# Patient Record
Sex: Female | Born: 1950
Health system: Southern US, Community
[De-identification: ages and names within clinical notes are randomized; demographics above are authoritative.]

## PROBLEM LIST (undated history)

## (undated) DIAGNOSIS — S4290XA Fracture of unspecified shoulder girdle, part unspecified, initial encounter for closed fracture: Secondary | ICD-10-CM

## (undated) DIAGNOSIS — Z8742 Personal history of other diseases of the female genital tract: Secondary | ICD-10-CM

## (undated) DIAGNOSIS — M719 Bursopathy, unspecified: Secondary | ICD-10-CM

## (undated) DIAGNOSIS — E785 Hyperlipidemia, unspecified: Secondary | ICD-10-CM

## (undated) DIAGNOSIS — M1990 Unspecified osteoarthritis, unspecified site: Secondary | ICD-10-CM

## (undated) DIAGNOSIS — R1310 Dysphagia, unspecified: Secondary | ICD-10-CM

## (undated) DIAGNOSIS — G709 Myoneural disorder, unspecified: Secondary | ICD-10-CM

## (undated) DIAGNOSIS — Z905 Acquired absence of kidney: Secondary | ICD-10-CM

## (undated) DIAGNOSIS — I10 Essential (primary) hypertension: Secondary | ICD-10-CM

## (undated) DIAGNOSIS — M797 Fibromyalgia: Secondary | ICD-10-CM

## (undated) DIAGNOSIS — R0600 Dyspnea, unspecified: Secondary | ICD-10-CM

## (undated) DIAGNOSIS — G43909 Migraine, unspecified, not intractable, without status migrainosus: Secondary | ICD-10-CM

## (undated) DIAGNOSIS — G629 Polyneuropathy, unspecified: Secondary | ICD-10-CM

## (undated) DIAGNOSIS — I998 Other disorder of circulatory system: Secondary | ICD-10-CM

## (undated) DIAGNOSIS — F909 Attention-deficit hyperactivity disorder, unspecified type: Secondary | ICD-10-CM

## (undated) DIAGNOSIS — C801 Malignant (primary) neoplasm, unspecified: Secondary | ICD-10-CM

## (undated) DIAGNOSIS — K760 Fatty (change of) liver, not elsewhere classified: Secondary | ICD-10-CM

## (undated) DIAGNOSIS — I6381 Other cerebral infarction due to occlusion or stenosis of small artery: Secondary | ICD-10-CM

## (undated) DIAGNOSIS — Z86718 Personal history of other venous thrombosis and embolism: Secondary | ICD-10-CM

## (undated) DIAGNOSIS — E669 Obesity, unspecified: Secondary | ICD-10-CM

## (undated) DIAGNOSIS — J189 Pneumonia, unspecified organism: Secondary | ICD-10-CM

## (undated) DIAGNOSIS — E1165 Type 2 diabetes mellitus with hyperglycemia: Secondary | ICD-10-CM

## (undated) DIAGNOSIS — E11319 Type 2 diabetes mellitus with unspecified diabetic retinopathy without macular edema: Secondary | ICD-10-CM

## (undated) DIAGNOSIS — N189 Chronic kidney disease, unspecified: Secondary | ICD-10-CM

## (undated) DIAGNOSIS — G4710 Hypersomnia, unspecified: Secondary | ICD-10-CM

## (undated) DIAGNOSIS — H35039 Hypertensive retinopathy, unspecified eye: Secondary | ICD-10-CM

## (undated) DIAGNOSIS — G40909 Epilepsy, unspecified, not intractable, without status epilepticus: Secondary | ICD-10-CM

## (undated) DIAGNOSIS — E039 Hypothyroidism, unspecified: Secondary | ICD-10-CM

## (undated) DIAGNOSIS — F988 Other specified behavioral and emotional disorders with onset usually occurring in childhood and adolescence: Secondary | ICD-10-CM

## (undated) DIAGNOSIS — H269 Unspecified cataract: Secondary | ICD-10-CM

## (undated) DIAGNOSIS — I75029 Atheroembolism of unspecified lower extremity: Principal | ICD-10-CM

## (undated) DIAGNOSIS — Z87898 Personal history of other specified conditions: Secondary | ICD-10-CM

## (undated) DIAGNOSIS — Z87442 Personal history of urinary calculi: Secondary | ICD-10-CM

## (undated) HISTORY — PX: ABDOMINAL HYSTERECTOMY: SHX81

## (undated) HISTORY — DX: Dysphagia, unspecified: R13.10

## (undated) HISTORY — DX: Hyperlipidemia, unspecified: E78.5

## (undated) HISTORY — DX: Attention-deficit hyperactivity disorder, unspecified type: F90.9

## (undated) HISTORY — DX: Obesity, unspecified: E66.9

## (undated) HISTORY — DX: Personal history of other specified conditions: Z87.898

## (undated) HISTORY — DX: Polyneuropathy, unspecified: G62.9

## (undated) HISTORY — DX: Epilepsy, unspecified, not intractable, without status epilepticus: G40.909

## (undated) HISTORY — DX: Personal history of other diseases of the female genital tract: Z87.42

## (undated) HISTORY — PX: NASAL SINUS SURGERY: SHX719

## (undated) HISTORY — DX: Fatty (change of) liver, not elsewhere classified: K76.0

## (undated) HISTORY — DX: Other cerebral infarction due to occlusion or stenosis of small artery: I63.81

## (undated) HISTORY — PX: LYMPH NODE BIOPSY: SHX201

## (undated) HISTORY — DX: Unspecified osteoarthritis, unspecified site: M19.90

## (undated) HISTORY — DX: Other specified behavioral and emotional disorders with onset usually occurring in childhood and adolescence: F98.8

## (undated) HISTORY — DX: Personal history of other venous thrombosis and embolism: Z86.718

## (undated) HISTORY — DX: Hypertensive retinopathy, unspecified eye: H35.039

## (undated) HISTORY — DX: Unspecified cataract: H26.9

## (undated) HISTORY — DX: Essential (primary) hypertension: I10

## (undated) HISTORY — PX: TUBAL LIGATION: SHX77

## (undated) HISTORY — PX: HERNIA REPAIR: SHX51

## (undated) HISTORY — DX: Hypersomnia, unspecified: G47.10

## (undated) HISTORY — DX: Fracture of unspecified shoulder girdle, part unspecified, initial encounter for closed fracture: S42.90XA

## (undated) HISTORY — DX: Type 2 diabetes mellitus with unspecified diabetic retinopathy without macular edema: E11.319

## (undated) HISTORY — DX: Atheroembolism of unspecified lower extremity: I75.029

## (undated) HISTORY — DX: Personal history of urinary calculi: Z87.442

---
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
## 1972-08-22 DIAGNOSIS — M25551 Pain in right hip: Secondary | ICD-10-CM

## 1972-08-22 HISTORY — DX: Pain in right hip: M25.551

## 1998-01-23 ENCOUNTER — Ambulatory Visit (HOSPITAL_COMMUNITY): Admission: RE | Admit: 1998-01-23 | Discharge: 1998-01-23 | Payer: Self-pay | Admitting: Internal Medicine

## 1998-05-20 ENCOUNTER — Other Ambulatory Visit: Admission: RE | Admit: 1998-05-20 | Discharge: 1998-05-20 | Payer: Self-pay | Admitting: Obstetrics and Gynecology

## 1999-06-01 ENCOUNTER — Other Ambulatory Visit: Admission: RE | Admit: 1999-06-01 | Discharge: 1999-06-01 | Payer: Self-pay | Admitting: Obstetrics and Gynecology

## 1999-11-02 ENCOUNTER — Encounter: Admission: RE | Admit: 1999-11-02 | Discharge: 1999-11-02 | Payer: Self-pay | Admitting: Internal Medicine

## 1999-11-02 ENCOUNTER — Encounter: Payer: Self-pay | Admitting: Internal Medicine

## 2000-08-22 HISTORY — PX: NEPHRECTOMY: SHX65

## 2001-01-30 ENCOUNTER — Encounter: Payer: Self-pay | Admitting: Urology

## 2001-01-30 ENCOUNTER — Ambulatory Visit (HOSPITAL_COMMUNITY): Admission: RE | Admit: 2001-01-30 | Discharge: 2001-01-30 | Payer: Self-pay | Admitting: Urology

## 2001-01-31 ENCOUNTER — Encounter: Payer: Self-pay | Admitting: Urology

## 2001-01-31 ENCOUNTER — Ambulatory Visit (HOSPITAL_COMMUNITY): Admission: RE | Admit: 2001-01-31 | Discharge: 2001-01-31 | Payer: Self-pay | Admitting: Urology

## 2001-02-06 ENCOUNTER — Encounter: Payer: Self-pay | Admitting: Family Medicine

## 2001-02-06 ENCOUNTER — Encounter: Admission: RE | Admit: 2001-02-06 | Discharge: 2001-02-06 | Payer: Self-pay | Admitting: Family Medicine

## 2001-04-19 ENCOUNTER — Encounter: Payer: Self-pay | Admitting: *Deleted

## 2001-04-19 ENCOUNTER — Encounter: Admission: RE | Admit: 2001-04-19 | Discharge: 2001-04-19 | Payer: Self-pay | Admitting: *Deleted

## 2001-05-11 ENCOUNTER — Encounter: Payer: Self-pay | Admitting: Urology

## 2001-05-15 ENCOUNTER — Inpatient Hospital Stay (HOSPITAL_COMMUNITY): Admission: RE | Admit: 2001-05-15 | Discharge: 2001-05-17 | Payer: Self-pay | Admitting: Urology

## 2001-11-09 ENCOUNTER — Other Ambulatory Visit: Admission: RE | Admit: 2001-11-09 | Discharge: 2001-11-09 | Payer: Self-pay | Admitting: Obstetrics and Gynecology

## 2002-02-07 ENCOUNTER — Encounter: Payer: Self-pay | Admitting: Obstetrics and Gynecology

## 2002-02-07 ENCOUNTER — Encounter: Admission: RE | Admit: 2002-02-07 | Discharge: 2002-02-07 | Payer: Self-pay | Admitting: Obstetrics and Gynecology

## 2002-05-10 ENCOUNTER — Encounter: Payer: Self-pay | Admitting: Surgery

## 2002-05-17 ENCOUNTER — Inpatient Hospital Stay (HOSPITAL_COMMUNITY): Admission: RE | Admit: 2002-05-17 | Discharge: 2002-05-18 | Payer: Self-pay | Admitting: Surgery

## 2002-11-12 ENCOUNTER — Other Ambulatory Visit: Admission: RE | Admit: 2002-11-12 | Discharge: 2002-11-12 | Payer: Self-pay | Admitting: Obstetrics and Gynecology

## 2003-02-12 ENCOUNTER — Encounter: Admission: RE | Admit: 2003-02-12 | Discharge: 2003-03-11 | Payer: Self-pay | Admitting: Neurosurgery

## 2003-05-30 ENCOUNTER — Encounter: Payer: Self-pay | Admitting: Nephrology

## 2003-05-30 ENCOUNTER — Encounter: Admission: RE | Admit: 2003-05-30 | Discharge: 2003-05-30 | Payer: Self-pay | Admitting: Nephrology

## 2003-11-18 ENCOUNTER — Other Ambulatory Visit: Admission: RE | Admit: 2003-11-18 | Discharge: 2003-11-18 | Payer: Self-pay | Admitting: Obstetrics and Gynecology

## 2003-11-19 ENCOUNTER — Encounter: Admission: RE | Admit: 2003-11-19 | Discharge: 2003-11-19 | Payer: Self-pay | Admitting: Obstetrics and Gynecology

## 2004-01-23 ENCOUNTER — Ambulatory Visit (HOSPITAL_COMMUNITY): Admission: RE | Admit: 2004-01-23 | Discharge: 2004-01-23 | Payer: Self-pay | Admitting: Gastroenterology

## 2004-10-14 ENCOUNTER — Encounter: Admission: RE | Admit: 2004-10-14 | Discharge: 2004-10-14 | Payer: Self-pay | Admitting: Nephrology

## 2004-11-22 ENCOUNTER — Other Ambulatory Visit: Admission: RE | Admit: 2004-11-22 | Discharge: 2004-11-22 | Payer: Self-pay | Admitting: Obstetrics and Gynecology

## 2005-08-23 ENCOUNTER — Encounter: Admission: RE | Admit: 2005-08-23 | Discharge: 2005-10-20 | Payer: Self-pay | Admitting: Internal Medicine

## 2006-05-10 ENCOUNTER — Emergency Department (HOSPITAL_COMMUNITY): Admission: EM | Admit: 2006-05-10 | Discharge: 2006-05-10 | Payer: Self-pay | Admitting: Emergency Medicine

## 2006-08-07 ENCOUNTER — Encounter: Admission: RE | Admit: 2006-08-07 | Discharge: 2006-09-25 | Payer: Self-pay | Admitting: Emergency Medicine

## 2010-11-22 ENCOUNTER — Encounter (INDEPENDENT_AMBULATORY_CARE_PROVIDER_SITE_OTHER): Payer: 59 | Admitting: Surgery

## 2010-11-22 DIAGNOSIS — I743 Embolism and thrombosis of arteries of the lower extremities: Secondary | ICD-10-CM

## 2010-11-25 ENCOUNTER — Other Ambulatory Visit: Payer: Self-pay | Admitting: Surgery

## 2010-11-25 DIAGNOSIS — I743 Embolism and thrombosis of arteries of the lower extremities: Secondary | ICD-10-CM

## 2010-11-26 ENCOUNTER — Other Ambulatory Visit: Payer: 59

## 2010-11-29 ENCOUNTER — Other Ambulatory Visit: Payer: Self-pay | Admitting: Surgery

## 2010-11-29 ENCOUNTER — Ambulatory Visit: Payer: 59 | Admitting: Surgery

## 2010-11-29 DIAGNOSIS — I743 Embolism and thrombosis of arteries of the lower extremities: Secondary | ICD-10-CM

## 2010-11-29 DIAGNOSIS — I7589 Atheroembolism of other site: Secondary | ICD-10-CM

## 2010-11-30 ENCOUNTER — Other Ambulatory Visit: Payer: 59

## 2010-12-02 ENCOUNTER — Other Ambulatory Visit: Payer: Self-pay | Admitting: Surgery

## 2010-12-02 ENCOUNTER — Ambulatory Visit (HOSPITAL_COMMUNITY)
Admission: RE | Admit: 2010-12-02 | Discharge: 2010-12-02 | Disposition: A | Discharge status: Home / Self Care | Payer: 59 | Source: Ambulatory Visit | Attending: Surgery | Admitting: Surgery

## 2010-12-02 ENCOUNTER — Other Ambulatory Visit (HOSPITAL_COMMUNITY): Payer: 59

## 2010-12-02 DIAGNOSIS — I7589 Atheroembolism of other site: Secondary | ICD-10-CM

## 2010-12-02 DIAGNOSIS — I743 Embolism and thrombosis of arteries of the lower extremities: Secondary | ICD-10-CM

## 2010-12-02 DIAGNOSIS — I82819 Embolism and thrombosis of superficial veins of unspecified lower extremities: Secondary | ICD-10-CM | POA: Insufficient documentation

## 2010-12-07 ENCOUNTER — Other Ambulatory Visit: Payer: Self-pay | Admitting: Surgery

## 2010-12-07 DIAGNOSIS — I7589 Atheroembolism of other site: Secondary | ICD-10-CM

## 2010-12-07 DIAGNOSIS — I743 Embolism and thrombosis of arteries of the lower extremities: Secondary | ICD-10-CM

## 2010-12-13 ENCOUNTER — Ambulatory Visit (INDEPENDENT_AMBULATORY_CARE_PROVIDER_SITE_OTHER): Payer: 59 | Admitting: Surgery

## 2010-12-13 DIAGNOSIS — I75029 Atheroembolism of unspecified lower extremity: Secondary | ICD-10-CM

## 2010-12-14 NOTE — Assessment & Plan Note (Signed)
OFFICE VISIT  Debra Jordan, Debra Jordan DOB:  1950/12/25                                       12/13/2010 [id number]#:[id number]  The patient comes back in today for followup.  She is a 59-year female with history of diabetes, hypertension, hypercholesterolemia and hypertriglyceridemia.  In March she had acute change to her left great toe.  It became discolored, painful and numb.  This appeared to be an embolic process and she was started on aspirin and referred to me and in the process of doing complete embolic workup.  She comes back in today to discuss this.  She states that her left toe had nearly resolved and its discoloration however recently has become slightly more red. Occasionally she will have some numbness and pinpoint pain.  PHYSICAL EXAMINATION:  Vital signs:  Heart rate 80, blood pressure 164/104, temperature 97.7.  General:  She is well-appearing, in no distress.  Respirations:  Nonlabored.  Pedal pulses are palpable.  There is a slight redness hue to her left great toe.  There is no evidence of infection.  DIAGNOSTIC STUDIES:  MRA of the chest, abdomen and pelvis and bilateral runoff were performed.  This was done as an MR study rather than CT because of her solitary kidney.  There was no evidence of aneurysm or atherosclerotic plaque which could serve as the source for her toe ischemia.  Echocardiogram was also performed which was within normal limits.  ASSESSMENT:  Embolic episodes of left great toe.  PLAN:  Based on her MRA she does not have evidence of atherosclerotic changes or ulcerations that could be responsible for this.  Her cardiac echo was normal.  Her blood work is still pending at this time.  I told her that it was likely we may not find the exact source.  I think with her high triglycerides and the inability to treat it due to her inability to tolerate medications may be playing a role.  We again discussed that she may lose her toe,  however, I am hopeful that this will not happen.  I am going to see back in 6 weeks to both follow up on her blood work as well as to evaluate her left great toe.    Jorge Ny, MD Electronically Signed  VWB/MEDQ  D:  12/13/2010  T:  12/14/2010  Job:  [id number]  cc:   Gwen Pounds, MD Dr Calla Kicks

## 2010-12-14 NOTE — Assessment & Plan Note (Signed)
OFFICE VISIT  Debra Jordan, Debra Jordan DOB:  08-08-1951                                       12/13/2010 [id number]#:[id number]  The patient comes back in today for followup.  She is a 60 year old female with history of diabetes, hypertension, hypercholesterolemia and hypertriglyceridemia.  She had __________  The end of March.  She had acute change her left great toe became discolored painful and numb, this appeared to be an embolic process and she was started on aspirin and referred to me had no complete in the process of doing it in complete embolic workup, but she comes back in to discuss this.  She had a states that her on the left toe had nearly resolved and it states coloration however, recently has become slightly more red, occasionally she will have had some numbness and pinpoint pain.  On physical examination, heart rate 80 blood pressure 164, 104, temperature is 97.  Seven.  GENERAL:  She is well-appearing, no distress.  Respirations are nonlabored.  Pedal pulses are palpable. There is slight reddish hue to her left great toe.  There is no evidence of infection.  There is.  Diagnostic studies:  MRI of the chest and pelvis and bilateral runoff for performed, this was done as an MR study rather than CT because of her solitary kidney.  There is no evidence of aneurysm or atherosclerotic plaque which could services source for her toe ischemia, echocardiogram was also performed which was within normal limits.  ASSESSMENT/PLAN:  Embolic episodes of left great toe,  PLAN:  Based on her MRA she does not have evidence of atherosclerotic changes or ulcerations.  It could be responsible for for this, her cardiac echo was normal.  Her blood work is still pending at this time. I told that it was like we may not find the exact source I think with her high triglycerides in the inability to treated to her inability to tolerate medications may be playing a role, we again  discussed that she may lose her toe.  However, I am hopeful that this will not happen see back in 6 weeks to both follow up on her blood work as well as to evaluate her left great toe.  Please send a copy of this to Emilia Beck and Dr. bile-stained [facility] night and    V. Charlena Cross, MD Electronically Signed  VWB/MEDQ  D:  12/13/2010  T:  12/14/2010  Job:  [id number]

## 2011-01-24 ENCOUNTER — Ambulatory Visit: Payer: 59 | Admitting: Surgery

## 2011-01-31 ENCOUNTER — Ambulatory Visit (INDEPENDENT_AMBULATORY_CARE_PROVIDER_SITE_OTHER): Payer: 59 | Admitting: Surgery

## 2011-01-31 DIAGNOSIS — M2420 Disorder of ligament, unspecified site: Secondary | ICD-10-CM

## 2011-01-31 DIAGNOSIS — M629 Disorder of muscle, unspecified: Secondary | ICD-10-CM

## 2011-02-01 NOTE — Assessment & Plan Note (Signed)
OFFICE VISIT  NEVAYAH, FAUST DOB:  [birth year]                                       01/31/2011 ZOXWR#:[id number]  The patient comes back today for followup.  Briefly, she is a 60 year old female with history of diabetes, hypertension, hypercholesterolemia and hypertriglyceridemia.  She developed an acute change in her left great toe in March.  It became discolored, painful and numb.  She was referred to me several days later.  This appeared to be embolic.  She had a complete embolic workup done which was negative with the exception of findings of hypertriglyceridemia.  She has been on multiple medications and not been able to tolerate them.  She was not an aspirin when this occurred and she has subsequently been placed on an aspirin. We have been monitoring the status of the left toe based on pain and perfusion to see whether or not she would require amputation.  She comes back today stating that the pain is nearly resolved.  The color of her foot is back to normal.  PHYSICAL EXAMINATION:  Vital signs:  The heart rate is 84, blood pressure 178/122, respiratory rate 20.  General:  She is well-appearing, in no distress.  HEENT:  Within normal limits.  Cardiovascular:  She has a right dorsalis pedis pulse that is palpable.  The left is not however there is some edema in the left foot.  There are no ulcerations.  The left great toe shows normal perfusion.  There is faint erythema/rubor. No open wounds.  ASSESSMENT:  Status post left great toe embolism.  Most likely source of her embolism is due to her hypertriglyceridemia.  She has been very difficult to control from this perspective because she has been intolerant of the medications.  She is now on aspirin.  She was not on an aspirin prior to her event.  She has undergone blood work, MRA, MRI of the pelvis with runoff as well as echo all of which have been negative.  At this point I would recommend  continue with an aspirin and work on diet and exercise to help with her hypertriglyceridemia which is familial.  If she has any other events she will contact me.    Jorge Ny, MD Electronically Signed  VWB/MEDQ  D:  01/31/2011  T:  02/01/2011  Job:  [id number]  cc:   Gwen Pounds, MD

## 2011-03-01 NOTE — Assessment & Plan Note (Signed)
OFFICE VISIT  Debra Jordan, Debra Jordan DOB:  Feb 07, 1951                                       11/22/2010 [id number]#:[id number]  REASON FOR VISIT:  Painful, discolored left great toe.  HISTORY:  This is a very pleasant 60 year old female with a history of medically managed diabetes, hypertension, hypercholesterolemia who presented to the emergency department on this past Thursday with acute change in her left great toe.  It was discolored and painful and became numb.  It was felt that this was an embolic process and she was started on an aspirin and referred to see a vascular surgeon today.  The patient states that the toe does not cause her any pain.  It is discolored from the mid portion out to the tip.  It is numb.  It continues to be a cyanotic color.  REVIEW OF SYSTEMS:  NEUROLOGICAL:  Positive for headaches and seizures. GI:  Positive for trouble swallowing. PSYCHIATRIC:  Positive for attention deficit disorder. SKIN:  Positive for rash in the groin and her legs. All other review of systems are negative as documented in the encounter form.  PAST MEDICAL HISTORY:  Diabetes (age of onset is age 76), hypertension, hypercholesterolemia, gout.  PAST SURGICAL HISTORY:  Nephrectomy, hernia repair, tubal ligation, hysterectomy and tonsillectomy.  SOCIAL HISTORY:  She is widowed with 2 children.  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  FAMILY HISTORY:  Positive for a stroke in her mother from a ruptured intracranial aneurysm.  PHYSICAL EXAMINATION:  Vital signs:  Heart rate 66, blood pressure 167/92, respiratory rate 20.  General:  She is well-appearing, in no distress.  HEENT:  Within normal limits.  Lungs:  Clear bilaterally. Cardiovascular:  Regular rate and rhythm.  She has palpable pedal pulses, palpable femoral pulses.  No prominent bulge in her popliteal space.  Abdomen:  Obese but soft.  Musculoskeletal:  No major deformity. Neurological:   No focal deficits.  DIAGNOSTIC STUDIES:  Ultrasound was performed today.  This shows ABI of 1.1 on the right, 1.1 on the left with triphasic waveforms.  Toe pressure is 134 on the right, 152 on the left; these findings are considered normal.  ASSESSMENT AND PLAN:  Discolored left great toe.  I agree this appears to be an embolic appearing process.  I am recommending that we proceed with a full embolic workup.  I am going to start with a cardiac echo to rule out atrial thrombus.  I am going to get a CT scan of her chest, abdomen and pelvis with bilateral runoff to look for aneurysmal changes or stenosis or focal plaque/dissection.  I will also send off a hypercoagulable blood panel.  I am going to get these done within the next week and see her back next Monday.  I did discuss that there is a possibility she may lose her toe or that it may regain its normal color.    Jorge Ny, MD Electronically Signed  VWB/MEDQ  D:  11/22/2010  T:  11/23/2010  Job:  [id number]  cc:   Gwen Pounds, MD Dr Calla Kicks

## 2011-09-05 ENCOUNTER — Encounter: Payer: Self-pay | Admitting: Surgery

## 2011-09-05 ENCOUNTER — Inpatient Hospital Stay (HOSPITAL_COMMUNITY): Payer: 59

## 2011-09-05 ENCOUNTER — Other Ambulatory Visit: Payer: Self-pay

## 2011-09-05 ENCOUNTER — Ambulatory Visit (INDEPENDENT_AMBULATORY_CARE_PROVIDER_SITE_OTHER): Payer: 59 | Admitting: Surgery

## 2011-09-05 ENCOUNTER — Encounter (HOSPITAL_COMMUNITY): Payer: Self-pay | Admitting: General Practice

## 2011-09-05 ENCOUNTER — Inpatient Hospital Stay (HOSPITAL_COMMUNITY)
Admission: AD | Admit: 2011-09-05 | Discharge: 2011-09-08 | DRG: 300 | Disposition: A | Discharge status: Home / Self Care | Payer: 59 | Source: Ambulatory Visit | Attending: Surgery | Admitting: Surgery

## 2011-09-05 VITALS — BP 163/99 | HR 83 | Temp 98.6°F | Resp 20 | Ht 64.0 in | Wt 207.0 lb

## 2011-09-05 DIAGNOSIS — G4710 Hypersomnia, unspecified: Secondary | ICD-10-CM | POA: Diagnosis present

## 2011-09-05 DIAGNOSIS — G43909 Migraine, unspecified, not intractable, without status migrainosus: Secondary | ICD-10-CM

## 2011-09-05 DIAGNOSIS — E1165 Type 2 diabetes mellitus with hyperglycemia: Secondary | ICD-10-CM

## 2011-09-05 DIAGNOSIS — M79674 Pain in right toe(s): Secondary | ICD-10-CM

## 2011-09-05 DIAGNOSIS — E1151 Type 2 diabetes mellitus with diabetic peripheral angiopathy without gangrene: Secondary | ICD-10-CM | POA: Diagnosis present

## 2011-09-05 DIAGNOSIS — E1159 Type 2 diabetes mellitus with other circulatory complications: Secondary | ICD-10-CM | POA: Diagnosis present

## 2011-09-05 DIAGNOSIS — G40802 Other epilepsy, not intractable, without status epilepticus: Secondary | ICD-10-CM | POA: Diagnosis present

## 2011-09-05 DIAGNOSIS — M109 Gout, unspecified: Secondary | ICD-10-CM | POA: Diagnosis present

## 2011-09-05 DIAGNOSIS — F988 Other specified behavioral and emotional disorders with onset usually occurring in childhood and adolescence: Secondary | ICD-10-CM | POA: Diagnosis present

## 2011-09-05 DIAGNOSIS — Z7982 Long term (current) use of aspirin: Secondary | ICD-10-CM

## 2011-09-05 DIAGNOSIS — IMO0001 Reserved for inherently not codable concepts without codable children: Secondary | ICD-10-CM | POA: Diagnosis present

## 2011-09-05 DIAGNOSIS — Z6830 Body mass index (BMI) 30.0-30.9, adult: Secondary | ICD-10-CM

## 2011-09-05 DIAGNOSIS — Z905 Acquired absence of kidney: Secondary | ICD-10-CM

## 2011-09-05 DIAGNOSIS — E781 Pure hyperglyceridemia: Secondary | ICD-10-CM | POA: Diagnosis present

## 2011-09-05 DIAGNOSIS — E119 Type 2 diabetes mellitus without complications: Secondary | ICD-10-CM | POA: Diagnosis present

## 2011-09-05 DIAGNOSIS — Z86718 Personal history of other venous thrombosis and embolism: Secondary | ICD-10-CM

## 2011-09-05 DIAGNOSIS — E669 Obesity, unspecified: Secondary | ICD-10-CM | POA: Diagnosis present

## 2011-09-05 DIAGNOSIS — I998 Other disorder of circulatory system: Secondary | ICD-10-CM

## 2011-09-05 DIAGNOSIS — I798 Other disorders of arteries, arterioles and capillaries in diseases classified elsewhere: Secondary | ICD-10-CM | POA: Diagnosis present

## 2011-09-05 DIAGNOSIS — E039 Hypothyroidism, unspecified: Secondary | ICD-10-CM | POA: Diagnosis present

## 2011-09-05 DIAGNOSIS — I743 Embolism and thrombosis of arteries of the lower extremities: Principal | ICD-10-CM | POA: Diagnosis present

## 2011-09-05 DIAGNOSIS — Z79899 Other long term (current) drug therapy: Secondary | ICD-10-CM

## 2011-09-05 DIAGNOSIS — M79609 Pain in unspecified limb: Secondary | ICD-10-CM

## 2011-09-05 DIAGNOSIS — I10 Essential (primary) hypertension: Secondary | ICD-10-CM | POA: Diagnosis present

## 2011-09-05 HISTORY — DX: Myoneural disorder, unspecified: G70.9

## 2011-09-05 HISTORY — DX: Type 2 diabetes mellitus with hyperglycemia: E11.65

## 2011-09-05 HISTORY — DX: Other disorder of circulatory system: I99.8

## 2011-09-05 HISTORY — DX: Fibromyalgia: M79.7

## 2011-09-05 HISTORY — DX: Migraine, unspecified, not intractable, without status migrainosus: G43.909

## 2011-09-05 HISTORY — DX: Chronic kidney disease, unspecified: N18.9

## 2011-09-05 HISTORY — DX: Hypothyroidism, unspecified: E03.9

## 2011-09-05 HISTORY — DX: Acquired absence of kidney: Z90.5

## 2011-09-05 HISTORY — DX: Pain in right toe(s): M79.674

## 2011-09-05 LAB — URINALYSIS, ROUTINE W REFLEX MICROSCOPIC
Glucose, UA: 250 mg/dL — AB
Hgb urine dipstick: NEGATIVE
Ketones, ur: NEGATIVE mg/dL
Nitrite: NEGATIVE
Protein, ur: NEGATIVE mg/dL
Specific Gravity, Urine: 1.025 (ref 1.005–1.030)
Urobilinogen, UA: 0.2 mg/dL (ref 0.0–1.0)
pH: 5 (ref 5.0–8.0)

## 2011-09-05 LAB — CBC
HCT: 41.2 % (ref 36.0–46.0)
Hemoglobin: 14.1 g/dL (ref 12.0–15.0)
MCH: 28.7 pg (ref 26.0–34.0)
MCHC: 34.2 g/dL (ref 30.0–36.0)
MCV: 83.9 fL (ref 78.0–100.0)
Platelets: 209 10*3/uL (ref 150–400)
RBC: 4.91 MIL/uL (ref 3.87–5.11)
RDW: 14.1 % (ref 11.5–15.5)
WBC: 10.6 10*3/uL — ABNORMAL HIGH (ref 4.0–10.5)

## 2011-09-05 LAB — GLUCOSE, CAPILLARY
Glucose-Capillary: 163 mg/dL — ABNORMAL HIGH (ref 70–99)
Glucose-Capillary: 263 mg/dL — ABNORMAL HIGH (ref 70–99)

## 2011-09-05 LAB — BASIC METABOLIC PANEL
BUN: 17 mg/dL (ref 6–23)
CO2: 22 mEq/L (ref 19–32)
Calcium: 9.9 mg/dL (ref 8.4–10.5)
Chloride: 107 mEq/L (ref 96–112)
Creatinine, Ser: 1.34 mg/dL — ABNORMAL HIGH (ref 0.50–1.10)
GFR calc Af Amer: 50 mL/min — ABNORMAL LOW (ref >90)
GFR calc non Af Amer: 43 mL/min — ABNORMAL LOW (ref >90)
Glucose, Bld: 170 mg/dL — ABNORMAL HIGH (ref 70–99)
Potassium: 3.5 mEq/L (ref 3.5–5.1)
Sodium: 141 mEq/L (ref 135–145)

## 2011-09-05 LAB — URINE MICROSCOPIC-ADD ON

## 2011-09-05 MED ORDER — POTASSIUM CHLORIDE CRYS ER 20 MEQ PO TBCR
20.0000 meq | EXTENDED_RELEASE_TABLET | Freq: Once | ORAL | Status: DC
  Filled 2011-09-05: qty 1

## 2011-09-05 MED ORDER — EXENATIDE 5 MCG/0.02ML ~~LOC~~ SOPN: 5.0000 ug | PEN_INJECTOR | Freq: Every day | SUBCUTANEOUS | Status: DC

## 2011-09-05 MED ORDER — FAMOTIDINE IN NACL 20-0.9 MG/50ML-% IV SOLN
20.0000 mg | Freq: Two times a day (BID) | INTRAVENOUS | Status: DC
  Administered 2011-09-05 – 2011-09-07 (×4): 20 mg via INTRAVENOUS
  Filled 2011-09-05 (×5): qty 50

## 2011-09-05 MED ORDER — GLYBURIDE 2.5 MG PO TABS
2.5000 mg | ORAL_TABLET | Freq: Two times a day (BID) | ORAL | Status: DC
  Administered 2011-09-05 – 2011-09-08 (×7): 2.5 mg via ORAL
  Filled 2011-09-05 (×8): qty 1

## 2011-09-05 MED ORDER — GLYBURIDE 2.5 MG PO TABS
2.5000 mg | ORAL_TABLET | Freq: Two times a day (BID) | ORAL | Status: DC
  Filled 2011-09-05: qty 1

## 2011-09-05 MED ORDER — POTASSIUM CHLORIDE CRYS ER 20 MEQ PO TBCR
20.0000 meq | EXTENDED_RELEASE_TABLET | Freq: Once | ORAL | Status: AC
  Administered 2011-09-05: 20 meq via ORAL

## 2011-09-05 MED ORDER — HEPARIN SOD (PORCINE) IN D5W 100 UNIT/ML IV SOLN
1650.0000 [IU]/h | INTRAVENOUS | Status: DC
  Administered 2011-09-05: 1150 [IU]/h via INTRAVENOUS
  Administered 2011-09-06: 1350 [IU]/h via INTRAVENOUS
  Administered 2011-09-07: 1650 [IU]/h via INTRAVENOUS
  Administered 2011-09-07: 1500 [IU]/h via INTRAVENOUS
  Filled 2011-09-05 (×8): qty 250

## 2011-09-05 MED ORDER — LABETALOL HCL 5 MG/ML IV SOLN
10.0000 mg | INTRAVENOUS | Status: DC | PRN
  Filled 2011-09-05: qty 4

## 2011-09-05 MED ORDER — OXYCODONE-ACETAMINOPHEN 5-325 MG PO TABS
1.0000 | ORAL_TABLET | ORAL | Status: DC | PRN
  Administered 2011-09-08: 1 via ORAL
  Filled 2011-09-05: qty 1

## 2011-09-05 MED ORDER — ATENOLOL 100 MG PO TABS
100.0000 mg | ORAL_TABLET | Freq: Every day | ORAL | Status: DC
  Administered 2011-09-05: 100 mg via ORAL
  Filled 2011-09-05 (×2): qty 1

## 2011-09-05 MED ORDER — GUAIFENESIN-DM 100-10 MG/5ML PO SYRP: 15.0000 mL | ORAL_SOLUTION | ORAL | Status: DC | PRN

## 2011-09-05 MED ORDER — OLMESARTAN MEDOXOMIL 40 MG PO TABS
40.0000 mg | ORAL_TABLET | Freq: Every day | ORAL | Status: DC
  Administered 2011-09-05 – 2011-09-08 (×4): 40 mg via ORAL
  Filled 2011-09-05 (×4): qty 1

## 2011-09-05 MED ORDER — ONDANSETRON HCL 4 MG/2ML IJ SOLN
4.0000 mg | Freq: Four times a day (QID) | INTRAMUSCULAR | Status: DC | PRN
  Administered 2011-09-06: 4 mg via INTRAVENOUS
  Filled 2011-09-05: qty 2

## 2011-09-05 MED ORDER — CYCLOBENZAPRINE HCL 10 MG PO TABS
10.0000 mg | ORAL_TABLET | Freq: Every day | ORAL | Status: DC
  Filled 2011-09-05 (×4): qty 1

## 2011-09-05 MED ORDER — HYDRALAZINE HCL 20 MG/ML IJ SOLN
10.0000 mg | INTRAMUSCULAR | Status: DC | PRN
  Filled 2011-09-05: qty 0.5

## 2011-09-05 MED ORDER — INSULIN ASPART 100 UNIT/ML ~~LOC~~ SOLN
0.0000 [IU] | Freq: Three times a day (TID) | SUBCUTANEOUS | Status: DC
  Administered 2011-09-06: 3 [IU] via SUBCUTANEOUS
  Administered 2011-09-06: 5 [IU] via SUBCUTANEOUS
  Administered 2011-09-06: 8 [IU] via SUBCUTANEOUS
  Administered 2011-09-07: 5 [IU] via SUBCUTANEOUS
  Administered 2011-09-07: 2 [IU] via SUBCUTANEOUS
  Administered 2011-09-07: 5 [IU] via SUBCUTANEOUS
  Administered 2011-09-08: 2 [IU] via SUBCUTANEOUS
  Filled 2011-09-05: qty 3

## 2011-09-05 MED ORDER — TOPIRAMATE 15 MG PO CPSP: 45.0000 mg | ORAL_CAPSULE | Freq: Every evening | ORAL | Status: DC

## 2011-09-05 MED ORDER — TOPIRAMATE 15 MG PO CPSP
45.0000 mg | ORAL_CAPSULE | Freq: Every day | ORAL | Status: DC
  Administered 2011-09-07: 45 mg via ORAL
  Filled 2011-09-05 (×6): qty 3

## 2011-09-05 MED ORDER — EXENATIDE 5 MCG/0.02ML ~~LOC~~ SOPN
5.0000 ug | PEN_INJECTOR | Freq: Every day | SUBCUTANEOUS | Status: DC
  Administered 2011-09-08: 5 ug via SUBCUTANEOUS
  Filled 2011-09-05 (×4): qty 0.02

## 2011-09-05 MED ORDER — HEPARIN BOLUS VIA INFUSION
4000.0000 [IU] | Freq: Once | INTRAVENOUS | Status: AC
  Administered 2011-09-05: 4000 [IU] via INTRAVENOUS
  Filled 2011-09-05: qty 4000

## 2011-09-05 MED ORDER — METOPROLOL TARTRATE 1 MG/ML IV SOLN: 2.0000 mg | INTRAVENOUS | Status: DC | PRN

## 2011-09-05 MED ORDER — LEVOTHYROXINE SODIUM 150 MCG PO TABS
150.0000 ug | ORAL_TABLET | ORAL | Status: DC
  Administered 2011-09-06 – 2011-09-08 (×3): 150 ug via ORAL
  Filled 2011-09-05 (×4): qty 1

## 2011-09-05 MED ORDER — PHENOL 1.4 % MT LIQD
1.0000 | OROMUCOSAL | Status: DC | PRN
  Filled 2011-09-05: qty 177

## 2011-09-05 NOTE — Progress Notes (Signed)
Addended by: Holley Raring on: 09/05/2011 02:03 PM   Modules accepted: Orders

## 2011-09-05 NOTE — Progress Notes (Signed)
ANTICOAGULATION CONSULT NOTE - Initial Consult  Pharmacy Consult for Heparin Indication: Embolic dz (toes)  Allergies  Allergen Reactions  . Penicillins   . Statins   . Sulfa Antibiotics   . Latex Rash    Occasional SOB    Patient Measurements:   Adjusted Body Weight: =76kg  Vital Signs: Temp: 98.7 F (37.1 C) (01/14 1540) Temp src: Oral (01/14 1540) BP: 149/97 mmHg (01/14 1543) Pulse Rate: 83  (01/14 1540)  Labs: No results found for this basename: HGB:2,HCT:3,PLT:3,APTT:3,LABPROT:3,INR:3,HEPARINUNFRC:3,CREATININE:3,CKTOTAL:3,CKMB:3,TROPONINI:3 in the last 72 hours CrCl is unknown because no creatinine reading has been taken.  Medical History: Past Medical History  Diagnosis Date  . Diabetes mellitus   . Hypertension   . Hyperlipidemia   . Arthritis     gout  . ADD (attention deficit disorder)   . History of headache   . Trouble swallowing   . Seizure disorder   . History of venous thrombosis and embolism     Left great toe    Medications:  Prescriptions prior to admission  Medication Sig Dispense Refill  . aspirin 325 MG tablet Take 325 mg by mouth daily.      Marland Kitchen atenolol (TENORMIN) 100 MG tablet Take 100 mg by mouth daily.      . cyclobenzaprine (FLEXERIL) 10 MG tablet Take 10 mg by mouth at bedtime.      Marland Kitchen exenatide (BYETTA) 5 MCG/0.02ML SOLN Inject 5 mcg into the skin daily.      Marland Kitchen glyBURIDE (DIABETA) 2.5 MG tablet Take 2.5 mg by mouth daily with breakfast.      . levothyroxine (SYNTHROID, LEVOTHROID) 150 MCG tablet Take 150 mcg by mouth daily.      . metFORMIN (GLUCOPHAGE) 1000 MG tablet Take 1,000 mg by mouth 2 (two) times daily with a meal.      . telmisartan (MICARDIS) 80 MG tablet Take 80 mg by mouth daily.      Marland Kitchen topiramate (TOPAMAX) 15 MG capsule Take 15 mg by mouth 3 (three) times daily.        Assessment: Admit Complaint:  Anticoagulation: 61 y/o female with 2nd episode of embolic dz (Left great toe 6/12) now in right 2nd toe. First embolic  dz workup was negative. Start IV heparin this admission for repeat embolic episode.  Cardiovascular: HTN/HLD  Endocrinology: DM  Gastrointestinal / Nutrition h/o trouble swallowing  Neurology: Seizure d/o, migraines, ADD  Nephrology: single kidney   Goal of Therapy:  Heparin level 0.3-0.7 units/ml   Plan:  F/u to resume home medications  Heparin 4000 unit IV bolus and 1150 units/hr infusion. Check heparin level in 6-8 hrs and daily.    Merilynn Finland, [employer] 09/05/2011,3:59 PM

## 2011-09-05 NOTE — Progress Notes (Signed)
[facility] [place]  Patient name: Debra Jordan MRN: [id number] DOB: 11/28/1950 Sex: female  Chief Complaint   Patient presents with   .  Re-evaluation     discolored right 2nd toe REF- Dr. Russo No injury, started last night.    HISTORY OF PRESENT ILLNESS:  The patient comes in today for evaluation of a painful discolored right second toe. This began yesterday. There were no aggravating or relieving factors. The patient has a history of a similar episode to the left great toe back in June. She underwent a full embolic workup which included blood work an echo and imaging of her thoracic abdominal aorta and lower extremities. Her entire workup was negative. She was not on aspirin when this occurs she was transferred to and aspirin. Her right toe has become less discolored and pain has become intermittent. The only issue and her hypercoagulable workup for his elevated triglycerides. Her hypertriglyceridemia has been difficult to control because she has been intolerant of medications. She is trying to exercise to help improve her triglyceride level which haven't decreased since her last visit with me.  The patient states that for the past several months she has had a change in her migraine headaches. She is now back on her Topamax. She continues to be medically managed for diabetes hypertension and hyperlipidemia.  Past Medical History   Diagnosis  Date   .  Diabetes mellitus    .  Hypertension    .  Hyperlipidemia    .  Arthritis      gout   .  ADD (attention deficit disorder)    .  History of headache    .  Trouble swallowing    .  Seizure disorder    .  History of venous thrombosis and embolism      Left great toe    Past Surgical History   Procedure  Date   .  Nephrectomy      left nephrectomy   .  Hernia repair    .  Tubal ligation    .  Abdominal hysterectomy    .  Tonsillectomy    .  Lymph node biopsy     History    Social History   .  Marital Status:   Widowed     Spouse Name:  N/A     Number of Children:  N/A   .  Years of Education:  N/A    Occupational History   .  Not on file.    Social History Main Topics   .  Smoking status:  Never Smoker   .  Smokeless tobacco:  Not on file   .  Alcohol Use:  No   .  Drug Use:  No   .  Sexually Active:     Other Topics  Concern   .  Not on file    Social History Narrative   .  No narrative on file    Family History   Problem  Relation  Age of Onset   .  Stroke  Mother       ruptured intracranial aneurysm    .  Other  Mother       blood clot history, varicose veins    .  Cancer  Father    .  Stroke  Maternal Grandmother     Allergies as of 09/05/2011 - Review Complete 09/05/2011   Allergen  Reaction  Noted   .  Penicillins     09/05/2011   .  Statins   09/05/2011   .  Sulfa antibiotics   09/05/2011   .  Latex  Rash  09/05/2011    Current Outpatient Prescriptions on File Prior to Visit   Medication  Sig  Dispense  Refill   .  aspirin 325 MG tablet  Take 325 mg by mouth daily.     .  atenolol (TENORMIN) 100 MG tablet  Take 100 mg by mouth daily.     .  exenatide (BYETTA) 5 MCG/0.02ML SOLN  Inject 5 mcg into the skin daily.     .  glyBURIDE (DIABETA) 2.5 MG tablet  Take 2.5 mg by mouth daily with breakfast.     .  levothyroxine (SYNTHROID, LEVOTHROID) 150 MCG tablet  Take 150 mcg by mouth daily.     .  metFORMIN (GLUCOPHAGE) 1000 MG tablet  Take 1,000 mg by mouth 2 (two) times daily with a meal.     .  cyclobenzaprine (FLEXERIL) 10 MG tablet  Take 10 mg by mouth at bedtime.     .  telmisartan (MICARDIS) 80 MG tablet  Take 80 mg by mouth daily.      REVIEW OF SYSTEMS:  Negative except as mentioned in the history of present illness  PHYSICAL EXAMINATION:  Vital signs are BP 163/99  Pulse 83  Temp(Src) 98.6 F (37 C) (Oral)  Resp 20  Ht 5' 4" (1.626 m)  Wt 207 lb (93.895 kg)  BMI 35.53 kg/m2  SpO2 98%  General: The patient appears their stated age.  HEENT: No gross  abnormalities  Pulmonary: Non labored breathing  Abdomen: Soft and non-tender  Musculoskeletal: There are no major deformities.  Neurologic: No focal weakness or paresthesias are detected,  Skin: There are no ulcer or rashes noted. The right second toe is discolored and tender. There is decreased sensation  Psychiatric: The patient has normal affect.  Cardiovascular: There is a regular rate and rhythm without significant murmur appreciated. Palpable pedal pulse  Diagnostic Studies  None  Assessment:  Ischemic right second toe  Plan:  This is the patient's second embolic event within the past 6-8 months. She is undergone a full embolic workup which has been negative with the exception of hypertriglyceridemia which he is treating. Because of the recent onset of her symptoms as well as the fact that this is her second episode, this time she is on antiplatelet therapy I think that she needs to be admitted to the hospital for for anti-coagulation. While in the hospital I obtain a CT scan of her chest abdomen and pelvis. This will have to be done without contrast because of her solitary kidney. She has had an MRI in the past however MRI that grossly there is a poor job of taking calcium. With these images healthy looking for a heavily calcified area that could be the source of her embolism. I am also going to order a cardiac echo with a bubble study to rule out a patent foramen ovale. We will consult hematology as well  V. Wells Darl Brisbin IV, M.D.  [facility] [place]  Office: [phone]  Pager: [phone]  

## 2011-09-05 NOTE — H&P (Signed)
[facility] [place]  Patient name: Debra Jordan MRN: [id number] DOB: 12/31/1950 Sex: female  Chief Complaint   Patient presents with   .  Re-evaluation     discolored right 2nd toe REF- Dr. Timothy Lasso No injury, started last night.    HISTORY OF PRESENT ILLNESS:  The patient comes in today for evaluation of a painful discolored right second toe. This began yesterday. There were no aggravating or relieving factors. The patient has a history of a similar episode to the left great toe back in June. She underwent a full embolic workup which included blood work an echo and imaging of her thoracic abdominal aorta and lower extremities. Her entire workup was negative. She was not on aspirin when this occurs she was transferred to and aspirin. Her right toe has become less discolored and pain has become intermittent. The only issue and her hypercoagulable workup for his elevated triglycerides. Her hypertriglyceridemia has been difficult to control because she has been intolerant of medications. She is trying to exercise to help improve her triglyceride level which haven't decreased since her last visit with me.  The patient states that for the past several months she has had a change in her migraine headaches. She is now back on her Topamax. She continues to be medically managed for diabetes hypertension and hyperlipidemia.  Past Medical History   Diagnosis  Date   .  Diabetes mellitus    .  Hypertension    .  Hyperlipidemia    .  Arthritis      gout   .  ADD (attention deficit disorder)    .  History of headache    .  Trouble swallowing    .  Seizure disorder    .  History of venous thrombosis and embolism      Left great toe    Past Surgical History   Procedure  Date   .  Nephrectomy      left nephrectomy   .  Hernia repair    .  Tubal ligation    .  Abdominal hysterectomy    .  Tonsillectomy    .  Lymph node biopsy     History    Social History   .  Marital Status:   Widowed     Spouse Name:  N/A     Number of Children:  N/A   .  Years of Education:  N/A    Occupational History   .  Not on file.    Social History Main Topics   .  Smoking status:  Never Smoker   .  Smokeless tobacco:  Not on file   .  Alcohol Use:  No   .  Drug Use:  No   .  Sexually Active:     Other Topics  Concern   .  Not on file    Social History Narrative   .  No narrative on file    Family History   Problem  Relation  Age of Onset   .  Stroke  Mother       ruptured intracranial aneurysm    .  Other  Mother       blood clot history, varicose veins    .  Cancer  Father    .  Stroke  Maternal Grandmother     Allergies as of 09/05/2011 - Review Complete 09/05/2011   Allergen  Reaction  Noted   .  Penicillins  09/05/2011   .  Statins   09/05/2011   .  Sulfa antibiotics   09/05/2011   .  Latex  Rash  09/05/2011    Current Outpatient Prescriptions on File Prior to Visit   Medication  Sig  Dispense  Refill   .  aspirin 325 MG tablet  Take 325 mg by mouth daily.     Marland Kitchen  atenolol (TENORMIN) 100 MG tablet  Take 100 mg by mouth daily.     Marland Kitchen  exenatide (BYETTA) 5 MCG/0.02ML SOLN  Inject 5 mcg into the skin daily.     Marland Kitchen  glyBURIDE (DIABETA) 2.5 MG tablet  Take 2.5 mg by mouth daily with breakfast.     .  levothyroxine (SYNTHROID, LEVOTHROID) 150 MCG tablet  Take 150 mcg by mouth daily.     .  metFORMIN (GLUCOPHAGE) 1000 MG tablet  Take 1,000 mg by mouth 2 (two) times daily with a meal.     .  cyclobenzaprine (FLEXERIL) 10 MG tablet  Take 10 mg by mouth at bedtime.     Marland Kitchen  telmisartan (MICARDIS) 80 MG tablet  Take 80 mg by mouth daily.      REVIEW OF SYSTEMS:  Negative except as mentioned in the history of present illness  PHYSICAL EXAMINATION:  Vital signs are BP 163/99  Pulse 83  Temp(Src) 98.6 F (37 C) (Oral)  Resp 20  Ht 5\' 4"  (1.626 m)  Wt 207 lb (93.895 kg)  BMI 35.53 kg/m2  SpO2 98%  General: The patient appears their stated age.  HEENT: No gross  abnormalities  Pulmonary: Non labored breathing  Abdomen: Soft and non-tender  Musculoskeletal: There are no major deformities.  Neurologic: No focal weakness or paresthesias are detected,  Skin: There are no ulcer or rashes noted. The right second toe is discolored and tender. There is decreased sensation  Psychiatric: The patient has normal affect.  Cardiovascular: There is a regular rate and rhythm without significant murmur appreciated. Palpable pedal pulse  Diagnostic Studies  None  Assessment:  Ischemic right second toe  Plan:  This is the patient's second embolic event within the past 6-8 months. She is undergone a full embolic workup which has been negative with the exception of hypertriglyceridemia which he is treating. Because of the recent onset of her symptoms as well as the fact that this is her second episode, this time she is on antiplatelet therapy I think that she needs to be admitted to the hospital for for anti-coagulation. While in the hospital I obtain a CT scan of her chest abdomen and pelvis. This will have to be done without contrast because of her solitary kidney. She has had an MRI in the past however MRI that grossly there is a poor job of taking calcium. With these images healthy looking for a heavily calcified area that could be the source of her embolism. I am also going to order a cardiac echo with a bubble study to rule out a patent foramen ovale. We will consult hematology as well  V. Charlena Cross, M.D.  [facility] [place]  Office: [phone]  Pager: [phone]

## 2011-09-06 ENCOUNTER — Encounter (HOSPITAL_COMMUNITY): Payer: Self-pay | Admitting: Oncology

## 2011-09-06 DIAGNOSIS — G43909 Migraine, unspecified, not intractable, without status migrainosus: Secondary | ICD-10-CM

## 2011-09-06 DIAGNOSIS — E039 Hypothyroidism, unspecified: Secondary | ICD-10-CM

## 2011-09-06 DIAGNOSIS — I10 Essential (primary) hypertension: Secondary | ICD-10-CM

## 2011-09-06 DIAGNOSIS — E119 Type 2 diabetes mellitus without complications: Secondary | ICD-10-CM

## 2011-09-06 DIAGNOSIS — I999 Unspecified disorder of circulatory system: Secondary | ICD-10-CM

## 2011-09-06 DIAGNOSIS — Z905 Acquired absence of kidney: Secondary | ICD-10-CM

## 2011-09-06 DIAGNOSIS — E781 Pure hyperglyceridemia: Secondary | ICD-10-CM | POA: Diagnosis present

## 2011-09-06 DIAGNOSIS — M109 Gout, unspecified: Secondary | ICD-10-CM

## 2011-09-06 DIAGNOSIS — G459 Transient cerebral ischemic attack, unspecified: Secondary | ICD-10-CM

## 2011-09-06 DIAGNOSIS — E1151 Type 2 diabetes mellitus with diabetic peripheral angiopathy without gangrene: Secondary | ICD-10-CM | POA: Diagnosis present

## 2011-09-06 DIAGNOSIS — E1165 Type 2 diabetes mellitus with hyperglycemia: Secondary | ICD-10-CM

## 2011-09-06 DIAGNOSIS — I998 Other disorder of circulatory system: Secondary | ICD-10-CM

## 2011-09-06 HISTORY — DX: Other disorder of circulatory system: I99.8

## 2011-09-06 HISTORY — DX: Acquired absence of kidney: Z90.5

## 2011-09-06 HISTORY — DX: Type 2 diabetes mellitus with hyperglycemia: E11.65

## 2011-09-06 HISTORY — DX: Gout, unspecified: M10.9

## 2011-09-06 HISTORY — DX: Migraine, unspecified, not intractable, without status migrainosus: G43.909

## 2011-09-06 HISTORY — DX: Hypothyroidism, unspecified: E03.9

## 2011-09-06 HISTORY — DX: Type 2 diabetes mellitus without complications: E11.9

## 2011-09-06 HISTORY — DX: Pure hyperglyceridemia: E78.1

## 2011-09-06 HISTORY — DX: Essential (primary) hypertension: I10

## 2011-09-06 LAB — CBC
HCT: 39.9 % (ref 36.0–46.0)
HCT: 38.7 % (ref 36.0–46.0)
Hemoglobin: 13.7 g/dL (ref 12.0–15.0)
Hemoglobin: 13.6 g/dL (ref 12.0–15.0)
MCH: 28.8 pg (ref 26.0–34.0)
MCH: 29.5 pg (ref 26.0–34.0)
MCHC: 34.3 g/dL (ref 30.0–36.0)
MCHC: 35.1 g/dL (ref 30.0–36.0)
MCV: 83.8 fL (ref 78.0–100.0)
MCV: 83.9 fL (ref 78.0–100.0)
Platelets: 187 10*3/uL (ref 150–400)
Platelets: 197 10*3/uL (ref 150–400)
RBC: 4.76 MIL/uL (ref 3.87–5.11)
RBC: 4.61 MIL/uL (ref 3.87–5.11)
RDW: 14.1 % (ref 11.5–15.5)
RDW: 14.1 % (ref 11.5–15.5)
WBC: 9.2 10*3/uL (ref 4.0–10.5)
WBC: 8.6 10*3/uL (ref 4.0–10.5)

## 2011-09-06 LAB — GLUCOSE, CAPILLARY
Glucose-Capillary: 190 mg/dL — ABNORMAL HIGH (ref 70–99)
Glucose-Capillary: 272 mg/dL — ABNORMAL HIGH (ref 70–99)
Glucose-Capillary: 234 mg/dL — ABNORMAL HIGH (ref 70–99)
Glucose-Capillary: 190 mg/dL — ABNORMAL HIGH (ref 70–99)

## 2011-09-06 LAB — LACTATE DEHYDROGENASE: LDH: 215 U/L (ref 94–250)

## 2011-09-06 LAB — HEPARIN LEVEL (UNFRACTIONATED)
Heparin Unfractionated: 0.18 IU/mL — ABNORMAL LOW (ref 0.30–0.70)
Heparin Unfractionated: 0.31 IU/mL (ref 0.30–0.70)
Heparin Unfractionated: 0.19 IU/mL — ABNORMAL LOW (ref 0.30–0.70)
Heparin Unfractionated: 0.3 IU/mL (ref 0.30–0.70)

## 2011-09-06 LAB — DIFFERENTIAL
Basophils Absolute: 0.1 10*3/uL (ref 0.0–0.1)
Basophils Relative: 1 % (ref 0–1)
Eosinophils Absolute: 0.2 10*3/uL (ref 0.0–0.7)
Eosinophils Relative: 3 % (ref 0–5)
Lymphocytes Relative: 27 % (ref 12–46)
Lymphs Abs: 2.3 10*3/uL (ref 0.7–4.0)
Monocytes Absolute: 0.7 10*3/uL (ref 0.1–1.0)
Monocytes Relative: 8 % (ref 3–12)
Neutro Abs: 5.3 10*3/uL (ref 1.7–7.7)
Neutrophils Relative %: 62 % (ref 43–77)

## 2011-09-06 LAB — RETICULOCYTES
RBC.: 4.61 MIL/uL (ref 3.87–5.11)
Retic Count, Absolute: 106 10*3/uL (ref 19.0–186.0)
Retic Ct Pct: 2.3 % (ref 0.4–3.1)

## 2011-09-06 LAB — HEMOGLOBIN A1C
Hgb A1c MFr Bld: 8.4 % — ABNORMAL HIGH (ref <5.7)
Hgb A1c MFr Bld: 8.5 % — ABNORMAL HIGH (ref <5.7)
Mean Plasma Glucose: 194 mg/dL — ABNORMAL HIGH (ref <117)
Mean Plasma Glucose: 197 mg/dL — ABNORMAL HIGH (ref <117)

## 2011-09-06 LAB — APTT: aPTT: 45 seconds — ABNORMAL HIGH (ref 24–37)

## 2011-09-06 LAB — PROTIME-INR
INR: 1.02 (ref 0.00–1.49)
Prothrombin Time: 13.6 seconds (ref 11.6–15.2)

## 2011-09-06 LAB — SEDIMENTATION RATE: Sed Rate: 16 mm/hr (ref 0–22)

## 2011-09-06 MED ORDER — WARFARIN SODIUM 7.5 MG PO TABS
7.5000 mg | ORAL_TABLET | Freq: Once | ORAL | Status: DC
  Filled 2011-09-06: qty 1

## 2011-09-06 MED ORDER — COUMADIN BOOK
Freq: Once | Status: AC
  Administered 2011-09-06: 16:00:00
  Filled 2011-09-06: qty 1

## 2011-09-06 MED ORDER — INSULIN PEN STARTER KIT
1.0000 | Freq: Once | Status: AC
  Administered 2011-09-06: 1
  Filled 2011-09-06 (×2): qty 1

## 2011-09-06 MED ORDER — TOPIRAMATE 25 MG PO TABS
50.0000 mg | ORAL_TABLET | Freq: Once | ORAL | Status: AC
  Administered 2011-09-06: 50 mg via ORAL
  Filled 2011-09-06: qty 2

## 2011-09-06 MED ORDER — WARFARIN VIDEO
Freq: Once | Status: AC
  Administered 2011-09-06: 16:00:00

## 2011-09-06 MED ORDER — FENOFIBRATE 54 MG PO TABS
54.0000 mg | ORAL_TABLET | Freq: Every day | ORAL | Status: DC
  Administered 2011-09-06 – 2011-09-07 (×2): 54 mg via ORAL
  Filled 2011-09-06 (×3): qty 1

## 2011-09-06 MED ORDER — CARVEDILOL 12.5 MG PO TABS
12.5000 mg | ORAL_TABLET | Freq: Two times a day (BID) | ORAL | Status: DC
  Administered 2011-09-06 – 2011-09-07 (×3): 12.5 mg via ORAL
  Filled 2011-09-06 (×5): qty 1

## 2011-09-06 MED ORDER — METFORMIN HCL 500 MG PO TABS
1000.0000 mg | ORAL_TABLET | Freq: Two times a day (BID) | ORAL | Status: DC
  Administered 2011-09-06 – 2011-09-08 (×4): 1000 mg via ORAL
  Filled 2011-09-06 (×6): qty 2

## 2011-09-06 MED ORDER — LIVING WELL WITH DIABETES BOOK
Freq: Once | Status: AC
  Administered 2011-09-06: 16:00:00
  Filled 2011-09-06 (×2): qty 1

## 2011-09-06 MED ORDER — HEPARIN BOLUS VIA INFUSION
2000.0000 [IU] | Freq: Once | INTRAVENOUS | Status: AC
  Administered 2011-09-06: 2000 [IU] via INTRAVENOUS
  Filled 2011-09-06: qty 2000

## 2011-09-06 MED ORDER — AMLODIPINE BESYLATE 2.5 MG PO TABS
2.5000 mg | ORAL_TABLET | Freq: Every day | ORAL | Status: DC
  Administered 2011-09-06: 2.5 mg via ORAL
  Filled 2011-09-06 (×2): qty 1

## 2011-09-06 MED ORDER — INSULIN GLARGINE 100 UNIT/ML ~~LOC~~ SOLN
10.0000 [IU] | SUBCUTANEOUS | Status: DC
  Administered 2011-09-06 – 2011-09-07 (×2): 10 [IU] via SUBCUTANEOUS
  Filled 2011-09-06 (×2): qty 3

## 2011-09-06 NOTE — Progress Notes (Signed)
  Echocardiogram 2D Echocardiogram has been performed.  Debra Jordan 09/06/2011, 12:37 PM

## 2011-09-06 NOTE — Progress Notes (Signed)
Inpatient Diabetes Program Recommendations  AACE/ADA: New Consensus Statement on Inpatient Glycemic Control (2009)  Target Ranges:  Prepandial:   less than 140 mg/dL      Peak postprandial:   less than 180 mg/dL (1-2 hours)      Critically ill patients:  140 - 180 mg/dL   Reason for Visit: Consult.    Inpatient Diabetes Program Recommendations Insulin - Meal Coverage: Add 4 units with meals Patient's post-prandials are elevated and patient said that was usually the pattern at home as well.    Note: spoke with patient concerning A1C=8.4 and new to insulin.  Patient expresses difficulty over the years of managing glucose.  Patient is accepting of starting insulin.  Patient is concerned about cost and says that her insurance is not very helpful.  Patient does not understand carb counting very well.  Patient will read carb basics tonight and discuss questions tomorrow.  Patient is currently watching DM videos on patient ed network.   Thank you! Piedad Climes RN, BSN, CDE

## 2011-09-06 NOTE — Progress Notes (Signed)
ANTICOAGULATION CONSULT NOTE - Follow Up Consult  Pharmacy Consult for coumadin + heparin Indication: recurrent ischemic toe  Vital Signs: Temp: 97.9 F (36.6 C) (01/15 1300) Temp src: Oral (01/15 1146) BP: 139/92 mmHg (01/15 1300) Pulse Rate: 73  (01/15 1300)  Labs:  Basename 09/06/11 1358 09/06/11 0823 09/05/11 2319 09/05/11 1632  HGB 13.6 13.7 -- --  HCT 38.7 39.9 -- 41.2  PLT 197 187 -- 209  APTT 45* -- -- --  LABPROT -- -- -- --  INR -- -- -- --  HEPARINUNFRC 0.19* 0.31 0.18* --  CREATININE -- -- -- 1.34*  CKTOTAL -- -- -- --  CKMB -- -- -- --  TROPONINI -- -- -- --   Estimated Creatinine Clearance: 50.9 ml/min (by C-G formula based on Cr of 1.34).  Medications:  Infusions:    . heparin 1,350 Units/hr (09/06/11 0107)   Assessment: 60 yof on IV heparin for an ischemic toe. Coumadin is also to start tonight since this is a recurrent event. Heparin level is now subtherapeutic. No baseline INR is available.   Goal of Therapy:  INR 2-3 Heparin level 0.3-0.7 units/ml   Plan:  1. Increase heparin gtt to 1500units/hr 2. Check 6 hour heparin level 3. STAT INR 4. If INR is WNL, coumadin 7.5mg  PO x 1 tonight 5. Daily INR 6. Coumadin education materials  Sameeha Rockefeller, Drake Leach 09/06/2011,3:06 PM

## 2011-09-06 NOTE — Progress Notes (Signed)
[facility]  Subjective  -   The patient had no acute events overnight. She states that she has had no changes in her right second toe. She denies chest pain she denies shortness of breath she denies visual changes she denies abdominal pain   Physical Exam:  Right second toe without significant changes. Abdomen is soft respirations are nonlabored   Negative bubble study.  CT scan without contrast of the abdomen pelvis and chest were unremarkable.    Assessment/Plan:    The patient has a recurrent thromboembolic event. Her first is approximately 6 months ago. This happened without being on any antiplatelet medicines. She was started on aspirin and has had a second event. Earlier she underwent a full embolic workup which was negative with the exception of hypertriglyceridemia. She was admitted overnight for anticoagulation. She had a bubble study today which was negative and a CT scan of the chest abdomen and pelvis which was unremarkable  Appreciate hematology assistance with this patient. We'll continue with anticoagulation. She'll be discharged when she is therapeutic on her anticoagulation.  Mariena Meares IV, V. WELLS 09/06/2011 2:54 PM --  Filed Vitals:   09/06/11 1300  BP: 139/92  Pulse: 73  Temp: 97.9 F (36.6 C)  Resp: 18    Intake/Output Summary (Last 24 hours) at 09/06/11 1454 Last data filed at 09/06/11 1300  Gross per 24 hour  Intake 1741.22 ml  Output   2950 ml  Net -1208.78 ml     Laboratory CBC    Component Value Date/Time   WBC 8.6 09/06/2011 1358   HGB 13.6 09/06/2011 1358   HCT 38.7 09/06/2011 1358   PLT 197 09/06/2011 1358    BMET    Component Value Date/Time   NA 141 09/05/2011 1632   K 3.5 09/05/2011 1632   CL 107 09/05/2011 1632   CO2 22 09/05/2011 1632   GLUCOSE 170* 09/05/2011 1632   BUN 17 09/05/2011 1632   CREATININE 1.34* 09/05/2011 1632   CALCIUM 9.9 09/05/2011 1632   GFRNONAA 43* 09/05/2011 1632   GFRAA 50*  09/05/2011 1632    COAG No results found for this basename: INR, PROTIME   No results found for this basename: PTT    Antibiotics Anti-infectives    None       V. Charlena Cross, M.D. [facility] Office: [phone] Pager:  [phone]

## 2011-09-06 NOTE — Progress Notes (Signed)
ANTICOAGULATION CONSULT NOTE - Initial Consult  Pharmacy Consult for Heparin Indication: Ischemic toe  Allergies  Allergen Reactions  . Penicillins   . Statins   . Sulfa Antibiotics   . Latex Rash    Occasional SOB    Patient Measurements: Height: 5\' 4"  (162.6 cm) Weight: 207 lb 0.2 oz (93.9 kg) IBW/kg (Calculated) : 54.7  Adjusted Body Weight: =76kg  Vital Signs: Temp: 98 F (36.7 C) (01/14 2115) Temp src: Oral (01/14 2115) BP: 169/71 mmHg (01/14 2115) Pulse Rate: 83  (01/14 2115)  Labs:  Basename 09/05/11 2319 09/05/11 1632  HGB -- 14.1  HCT -- 41.2  PLT -- 209  APTT -- --  LABPROT -- --  INR -- --  HEPARINUNFRC 0.18* --  CREATININE -- 1.34*  CKTOTAL -- --  CKMB -- --  TROPONINI -- --   Estimated Creatinine Clearance: 50.9 ml/min (by C-G formula based on Cr of 1.34).  Assessment: 61 yo female with ischemic toe for anticoagulation  Goal of Therapy:  Heparin level 0.3-0.7 units/ml   Plan:  Heparin 2000 units IV bolus, then increase 1350 units/hr Check heparin level in 6 hours.    Troyce Febo, Gary Fleet 09/06/2011,12:52 AM

## 2011-09-06 NOTE — Progress Notes (Signed)
Subjective: 30 F with DM, HTN, Hyperlipidemia/Hypertriglyceridemia and other issues admitted by Vasc Surgery for ischemic toe ?embolic. This has happened before and work up was (-). She is on Tele and Hep gtt. She was on ASA for this current event.  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 Surgical History (reviewed - no changes required): Nasal & Sinus surgery S/P Pelivc Kidney removed 2002/Only has 1 Kidney T & A 1969 Ab Hernia from kidney surgery  Objective: Vital signs in last 24 hours: Temp:  [98 F (36.7 C)-98.7 F (37.1 C)] 98.1 F (36.7 C) (01/15 0427) Pulse Rate:  [61-83] 61  (01/15 0427) Resp:  [18-20] 18  (01/15 0427) BP: (149-169)/(71-99) 166/95 mmHg (01/15 0427) SpO2:  [96 %-98 %] 96 % (01/15 0427) Weight:  [93.895 kg (207 lb)-93.9 kg (207 lb 0.2 oz)] 93.9 kg (207 lb 0.2 oz) (01/14 1847) Weight change:  Last BM Date: 09/04/11  CBG (last 3)   Basename 09/06/11 0558 09/05/11 2112 09/05/11 1627  GLUCAP 190* 263* 163*    Intake/Output from previous day:  Intake/Output Summary (Last 24 hours) at 09/06/11 0659 Last data filed at 09/06/11 0600  Gross per 24 hour  Intake 1501.22 ml  Output   2050 ml  Net -548.78 ml   01/14 0701 - 01/15 0700 In: 1501.2 [P.O.:1300; I.V.:151.2; IV Piggyback:50] Out: 2050 [Urine:2050]   Physical Exam Gen - A+O HR Reg R 2nd toe - Purple/discolored.  Foot with good pulse.  Decent cap refill.  Lab Results:  [facility] 09/05/11 1632  NA 141  K 3.5  CL 107  CO2 22  GLUCOSE 170*  BUN 17  CREATININE 1.34*  CALCIUM 9.9  MG --  PHOS --    No results found for this basename: AST:2,ALT:2,ALKPHOS:2,BILITOT:2,PROT:2,ALBUMIN:2 in the  last 72 hours   Basename 09/05/11 1632  WBC 10.6*  NEUTROABS --  HGB 14.1  HCT 41.2  MCV 83.9  PLT 209    No results found for this basename: INR,  PROTIME    No results found for this basename: CKTOTAL:3,CKMB:3,CKMBINDEX:3,TROPONINI:3 in the last 72 hours  No results found for this basename: TSH,T4TOTAL,FREET3,T3FREE,THYROIDAB in the last 72 hours  No results found for this basename: VITAMINB12:2,FOLATE:2,FERRITIN:2,TIBC:2,IRON:2,RETICCTPCT:2 in the last 72 hours  Micro Results: No results found for this or any previous visit (from the past 240 hour(s)).   Studies/Results: Ct Abdomen Pelvis Wo Contrast  09/05/2011  *RADIOLOGY REPORT*  Clinical Data:  Hypercoagulable.  Evaluate aneurysm.  CT CHEST, ABDOMEN AND PELVIS WITHOUT CONTRAST  Technique:  Multidetector CT imaging of the chest, abdomen and pelvis was performed following the standard protocol without IV contrast.  Comparison:  MRI 12/02/2010.  CT CHEST  Findings:  The chest wall is unremarkable.  No breast masses, supraclavicular or axillary lymphadenopathy.  The bony thorax is intact.  Mild to moderate degenerative changes.  The heart is normal in size.  No pericardial effusion.  No mediastinal or hilar lymphadenopathy.  The aorta is normal in caliber.  No atherosclerotic calcifications.  The major branch vessels are normal.  No ostial calcifications.  No coronary artery calcifications.  The esophagus is grossly normal.  Examination of the lung parenchyma demonstrates no acute pulmonary findings.  No pulmonary nodules or masses.  No pleural effusion.  IMPRESSION: Unremarkable CT examination of the chest without contrast.  CT ABDOMEN AND PELVIS  Findings:  There is diffuse and fairly marked fatty infiltration of the liver.  Mild focal fatty sparing in the gallbladder fossa. Gallbladder is mildly contracted.  No biliary dilatation.  The pancreas is normal.  The spleen is normal in size.  No focal lesions.  The solitary right kidney  appears normal.  The adrenal glands are normal.  The stomach, duodenum, small bowel and colon are grossly normal without oral contrast.  No mesenteric or retroperitoneal masses or lymphadenopathy.  The aorta is normal in caliber.  No atherosclerotic calcifications.  The major branch vessels appear normal.  No ostial calcifications.  The uterus is surgically absent.  No pelvic mass, adenopathy or free pelvic fluid collection.  Moderate diverticulosis of the sigmoid colon.  The appendix is normal.  No inguinal mass or hernia.  The bony pelvis is intact.  There are surgical changes from a previous anterior abdominal wall hernia repair with mesh.  No recurrent hernia.  IMPRESSION: Unremarkable CT abdomen/pelvis without contrast.  Original Report Authenticated By: P. Loralie Champagne, Jordan.D.   Ct Chest Wo Contrast  09/05/2011  *RADIOLOGY REPORT*  Clinical Data:  Hypercoagulable.  Evaluate aneurysm.  CT CHEST, ABDOMEN AND PELVIS WITHOUT CONTRAST  Technique:  Multidetector CT imaging of the chest, abdomen and pelvis was performed following the standard protocol without IV contrast.  Comparison:  MRI 12/02/2010.  CT CHEST  Findings:  The chest wall is unremarkable.  No breast masses, supraclavicular or axillary lymphadenopathy.  The bony thorax is intact.  Mild to moderate degenerative changes.  The heart is normal in size.  No pericardial effusion.  No mediastinal or hilar lymphadenopathy.  The aorta is normal in caliber.  No atherosclerotic calcifications.  The major branch vessels are normal.  No ostial calcifications.  No coronary artery calcifications.  The esophagus is grossly normal.  Examination of the lung parenchyma demonstrates no acute pulmonary findings.  No pulmonary nodules or masses.  No pleural effusion.  IMPRESSION: Unremarkable CT examination of the chest without contrast.  CT ABDOMEN AND PELVIS  Findings:  There is diffuse and fairly marked fatty infiltration of the liver.  Mild focal fatty sparing in the  gallbladder fossa. Gallbladder is mildly contracted.  No biliary dilatation.  The pancreas is normal.  The spleen is normal in size.  No focal lesions.  The solitary right kidney appears normal.  The adrenal glands are normal.  The stomach, duodenum, small bowel and colon are grossly normal without oral contrast.  No mesenteric or retroperitoneal masses or lymphadenopathy.  The aorta is normal in caliber.  No atherosclerotic calcifications.  The major branch vessels appear normal.  No ostial calcifications.  The uterus is surgically absent.  No pelvic mass, adenopathy or free pelvic fluid collection.  Moderate diverticulosis of the sigmoid colon.  The appendix is normal.  No inguinal mass or hernia.  The bony pelvis is intact.  There are surgical changes from a previous anterior abdominal wall hernia repair with mesh.  No recurrent hernia.  IMPRESSION: Unremarkable CT abdomen/pelvis without contrast.  Original Report Authenticated By: P. Loralie Champagne, Jordan.D.     Medications: Scheduled:    . atenolol  100 mg Oral QHS  . cyclobenzaprine  10 mg Oral QHS  . exenatide  5 mcg Subcutaneous Q breakfast  . famotidine (PEPCID) IV  20 mg Intravenous Q12H  . glyBURIDE  2.5 mg Oral BID WC  . heparin  2,000 Units Intravenous Once  . heparin  4,000 Units Intravenous Once  . insulin aspart  0-15 Units Subcutaneous TID WC  . levothyroxine  150 mcg Oral Q0700  . olmesartan  40 mg Oral Daily  . potassium chloride  20 mEq Oral Once  . potassium chloride  20-40 mEq Oral Once  . topiramate  45 mg Oral QHS  . DISCONTD: exenatide  5 mcg Subcutaneous Daily  . DISCONTD: glyBURIDE  2.5 mg Oral BID WC  . DISCONTD: topiramate  45 mg Oral QPM   Continuous:    . heparin 1,350 Units/hr (09/06/11 0107)     Assessment/Plan: Active Problems:  Pain in toe of right foot  DM (diabetes mellitus), type 2 with peripheral vascular complications  HTN (hypertension)  Hypertriglyceridemia  Ischemic Toe - Repeat work up in  progress per Vasc surgery. On Hep.  2D echo with bubble ordered. ?need coumadin.  DM2 - Needs tighter contol.  A1C is 8.8% as OutPt.  Too High.  Start Lantus.  DM education while in Hospital.  Agree with Hold Glucophage if any studies to take place. OutPt Regimen includes: Her updated medication list for this problem includes:    Glyburide 2.5 Mg Tabs (Glyburide) .Marland Kitchen... 2  tab po qd    Metformin Hcl 1000 Mg Tabs (Metformin hcl) ..... One po bid    Byetta 5 Mcg Pen 5 Mcg/0.57ml Soln (Exenatide) .Marland KitchenMarland KitchenMarland KitchenMarland Kitchen 5 mcg daily with dinner for the first week then bid with meals after. Efraim Kaufmann showed her how to use it. sample given.    Benicar 40 Mg Tabs (Olmesartan medoxomil) .Marland Kitchen... 1/2 - 1 po daily - samples given    [facility] 09/06/11 0558 09/05/11 2112 09/05/11 1627  GLUCAP 190* 263* 163*   HTN - BP High.   Her outPt medication list for this problem includes:    Atenolol 100 Mg Tabs (Atenolol) .Marland Kitchen... 1 tab po qd    Benicar 40 Mg Tabs (Olmesartan medoxomil) .Marland Kitchen... 1/2 - 1 po daily - samples given  She claims the BP meds I have her on induce constipation.  Change Atenolol to coreg and add Norvasc.  Watch  Hyperlipidemia/Hypertriglyceridemia. Last LDl in 70's.  She has been intolerant to statins.  She stopped Fibrates due to cost.  iI will restart them again now. Await FLP  DVT Prophylaxis - Hep gtt  HYPOTHYROIDISM (ICD-244.9) Last Tests:   TSH                       0.74 uIU/ml                 0.40-4.20   FREE T4                   1.4 ng/mL                   0.8-1.8 on Synthroid 150 Mcg Tabs (Levothyroxine sodium) .Marland Kitchen... Take one tablet by mouth every morning  Gout  Nephrolithiasis - no Sxs but UA suggests CaOx.  Migraines - topamax  ADD/Hypersomnolence - She takes provigil when she can afford.  I do not believe this has been active recently.    Complete Medication List: 1)  Atenolol 100 Mg Tabs (Atenolol) .Marland Kitchen.. 1 tab po qd 2)  Cyclobenzaprine Hcl 10 Mg Tabs (Cyclobenzaprine hcl) .Marland Kitchen.. 1 tab po  qhs 3)  Glyburide 2.5 Mg Tabs (Glyburide) .... 2  tab po qd 4)  Synthroid 150 Mcg Tabs (Levothyroxine sodium) .... Take  one tablet by mouth every morning 5)  Metformin Hcl 1000 Mg Tabs (Metformin hcl) .... One po bid 6)  Freestyle Test Strp (Glucose blood) .... Test 1 x per day 7)  Colcrys 0.6 Mg Tabs (Colchicine) .Marland Kitchen.. 1 po bid prn 8)  Byetta 5 Mcg Pen 5 Mcg/0.14ml Soln (Exenatide) .... 5 mcg daily with dinner for the first week then bid with meals after. Efraim Kaufmann showed her how to use it. sample given. 9)  Bd Pen Needle Nano U/f 32g X 4 Mm Misc (Insulin pen needle) .... Use 2 needles per day 10)  Provigil 200 Mg Tabs (Modafinil) .... One po daily 11)  Imitrex 50 Mg Tabs (Sumatriptan succinate) .Marland Kitchen.. 1-2 po every 2 hours prn, max of 200mg  in 24 hour period 12)  Benicar 40 Mg Tabs (Olmesartan medoxomil) .... 1/2 - 1 po daily - samples given   LOS: 1 day   Debra Jordan 09/06/2011, 6:59 AM

## 2011-09-06 NOTE — Progress Notes (Signed)
ANTICOAGULATION CONSULT NOTE - Follow Up Consult  Pharmacy Consult for heparin Indication: ischemic toe  Vital Signs: Temp: 98.3 F (36.8 C) (01/15 0837) Temp src: Oral (01/15 0837) BP: 141/81 mmHg (01/15 0837) Pulse Rate: 73  (01/15 0837)  Labs:  Basename 09/06/11 0823 09/05/11 2319 09/05/11 1632  HGB 13.7 -- 14.1  HCT 39.9 -- 41.2  PLT 187 -- 209  APTT -- -- --  LABPROT -- -- --  INR -- -- --  HEPARINUNFRC 0.31 0.18* --  CREATININE -- -- 1.34*  CKTOTAL -- -- --  CKMB -- -- --  TROPONINI -- -- --   Estimated Creatinine Clearance: 50.9 ml/min (by C-G formula based on Cr of 1.34).   Medications:  Infusions:    . heparin 1,350 Units/hr (09/06/11 0107)    Assessment: 60 yof on IV heparin for ischemic toe. Heparin level is therapeutic but on the very low end. No bleeding or other problems noted.   Goal of Therapy:  Heparin level 0.3-0.7 units/ml   Plan:  1. Continue heparin at 1350units/hr 2. Check a 6 hour heparin level to confirm 3. F/u AM heparin level and further anticoagulation plans  Ltanya Bayley, Drake Leach 09/06/2011,9:57 AM

## 2011-09-06 NOTE — Progress Notes (Signed)
ANTICOAGULATION CONSULT NOTE - Follow Up Consult  Pharmacy Consult for Heparin Indication: recurrent ischemic toe  Vital Signs: Temp: 98 F (36.7 C) (01/15 1700) Temp src: Oral (01/15 2028) BP: 142/85 mmHg (01/15 2028) Pulse Rate: 73  (01/15 2028)  Labs:  Basename 09/06/11 2056 09/06/11 1505 09/06/11 1358 09/06/11 0823 09/05/11 1632  HGB -- -- 13.6 13.7 --  HCT -- -- 38.7 39.9 41.2  PLT -- -- 197 187 209  APTT -- -- 45* -- --  LABPROT -- 13.6 -- -- --  INR -- 1.02 -- -- --  HEPARINUNFRC 0.30 -- 0.19* 0.31 --  CREATININE -- -- -- -- 1.34*  CKTOTAL -- -- -- -- --  CKMB -- -- -- -- --  TROPONINI -- -- -- -- --   Estimated Creatinine Clearance: 50.9 ml/min (by C-G formula based on Cr of 1.34).  Medications:  Infusions:     . heparin 1,500 Units/hr (09/06/11 1507)   Assessment: 2nd ischemic/embolic event in toes. Heparin level 0.3 now in goal range.  Goal of Therapy:  Heparin level 0.3-0.7   Plan:  Continue heparin at 1500 units/hr and check next level in am.  Misty Stanley Stillinger 09/06/2011,9:37 PM

## 2011-09-06 NOTE — Consult Note (Signed)
Inpatient Hematology Consult   Referring MD: Juleen China  PCP:  Creola Corn  Reason for Referral: unexplained recurrent ischemic episodes bilateral pedal digits    HPI:  I am asked to evaluate this 61 year old woman for recurrent ischemic events involving toes on both of her feet. 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 She is still hypertensive on medication. She has mixed hyperlipidemia both high cholesterol and high triglycerides. She is a type II diabetic on oral agents up until now. She has migraine headaches. In late March of this year she woke from sleep and was going to the bathroom when she had sudden onset of pain in the left great toe. No trauma. Toe began to swell and turn purple. She was evaluated by Dr. Myra Gianotti. An extensive evaluation ensued including an echocardiogram which was normal. A repeat study in the hospital today with a bubble study was also normal. She had a MR angiogram of the chest abdomen and pelvis with bilateral runoff. All major vessels appeared patent. No aneurysms. No atherosclerotic plaquing. She was started on aspirin. Ischemic changes resolved completely. On the day of the current admission, she was sitting at her desk when her second right toe became suddenly painful and developed similar ischemic changes. She was told to come to the hospital for immediate evaluation. She did continue her aspirin as instructed at time of her initial event. She is now on a full dose intravenous heparin. Prior to these events she had no personal history of blood clots and no family history of blood clots. Of interest her mother developed Goodpasture's syndrome at age 50 requiring dialysis and ultimately succumbed to a ruptured aneurysm in a vessel in her nose. She has 3 brothers all younger than her; one had a thyroidectomy for unclear reasons. No one with blood clots or collagen vascular  disorder. She has a son who is healthy and a daughter who has a bipolar disorder. She does suffer from gout which affects primarily her hands and ankles. She is not on any chronic medication for this. She does note cold intolerance with pain and discoloration of her fingers in the winter. She has no large joint complaints. She is currently getting a migraine headache about once a month. She has noted recent easy bruisability and increased bleeding when she flosses her teeth. She has not noted any skin rashes. She has had some intermittent upper left chest soreness which she attributes to anxiety. Occasional skipped beat of her heart at the same time she gets the chest soreness. She has not noted any hematuria, hematochezia, melena, or epistaxis. Potential interest is the fact that her husband died of complications of hepatitis C.  She is a never smoker and does not use alcohol.     Past Medical History  Diagnosis Date  . Hypertension   . Hyperlipidemia   . Arthritis     gout  . ADD (attention deficit disorder)   . History of headache   . Trouble swallowing   . Seizure disorder   . History of venous thrombosis and embolism     Left great toe  . Hypothyroidism   . Chronic kidney disease     hx of left nephrectomy  . Diabetes mellitus     type 2  . Neuromuscular disorder     neuropathy to feet bilateral  . Fibromyalgia   . Ischemia of extremity 09/06/2011  . Hypothyroid  09/06/2011  . Gout 09/06/2011  . Migraine headache 09/06/2011  . History of nephrectomy, unilateral 09/06/2011  . DM w/o complication type II, uncontrolled 09/06/2011  :  Past Surgical History  Procedure Date  . Nephrectomy     left nephrectomy  . Hernia repair   . Tubal ligation   . Abdominal hysterectomy with subsequent bilateral oophorectomy for polycystic ovary disease    . Tonsillectomy   . Lymph node biopsy: age 55  benign   :     . amLODipine  2.5 mg Oral Daily  . carvedilol  12.5 mg Oral BID WC    . cyclobenzaprine  10 mg Oral QHS  . exenatide  5 mcg Subcutaneous Q breakfast  . famotidine (PEPCID) IV  20 mg Intravenous Q12H  . fenofibrate  54 mg Oral Daily  . glyBURIDE  2.5 mg Oral BID WC  . heparin  2,000 Units Intravenous Once  . heparin  4,000 Units Intravenous Once  . insulin aspart  0-15 Units Subcutaneous TID WC  . insulin glargine  10 Units Subcutaneous Q0700  . levothyroxine  150 mcg Oral Q0700  . olmesartan  40 mg Oral Daily  . potassium chloride  20 mEq Oral Once  . potassium chloride  20-40 mEq Oral Once  . topiramate  45 mg Oral QHS  . DISCONTD: atenolol  100 mg Oral QHS  . DISCONTD: exenatide  5 mcg Subcutaneous Daily  . DISCONTD: glyBURIDE  2.5 mg Oral BID WC  . DISCONTD: topiramate  45 mg Oral QPM  :  Allergies  Allergen Reactions  . Penicillins   . Statins   . Sulfa Antibiotics   . Latex Rash    Occasional SOB  :  Family History  Problem Relation Age of Onset  . Stroke Mother     ruptured intracranial aneurysm  . Other Mother     blood clot history, varicose veins  . Cancer: Lung heavy smoker and asbestos exposure  Father   . Stroke Maternal Grandmother   : 3 younger brothers one status post thyroidectomy.   History   Social History  . Marital Status: Widowed    Spouse Name: N/A    Number of Children:  2 biological children a son and a daughter daughter with bipolar disorder. 2 adopted daughters both healthy.   . Years of Education: N/A   Occupational History  .  she is an Airline pilot for [employer]    Social History Main Topics  . Smoking status: Never Smoker   . Smokeless tobacco: Never Used  . Alcohol Use: No  . Drug Use: No  . Sexually Active: No   Other Topics Concern  . Not on file   Social History Narrative  . No narrative on file  :  ROS: No anorexia weight loss night sweats Eyes: No dry eyes Throat: No dysphagia Neck: Resp:  No dyspnea or cough Cardio: Intermittent chest soreness with anxiety  GI: No change in  bowel habit no hematochezia or melena Extremities: See above Lymph nodes: She notes no swollen glands Neurologic: Migraine pattern which was stable for many years now more frequent once per month occasional aura with tongue numbness Skin: . See above Genitourinary: See above no hematuria no vaginal bleeding; chronic inguinal rash presumed candida by history   Vitals: Filed Vitals:   09/06/11 1146  BP: 136/78  Pulse: 72  Temp: 97.8 F (36.6 C)  Resp: 18    PHYSICAL EXAM: General appearance: Alert cooperative obese Caucasian  woman Head: Normal Eyes: Pupils equal round reactive Throat: No erythema or exudate Neck: Full range of motion Lymph Nodes: No cervical supraclavicular or axillary adenopathy Resp: Clear to auscultation resonant to percussion Cardio: Regular cardiac rhythm no murmur gallop or rub. No carotid renal femoral or abdominal bruits Breasts: Not examined GI: Obese soft nontender no mass no tenderness no organomegaly GU: Not examined Extremities: No edema no calf tenderness. Osteo-arthritic changes of the fingers with Heberden's nodes. Vascular: Ischemic changes second right toe but strong 2+ dorsalis pedis pulses symmetric; 1+  posterior. tibial pulse 1 plus on the left and not not palpable on the right; radial and ulnar pulses both 2+ and symmetric;  femoral pulses 2+ Neurologic: Mental status intact cranial nerves grossly normal motor strength 5 over 5 reflexes 2+ symmetric upper body coordination is normal gait not tested Skin: No rash no ecchymoses  Labs: Remarkable for the absence of anemia, near normal renal function for somebody with 1 kidney who is hypertensive and diabetic, and absence of blood or protein in the urine.   Basename 09/06/11 0823 09/05/11 1632  WBC 9.2 10.6*  HGB 13.7 14.1  HCT 39.9 41.2  PLT 187 209    Basename 09/05/11 1632  NA 141  K 3.5  CL 107  CO2 22  GLUCOSE 170*  BUN 17  CREATININE 1.34*  CALCIUM 9.9    Blood smear  review: Pending  Images Studies/Results:  Ct Abdomen Pelvis Wo Contrast  09/05/2011  *RADIOLOGY REPORT*  Clinical Data:  Hypercoagulable.  Evaluate aneurysm.  CT CHEST, ABDOMEN AND PELVIS WITHOUT CONTRAST  Technique:  Multidetector CT imaging of the chest, abdomen and pelvis was performed following the standard protocol without IV contrast.  Comparison:  MRI 12/02/2010.  CT CHEST  Findings:  The chest wall is unremarkable.  No breast masses, supraclavicular or axillary lymphadenopathy.  The bony thorax is intact.  Mild to moderate degenerative changes.  The heart is normal in size.  No pericardial effusion.  No mediastinal or hilar lymphadenopathy.  The aorta is normal in caliber.  No atherosclerotic calcifications.  The major branch vessels are normal.  No ostial calcifications.  No coronary artery calcifications.  The esophagus is grossly normal.  Examination of the lung parenchyma demonstrates no acute pulmonary findings.  No pulmonary nodules or masses.  No pleural effusion.  IMPRESSION: Unremarkable CT examination of the chest without contrast.  CT ABDOMEN AND PELVIS  Findings:  There is diffuse and fairly marked fatty infiltration of the liver.  Mild focal fatty sparing in the gallbladder fossa. Gallbladder is mildly contracted.  No biliary dilatation.  The pancreas is normal.  The spleen is normal in size.  No focal lesions.  The solitary right kidney appears normal.  The adrenal glands are normal.  The stomach, duodenum, small bowel and colon are grossly normal without oral contrast.  No mesenteric or retroperitoneal masses or lymphadenopathy.  The aorta is normal in caliber.  No atherosclerotic calcifications.  The major branch vessels appear normal.  No ostial calcifications.  The uterus is surgically absent.  No pelvic mass, adenopathy or free pelvic fluid collection.  Moderate diverticulosis of the sigmoid colon.  The appendix is normal.  No inguinal mass or hernia.  The bony pelvis is intact.   There are surgical changes from a previous anterior abdominal wall hernia repair with mesh.  No recurrent hernia.  IMPRESSION: Unremarkable CT abdomen/pelvis without contrast.  Original Report Authenticated By: P. Loralie Champagne, M.D.   Ct Chest Wo  Contrast  09/05/2011  *RADIOLOGY REPORT*  Clinical Data:  Hypercoagulable.  Evaluate aneurysm.  CT CHEST, ABDOMEN AND PELVIS WITHOUT CONTRAST  Technique:  Multidetector CT imaging of the chest, abdomen and pelvis was performed following the standard protocol without IV contrast.  Comparison:  MRI 12/02/2010.  CT CHEST  Findings:  The chest wall is unremarkable.  No breast masses, supraclavicular or axillary lymphadenopathy.  The bony thorax is intact.  Mild to moderate degenerative changes.  The heart is normal in size.  No pericardial effusion.  No mediastinal or hilar lymphadenopathy.  The aorta is normal in caliber.  No atherosclerotic calcifications.  The major branch vessels are normal.  No ostial calcifications.  No coronary artery calcifications.  The esophagus is grossly normal.  Examination of the lung parenchyma demonstrates no acute pulmonary findings.  No pulmonary nodules or masses.  No pleural effusion.  IMPRESSION: Unremarkable CT examination of the chest without contrast.  CT ABDOMEN AND PELVIS  Findings:  There is diffuse and fairly marked fatty infiltration of the liver.  Mild focal fatty sparing in the gallbladder fossa. Gallbladder is mildly contracted.  No biliary dilatation.  The pancreas is normal.  The spleen is normal in size.  No focal lesions.  The solitary right kidney appears normal.  The adrenal glands are normal.  The stomach, duodenum, small bowel and colon are grossly normal without oral contrast.  No mesenteric or retroperitoneal masses or lymphadenopathy.  The aorta is normal in caliber.  No atherosclerotic calcifications.  The major branch vessels appear normal.  No ostial calcifications.  The uterus is surgically absent.  No pelvic  mass, adenopathy or free pelvic fluid collection.  Moderate diverticulosis of the sigmoid colon.  The appendix is normal.  No inguinal mass or hernia.  The bony pelvis is intact.  There are surgical changes from a previous anterior abdominal wall hernia repair with mesh.  No recurrent hernia.  IMPRESSION: Unremarkable CT abdomen/pelvis without contrast.  Original Report Authenticated By: P. Loralie Champagne, M.D.     Pathology: None available    Assessment and Plan:  Recurrent small vessel ischemic changes affecting only the toes of the left and right foot in a hypertensive, diabetic, hyperlipidemic female. No evidence of the "blue toe syndrome" based on a patent large vessels in the abdomen and pelvis without any atherosclerotic changes on recent MRA. No evidence for cardiac etiology. In the absence of systemic symptoms or evidence for other organ failure, her signs and symptoms would be unlikely to represent a systemic vasculitis although this remains in the differential. The congenital coagulopathies almost exclusively affect veins and not arteries. Of the acquired coagulopathies, arterial involvement is seen in association with the antiphospholipid antibody syndrome, native vessel disease related to diabetes and hyperlipidemia, and at a younger age, hyper- homocystinemia. Of interest is the history of hepatitis C in her husband and Goodpasture's syndrome in her mother. In view of this history I am going to check her for hepatitis C and the presence of cryoglobulins which are associated with hepatitis C infection and could explain her symptoms. I will screen her for the common hereditary and acquired coagulopathies but I believe this will be low yield. Recommendation: Given the recurrent nature of her events and the fact that they occurred while on aspirin, I would agree with extending her anticoagulation. It does not appear that she has a surgically correctable problem so it would be reasonable to  go ahead and start her on Coumadin. I am checking a  number of laboratory studies which may shed some light on the situation.  Thank you for this consultation   Keelynn Furgerson M 09/06/2011, 1:34 PM

## 2011-09-07 LAB — PROTEIN S ACTIVITY: Protein S Activity: 68 % — ABNORMAL LOW (ref 69–129)

## 2011-09-07 LAB — CBC
HCT: 40.7 % (ref 36.0–46.0)
Hemoglobin: 13.8 g/dL (ref 12.0–15.0)
MCH: 28.8 pg (ref 26.0–34.0)
MCHC: 33.9 g/dL (ref 30.0–36.0)
MCV: 84.8 fL (ref 78.0–100.0)
Platelets: 201 10*3/uL (ref 150–400)
RBC: 4.8 MIL/uL (ref 3.87–5.11)
RDW: 14.4 % (ref 11.5–15.5)
WBC: 10.1 10*3/uL (ref 4.0–10.5)

## 2011-09-07 LAB — URINE MICROSCOPIC-ADD ON

## 2011-09-07 LAB — URINALYSIS, ROUTINE W REFLEX MICROSCOPIC
Bilirubin Urine: NEGATIVE
Glucose, UA: 250 mg/dL — AB
Hgb urine dipstick: NEGATIVE
Ketones, ur: NEGATIVE mg/dL
Nitrite: NEGATIVE
Protein, ur: NEGATIVE mg/dL
Specific Gravity, Urine: 1.014 (ref 1.005–1.030)
Urobilinogen, UA: 0.2 mg/dL (ref 0.0–1.0)
pH: 5 (ref 5.0–8.0)

## 2011-09-07 LAB — HEPARIN LEVEL (UNFRACTIONATED): Heparin Unfractionated: 0.28 IU/mL — ABNORMAL LOW (ref 0.30–0.70)

## 2011-09-07 LAB — IGG, IGA, IGM
IgA: 202 mg/dL (ref 69–380)
IgG (Immunoglobin G), Serum: 944 mg/dL (ref 690–1700)
IgM, Serum: 48 mg/dL — ABNORMAL LOW (ref 52–322)

## 2011-09-07 LAB — PROTIME-INR
INR: 1.03 (ref 0.00–1.49)
Prothrombin Time: 13.7 seconds (ref 11.6–15.2)

## 2011-09-07 LAB — GLUCOSE, CAPILLARY
Glucose-Capillary: 209 mg/dL — ABNORMAL HIGH (ref 70–99)
Glucose-Capillary: 239 mg/dL — ABNORMAL HIGH (ref 70–99)
Glucose-Capillary: 147 mg/dL — ABNORMAL HIGH (ref 70–99)
Glucose-Capillary: 154 mg/dL — ABNORMAL HIGH (ref 70–99)

## 2011-09-07 LAB — LIPID PANEL
Cholesterol: 230 mg/dL — ABNORMAL HIGH (ref 0–200)
HDL: 39 mg/dL — ABNORMAL LOW (ref >39)
LDL Cholesterol: UNDETERMINED mg/dL (ref 0–99)
Total CHOL/HDL Ratio: 5.9 RATIO
Triglycerides: 401 mg/dL — ABNORMAL HIGH (ref <150)
VLDL: UNDETERMINED mg/dL (ref 0–40)

## 2011-09-07 LAB — HEPATITIS PANEL, ACUTE
HCV Ab: NEGATIVE
Hep A IgM: NEGATIVE
Hep B C IgM: NEGATIVE
Hepatitis B Surface Ag: NEGATIVE

## 2011-09-07 LAB — PROTEIN C ACTIVITY: Protein C Activity: 153 % — ABNORMAL HIGH (ref 75–133)

## 2011-09-07 LAB — ANA: Anti Nuclear Antibody(ANA): NEGATIVE

## 2011-09-07 LAB — MPO/PR-3 (ANCA) ANTIBODIES

## 2011-09-07 LAB — RHEUMATOID FACTOR: Rhuematoid fact SerPl-aCnc: <10 IU/mL (ref <=14)

## 2011-09-07 MED ORDER — WARFARIN SODIUM 7.5 MG PO TABS
7.5000 mg | ORAL_TABLET | Freq: Once | ORAL | Status: AC
  Administered 2011-09-07: 7.5 mg via ORAL
  Filled 2011-09-07: qty 1

## 2011-09-07 MED ORDER — INSULIN GLARGINE 100 UNIT/ML ~~LOC~~ SOLN
15.0000 [IU] | SUBCUTANEOUS | Status: DC
  Administered 2011-09-08: 15 [IU] via SUBCUTANEOUS
  Filled 2011-09-07: qty 3

## 2011-09-07 MED ORDER — AMLODIPINE BESYLATE 5 MG PO TABS
5.0000 mg | ORAL_TABLET | Freq: Every day | ORAL | Status: DC
  Administered 2011-09-07 – 2011-09-08 (×2): 5 mg via ORAL
  Filled 2011-09-07 (×2): qty 1

## 2011-09-07 MED ORDER — FAMOTIDINE 20 MG PO TABS
20.0000 mg | ORAL_TABLET | Freq: Two times a day (BID) | ORAL | Status: DC
  Administered 2011-09-07 – 2011-09-08 (×2): 20 mg via ORAL
  Filled 2011-09-07 (×3): qty 1

## 2011-09-07 MED ORDER — CARVEDILOL 25 MG PO TABS
25.0000 mg | ORAL_TABLET | Freq: Two times a day (BID) | ORAL | Status: DC
  Administered 2011-09-07 – 2011-09-08 (×2): 25 mg via ORAL
  Filled 2011-09-07 (×4): qty 1

## 2011-09-07 MED ORDER — TOPIRAMATE 25 MG PO TABS
50.0000 mg | ORAL_TABLET | Freq: Once | ORAL | Status: DC
  Filled 2011-09-07: qty 2

## 2011-09-07 MED ORDER — CIPROFLOXACIN HCL 500 MG PO TABS
500.0000 mg | ORAL_TABLET | Freq: Two times a day (BID) | ORAL | Status: DC
  Administered 2011-09-07 – 2011-09-08 (×2): 500 mg via ORAL
  Filled 2011-09-07 (×5): qty 1

## 2011-09-07 NOTE — Progress Notes (Signed)
ANTICOAGULATION CONSULT NOTE - Follow Up Consult  Pharmacy Consult for heparin + coumadin  Indication: recurrent ischemic toe  Vital Signs: Temp: 98 F (36.7 C) (01/16 0410) Temp src: Oral (01/16 0410) BP: 160/83 mmHg (01/16 0615) Pulse Rate: 76  (01/16 0615)  Labs:  Basename 09/07/11 0519 09/06/11 2056 09/06/11 1505 09/06/11 1358 09/06/11 0823 09/05/11 1632  HGB 13.8 -- -- 13.6 -- --  HCT 40.7 -- -- 38.7 39.9 --  PLT 201 -- -- 197 187 --  APTT -- -- -- 45* -- --  LABPROT 13.7 -- 13.6 -- -- --  INR 1.03 -- 1.02 -- -- --  HEPARINUNFRC 0.28* 0.30 -- 0.19* -- --  CREATININE -- -- -- -- -- 1.34*  CKTOTAL -- -- -- -- -- --  CKMB -- -- -- -- -- --  TROPONINI -- -- -- -- -- --   Estimated Creatinine Clearance: 50.9 ml/min (by C-G formula based on Cr of 1.34).   Medications:  Infusions:    . heparin 1,500 Units/hr (09/07/11 4782)    Assessment: 60 yof on heparin and coumadin for recurrent ischemic toe. Heparin level this AM is slightly subtherapeutic. INR remains subtherapeutic at 1.03. However, pt refused her coumadin dose last night. Anticoagulation plans are still being discussed. MD noted some "bruising" in her toe but no changes to plan noted. CBC is stable and no other overt signs of bleeding reported. No update to anticoagulation plans so will continue with heparin and order a dose of coumadin for tonight.   Goal of Therapy:  INR 2-3 Heparin level 0.3-0.7 units/ml   Plan:  1. Increase heparin to 1650units/hr 2. Coumadin 7.5mg  PO x 1 tonight 3. F/u AM INR and heparin level 4. F/u anticoagulation plan  Mahdi Frye, Drake Leach 09/07/2011,8:35 AM

## 2011-09-07 NOTE — Plan of Care (Signed)
Problem: Food- and Nutrition-Related Knowledge Deficit (NB-1.1) Goal: Nutrition education Formal process to instruct or train a patient/client in a skill or to impart knowledge to help patients/clients voluntarily manage or modify food choices and eating behavior to maintain or improve health.  Outcome: Completed/Met Date Met:  09/07/11 RD was asked to speak with patient regarding diet education. Patient has had DM for several years and was able to control it mostly with diet but recently it has been out of control. Patient was seeing  health coaches who were advising her on her diet for DM and for lowering cholesterol. Patient was very confused by the information. RD explained carbohydrates, counting, and the general DM diet with patient. Patient was upset that she had been doing something wrong causing an increase in BS, RD explained to patient that her diet is not the only factor causing increased BS. Patient has a strong family hx of DM, both her mother and daughter have DM. RD also answered questions about vitamin K containing foods as patient is new on anticoagulant medications. Patient also had questions about triglycerides, which we answered by RD. Patient was given carbohydrate counting hand out from the [employer] as well as several resources with carbohydrate serving sizes. Patient had no more additional questions. RD expects good compliance with diet. RD reviewed chart, no additional nutrition interventions at this time.    Debra Jordan

## 2011-09-07 NOTE — Progress Notes (Addendum)
Subjective: 57 F with DM, HTN, Hyperlipidemia/Hypertriglyceridemia and other issues admitted by Vasc Surgery for ischemic toe ?embolic. This has happened before and work up was (-). She is on Tele and Hep gtt.  She is deciding b/w Coumadin/Xaralto/Pradaxa. She was on ASA for this current event. No Pain. Some "bruising" into 3rd toe.  Objective: Vital signs in last 24 hours: Temp:  [97.8 F (36.6 C)-98.3 F (36.8 C)] 98 F (36.7 C) (01/16 0410) Pulse Rate:  [64-76] 76  (01/16 0615) Resp:  [18-19] 19  (01/16 0410) BP: (136-160)/(78-92) 160/83 mmHg (01/16 0615) SpO2:  [95 %-98 %] 97 % (01/16 0410) Weight change:  Last BM Date: 09/06/11  CBG (last 3)   Basename 09/07/11 0628 09/06/11 2136 09/06/11 1658  GLUCAP 209* 190* 234*    Intake/Output from previous day:  Intake/Output Summary (Last 24 hours) at 09/07/11 0657 Last data filed at 09/07/11 0600  Gross per 24 hour  Intake    950 ml  Output   1900 ml  Net   -950 ml   01/15 0701 - 01/16 0700 In: 950 [P.O.:720; I.V.:180; IV Piggyback:50] Out: 1900 [Urine:1900]   Physical Exam Gen - A+O HR Reg Pulm CTA R 2nd toe - Purple/discolored.  Foot with good pulse.  Decent cap refill. Not cold.  Good ROM.  Lab Results:  [facility] 09/05/11 1632  NA 141  K 3.5  CL 107  CO2 22  GLUCOSE 170*  BUN 17  CREATININE 1.34*  CALCIUM 9.9  MG --  PHOS --    No results found for this basename: AST:2,ALT:2,ALKPHOS:2,BILITOT:2,PROT:2,ALBUMIN:2 in the last 72 hours   Basename 09/07/11 0519 09/06/11 1358  WBC 10.1 8.6  NEUTROABS -- 5.3  HGB 13.8 13.6  HCT 40.7 38.7  MCV 84.8 83.9  PLT 201 197    Lab Results  Component Value Date   INR 1.03 09/07/2011   INR 1.02 09/06/2011    No results found for this basename: CKTOTAL:3,CKMB:3,CKMBINDEX:3,TROPONINI:3 in the last 72 hours  No results found for this basename: TSH,T4TOTAL,FREET3,T3FREE,THYROIDAB in the last 72 hours   Basename 09/06/11 1358  VITAMINB12 --  FOLATE --    FERRITIN --  TIBC --  IRON --  RETICCTPCT 2.3    Micro Results: No results found for this or any previous visit (from the past 240 hour(s)).   Studies/Results: Ct Abdomen Pelvis Wo Contrast  09/05/2011  *RADIOLOGY REPORT*  Clinical Data:  Hypercoagulable.  Evaluate aneurysm.  CT CHEST, ABDOMEN AND PELVIS WITHOUT CONTRAST  Technique:  Multidetector CT imaging of the chest, abdomen and pelvis was performed following the standard protocol without IV contrast.  Comparison:  MRI 12/02/2010.  CT CHEST  Findings:  The chest wall is unremarkable.  No breast masses, supraclavicular or axillary lymphadenopathy.  The bony thorax is intact.  Mild to moderate degenerative changes.  The heart is normal in size.  No pericardial effusion.  No mediastinal or hilar lymphadenopathy.  The aorta is normal in caliber.  No atherosclerotic calcifications.  The major branch vessels are normal.  No ostial calcifications.  No coronary artery calcifications.  The esophagus is grossly normal.  Examination of the lung parenchyma demonstrates no acute pulmonary findings.  No pulmonary nodules or masses.  No pleural effusion.  IMPRESSION: Unremarkable CT examination of the chest without contrast.  CT ABDOMEN AND PELVIS  Findings:  There is diffuse and fairly marked fatty infiltration of the liver.  Mild focal fatty sparing in the gallbladder fossa. Gallbladder is mildly contracted.  No  biliary dilatation.  The pancreas is normal.  The spleen is normal in size.  No focal lesions.  The solitary right kidney appears normal.  The adrenal glands are normal.  The stomach, duodenum, small bowel and colon are grossly normal without oral contrast.  No mesenteric or retroperitoneal masses or lymphadenopathy.  The aorta is normal in caliber.  No atherosclerotic calcifications.  The major branch vessels appear normal.  No ostial calcifications.  The uterus is surgically absent.  No pelvic mass, adenopathy or free pelvic fluid collection.   Moderate diverticulosis of the sigmoid colon.  The appendix is normal.  No inguinal mass or hernia.  The bony pelvis is intact.  There are surgical changes from a previous anterior abdominal wall hernia repair with mesh.  No recurrent hernia.  IMPRESSION: Unremarkable CT abdomen/pelvis without contrast.  Original Report Authenticated By: P. Loralie Champagne, Jordan.D.   Ct Chest Wo Contrast  09/05/2011  *RADIOLOGY REPORT*  Clinical Data:  Hypercoagulable.  Evaluate aneurysm.  CT CHEST, ABDOMEN AND PELVIS WITHOUT CONTRAST  Technique:  Multidetector CT imaging of the chest, abdomen and pelvis was performed following the standard protocol without IV contrast.  Comparison:  MRI 12/02/2010.  CT CHEST  Findings:  The chest wall is unremarkable.  No breast masses, supraclavicular or axillary lymphadenopathy.  The bony thorax is intact.  Mild to moderate degenerative changes.  The heart is normal in size.  No pericardial effusion.  No mediastinal or hilar lymphadenopathy.  The aorta is normal in caliber.  No atherosclerotic calcifications.  The major branch vessels are normal.  No ostial calcifications.  No coronary artery calcifications.  The esophagus is grossly normal.  Examination of the lung parenchyma demonstrates no acute pulmonary findings.  No pulmonary nodules or masses.  No pleural effusion.  IMPRESSION: Unremarkable CT examination of the chest without contrast.  CT ABDOMEN AND PELVIS  Findings:  There is diffuse and fairly marked fatty infiltration of the liver.  Mild focal fatty sparing in the gallbladder fossa. Gallbladder is mildly contracted.  No biliary dilatation.  The pancreas is normal.  The spleen is normal in size.  No focal lesions.  The solitary right kidney appears normal.  The adrenal glands are normal.  The stomach, duodenum, small bowel and colon are grossly normal without oral contrast.  No mesenteric or retroperitoneal masses or lymphadenopathy.  The aorta is normal in caliber.  No atherosclerotic  calcifications.  The major branch vessels appear normal.  No ostial calcifications.  The uterus is surgically absent.  No pelvic mass, adenopathy or free pelvic fluid collection.  Moderate diverticulosis of the sigmoid colon.  The appendix is normal.  No inguinal mass or hernia.  The bony pelvis is intact.  There are surgical changes from a previous anterior abdominal wall hernia repair with mesh.  No recurrent hernia.  IMPRESSION: Unremarkable CT abdomen/pelvis without contrast.  Original Report Authenticated By: P. Loralie Champagne, Jordan.D.     Medications: Scheduled:    . amLODipine  2.5 mg Oral Daily  . carvedilol  12.5 mg Oral BID WC  . coumadin book   Does not apply Once  . cyclobenzaprine  10 mg Oral QHS  . exenatide  5 mcg Subcutaneous Q breakfast  . famotidine (PEPCID) IV  20 mg Intravenous Q12H  . fenofibrate  54 mg Oral Daily  . Flexpen Starter Kit  1 kit Other Once  . glyBURIDE  2.5 mg Oral BID WC  . insulin aspart  0-15 Units Subcutaneous TID WC  .  insulin glargine  10 Units Subcutaneous Q0700  . levothyroxine  150 mcg Oral Q0700  . living well with diabetes book   Does not apply Once  . metFORMIN  1,000 mg Oral BID WC  . olmesartan  40 mg Oral Daily  . potassium chloride  20-40 mEq Oral Once  . topiramate  45 mg Oral QHS  . topiramate  50 mg Oral Once  . warfarin  7.5 mg Oral ONCE-1800  . warfarin   Does not apply Once  . DISCONTD: atenolol  100 mg Oral QHS   Continuous:    . heparin 1,500 Units/hr (09/07/11 1610)     Assessment/Plan: Principal Problem:  *Ischemia of extremity Active Problems:  Pain in toe of right foot  DM (diabetes mellitus), type 2 with peripheral vascular complications  HTN (hypertension)  Hypertriglyceridemia  Hypothyroid  Gout  Migraine headache  History of nephrectomy, unilateral  DM w/o complication type II, uncontrolled  Ischemic Toe - recurrent Thromboembolic event.  Repeat work up in progress per Vasc surgery. On Hep gtt.  2D echo  with bubble ordered. She is deciding b/w coumadin/Pradaxa/Xaraltoo - she will call her Pharmacy and get copay info so she will know if she can afford the newer agents.  She realizes that coumadin requires at least once per month INR checks - We have a coumadin clinic in our office if she goes/stays on this.. Ab CT (-). 2d ECHO and Bubble (-):   The cavity size was normal. There was mild focal basal hypertrophy of the septum. Systolic function was normal. The estimated ejection fraction was in the range of 55% to 60%. Wall motion was normal; there were no regional wall motion abnormalities. There was an increased relative contribution of atrial contraction to ventricular filling. Doppler parameters are consistent with abnormal left ventricular relaxation (grade 1 diastolic dysfunction).  Sed Rate 16.  RF (-). IgG=944, IgA 202 A1C 8.4-8.5 Await Lipids as the thought last time was that this was a TG emboli and may be the same issue today. She is being loaded on Coumadin (or other newer agents) and I agree.  Saw Dr Cyndie Chime yesterday and Vasculitis is unlikely. He ordered lots of labs and most Pending   DM2 - Needs tighter contol.  A1C is 8.8% as OutPt - 8.5-8.6 here.  Too High.   We started Lantus yesterday.  Glucophage on hold till tomorrow as she had contrasted CT.  DM education while in Hospital.  Agree with Hold Glucophage if any further studies to take place. OutPt Regimen includes: Her updated medication list for this problem includes:    Glyburide 2.5 Mg Tabs (Glyburide) .Marland Kitchen... 2  tab po qd    Metformin Hcl 1000 Mg Tabs (Metformin hcl) ..... One po bid    Byetta 5 Mcg Pen 5 Mcg/0.58ml Soln (Exenatide) .Marland KitchenMarland KitchenMarland KitchenMarland Kitchen 5 mcg daily with dinner for the first week then bid with meals after. Efraim Kaufmann showed her how to use it. sample given.    Benicar 40 Mg Tabs (Olmesartan medoxomil) .Marland Kitchen... 1/2 - 1 po daily - samples given    Basename 09/07/11 0628 09/06/11 2136 09/06/11 1658  GLUCAP 209* 190* 234*    HTN - BP still too High.   Her outPt medication list for this problem includes:    Atenolol 100 Mg Tabs (Atenolol) .Marland Kitchen... 1 tab po qd    Benicar 40 Mg Tabs (Olmesartan medoxomil) .Marland Kitchen..Marland Kitchen1 daily  She claims the BP meds I have her on as outpt induce constipation.  Yesterday  we Changed Atenolol to coreg and added Norvasc.  Watch and continue to titrate. Pulse 76 and can handle more BB.  Hyperlipidemia/Hypertriglyceridemia. Last LDl in 70's.  She has been intolerant to statins.  She stopped Fibrates due to cost.  i will restart them again now. Await FLP - reordered as I do not see results.  DVT Prophylaxis - Hep gtt  HYPOTHYROIDISM (ICD-244.9) Last Tests:   TSH                       0.74 uIU/ml                 0.40-4.20   FREE T4                   1.4 ng/mL                   0.8-1.8 on Synthroid 150 Mcg Tabs (Levothyroxine sodium) .Marland Kitchen... Take one tablet by mouth every morning  Gout  Nephrolithiasis - no Sxs but UA suggests CaOx.  Migraines - topamax  ADD/Hypersomnolence - She takes provigil when she can afford.  I do not believe this has been active recently and will hold.    Complete Medication List: 1)  Atenolol 100 Mg Tabs (Atenolol) .Marland Kitchen.. 1 tab po qd 2)  Cyclobenzaprine Hcl 10 Mg Tabs (Cyclobenzaprine hcl) .Marland Kitchen.. 1 tab po qhs 3)  Glyburide 2.5 Mg Tabs (Glyburide) .... 2  tab po qd 4)  Synthroid 150 Mcg Tabs (Levothyroxine sodium) .... Take one tablet by mouth every morning 5)  Metformin Hcl 1000 Mg Tabs (Metformin hcl) .... One po bid 6)  Freestyle Test Strp (Glucose blood) .... Test 1 x per day 7)  Colcrys 0.6 Mg Tabs (Colchicine) .Marland Kitchen.. 1 po bid prn 8)  Byetta 5 Mcg Pen 5 Mcg/0.76ml Soln (Exenatide) .... 5 mcg daily with dinner for the first week then bid with meals after. Efraim Kaufmann showed her how to use it. sample given. 9)  Bd Pen Needle Nano U/f 32g X 4 Mm Misc (Insulin pen needle) .... Use 2 needles per day 10)  Provigil 200 Mg Tabs (Modafinil) .... One po daily 11)  Imitrex 50  Mg Tabs (Sumatriptan succinate) .Marland Kitchen.. 1-2 po every 2 hours prn, max of 200mg  in 24 hour period 12)  Benicar 40 Mg Tabs (Olmesartan medoxomil) .... 1/2 - 1 po daily - samples given   LOS: 2 days   Debra Jordan 09/07/2011, 6:57 AM   She reports possible UTI - Check UA.

## 2011-09-07 NOTE — Progress Notes (Addendum)
[facility] [place]  Postoperative Visit - Amputation  Date of Surgery: * No surgery found *  Right recurrent thromboembolic event with right second toe bruising.  Surgeon:  POD: * No surgery found *  Subjective Debra Jordan is a 61 y.o. female who is S/P Right thromboembolic event.  Pt.denies increased pain in the in right leg or foot, she does report numbness. The patient notes pain is well controlled.   Significant Diagnostic Studies: CBC    Component Value Date/Time   WBC 10.1 09/07/2011 0519   RBC 4.80 09/07/2011 0519   HGB 13.8 09/07/2011 0519   HCT 40.7 09/07/2011 0519   PLT 201 09/07/2011 0519   MCV 84.8 09/07/2011 0519   MCH 28.8 09/07/2011 0519   MCHC 33.9 09/07/2011 0519   RDW 14.4 09/07/2011 0519   LYMPHSABS 2.3 09/06/2011 1358   MONOABS 0.7 09/06/2011 1358   EOSABS 0.2 09/06/2011 1358   BASOSABS 0.1 09/06/2011 1358    BMET    Component Value Date/Time   NA 141 09/05/2011 1632   K 3.5 09/05/2011 1632   CL 107 09/05/2011 1632   CO2 22 09/05/2011 1632   GLUCOSE 170* 09/05/2011 1632   BUN 17 09/05/2011 1632   CREATININE 1.34* 09/05/2011 1632   CALCIUM 9.9 09/05/2011 1632   GFRNONAA 43* 09/05/2011 1632   GFRAA 50* 09/05/2011 1632    COAG Lab Results  Component Value Date   INR 1.03 09/07/2011   INR 1.02 09/06/2011   No results found for this basename: PTT     Intake/Output Summary (Last 24 hours) at 09/07/11 0808 Last data filed at 09/07/11 0600  Gross per 24 hour  Intake    950 ml  Output   1900 ml  Net   -950 ml   No data found.    Physical Examination  BP Readings from Last 3 Encounters:  09/07/11 160/83  09/05/11 163/99   Temp Readings from Last 3 Encounters:  09/07/11 98 F (36.7 C) Oral  09/05/11 98.6 F (37 C) Oral   SpO2 Readings from Last 3 Encounters:  09/07/11 97%  09/05/11 98%   Pulse Readings from Last 3 Encounters:  09/07/11 76  09/05/11 83    Pt is A&Ox3  WDWN female with no complaints  Right second toe  bruising and decreased sensation.  Palp DP pulse biphsic.     Assessment/plan:  Debra Jordan is a 61 y.o. female who is s/p Right embolalic event  Pending anticoagulation medication.  Plan D/C once anticoagulation is therapeutic.  COLLINS,EMMA MAUREEN 8:08 AM 09/07/2011 [phone]    Agre with above.  No changes in right 2nd toe.  Will discuss with heme-onc best option for anticoagulation.  Pt is a little apprehensive about taking coumadin  May be discharged once anti-coagulation issues resolved with follow up in 2 weeks to check the right 2nd toe  Wells Byrne Capek

## 2011-09-07 NOTE — Progress Notes (Signed)
Utilization review completed. Trampas Stettner, RN, BSN. 09/07/11 

## 2011-09-08 LAB — PROTIME-INR
INR: 1.02 (ref 0.00–1.49)
Prothrombin Time: 13.6 seconds (ref 11.6–15.2)

## 2011-09-08 LAB — CARDIOLIPIN ANTIBODIES, IGG, IGM, IGA
Anticardiolipin IgA: 4 APL U/mL — ABNORMAL LOW (ref <22)
Anticardiolipin IgG: 2 GPL U/mL — ABNORMAL LOW (ref <23)
Anticardiolipin IgM: 1 MPL U/mL — ABNORMAL LOW (ref <11)

## 2011-09-08 LAB — BETA-2-GLYCOPROTEIN I ABS, IGG/M/A
Beta-2 Glyco I IgG: 0 G Units (ref <20)
Beta-2-Glycoprotein I IgA: 1 A Units (ref <20)
Beta-2-Glycoprotein I IgM: 1 M Units (ref <20)

## 2011-09-08 LAB — CBC
HCT: 37.1 % (ref 36.0–46.0)
Hemoglobin: 12.3 g/dL (ref 12.0–15.0)
MCH: 28.3 pg (ref 26.0–34.0)
MCHC: 33.2 g/dL (ref 30.0–36.0)
MCV: 85.5 fL (ref 78.0–100.0)
Platelets: 160 10*3/uL (ref 150–400)
RBC: 4.34 MIL/uL (ref 3.87–5.11)
RDW: 14.3 % (ref 11.5–15.5)
WBC: 7.6 10*3/uL (ref 4.0–10.5)

## 2011-09-08 LAB — GLUCOSE, CAPILLARY
Glucose-Capillary: 132 mg/dL — ABNORMAL HIGH (ref 70–99)
Glucose-Capillary: 212 mg/dL — ABNORMAL HIGH (ref 70–99)

## 2011-09-08 LAB — IMMUNOFIXATION ELECTROPHORESIS
IgA: 193 mg/dL (ref 69–380)
IgG (Immunoglobin G), Serum: 915 mg/dL (ref 690–1700)
IgM, Serum: 42 mg/dL — ABNORMAL LOW (ref 52–322)
Total Protein ELP: 6.6 g/dL (ref 6.0–8.3)

## 2011-09-08 LAB — PROTHROMBIN GENE MUTATION

## 2011-09-08 LAB — HEPARIN LEVEL (UNFRACTIONATED): Heparin Unfractionated: 0.5 IU/mL (ref 0.30–0.70)

## 2011-09-08 MED ORDER — TRAMADOL HCL 50 MG PO TABS: 50.0000 mg | ORAL_TABLET | Freq: Two times a day (BID) | ORAL | Status: AC | PRN

## 2011-09-08 MED ORDER — TRAMADOL HCL 50 MG PO TABS: 50.0000 mg | ORAL_TABLET | Freq: Three times a day (TID) | ORAL | Status: DC | PRN

## 2011-09-08 MED ORDER — OLMESARTAN MEDOXOMIL 40 MG PO TABS: 40.0000 mg | ORAL_TABLET | Freq: Every day | ORAL | Status: DC

## 2011-09-08 MED ORDER — CIPROFLOXACIN HCL 500 MG PO TABS: 500.0000 mg | ORAL_TABLET | Freq: Two times a day (BID) | ORAL | Status: AC

## 2011-09-08 MED ORDER — TRAMADOL HCL 50 MG PO TABS: 50.0000 mg | ORAL_TABLET | Freq: Two times a day (BID) | ORAL | Status: DC | PRN

## 2011-09-08 MED ORDER — CYCLOBENZAPRINE HCL 10 MG PO TABS
5.0000 mg | ORAL_TABLET | Freq: Two times a day (BID) | ORAL | Status: DC | PRN
  Administered 2011-09-08: 5 mg via ORAL
  Filled 2011-09-08: qty 1

## 2011-09-08 MED ORDER — CYCLOBENZAPRINE HCL 10 MG PO TABS: 5.0000 mg | ORAL_TABLET | Freq: Two times a day (BID) | ORAL | Status: AC | PRN

## 2011-09-08 MED ORDER — AMLODIPINE BESYLATE 5 MG PO TABS: 5.0000 mg | ORAL_TABLET | Freq: Every day | ORAL | Status: DC

## 2011-09-08 MED ORDER — CARVEDILOL 25 MG PO TABS: 25.0000 mg | ORAL_TABLET | Freq: Two times a day (BID) | ORAL | Status: DC

## 2011-09-08 MED ORDER — WARFARIN SODIUM 7.5 MG PO TABS
7.5000 mg | ORAL_TABLET | Freq: Once | ORAL | Status: DC
  Filled 2011-09-08: qty 1

## 2011-09-08 MED ORDER — INSULIN GLARGINE 100 UNIT/ML ~~LOC~~ SOLN: 15.0000 [IU] | SUBCUTANEOUS | Status: DC

## 2011-09-08 MED ORDER — ACETAMINOPHEN 325 MG PO TABS: 325.0000 mg | ORAL_TABLET | ORAL | Status: AC | PRN

## 2011-09-08 MED ORDER — FENOFIBRATE 160 MG PO TABS
160.0000 mg | ORAL_TABLET | Freq: Every day | ORAL | Status: DC
  Administered 2011-09-08: 160 mg via ORAL
  Filled 2011-09-08: qty 1

## 2011-09-08 MED ORDER — FENOFIBRATE 160 MG PO TABS: 160.0000 mg | ORAL_TABLET | Freq: Every day | ORAL | Status: DC

## 2011-09-08 MED ORDER — HYDROCODONE-ACETAMINOPHEN 5-325 MG PO TABS: 1.0000 | ORAL_TABLET | Freq: Four times a day (QID) | ORAL | Status: DC | PRN

## 2011-09-08 MED ORDER — ACETAMINOPHEN 325 MG PO TABS: 325.0000 mg | ORAL_TABLET | ORAL | Status: DC | PRN

## 2011-09-08 MED ORDER — RIVAROXABAN 20 MG PO TABS: 20.0000 mg | ORAL_TABLET | ORAL | Status: DC

## 2011-09-08 NOTE — Progress Notes (Signed)
Subjective: 34 F with DM, HTN, Hyperlipidemia/Hypertriglyceridemia and other issues admitted by Vasc Surgery for ischemic toe ?embolic. This has happened before and work up was (-). She is on Tele and Hep gtt.  She is deciding b/w Coumadin/Xaralto/Pradaxa. She was on ASA for this current event. No Pain. Some "bruising" into 3rd toe. Coumadin on order. She is doing better.  Objective: Vital signs in last 24 hours: Temp:  [97 F (36.1 C)-97.7 F (36.5 C)] 97 F (36.1 C) (01/17 0500) Pulse Rate:  [69-79] 78  (01/17 0500) Resp:  [18-20] 20  (01/17 0500) BP: (120-158)/(73-91) 138/73 mmHg (01/17 0500) SpO2:  [97 %-100 %] 100 % (01/17 0500) Weight change:  Last BM Date: 09/06/11  CBG (last 3)   Basename 09/08/11 0639 09/07/11 2108 09/07/11 1640  GLUCAP 132* 154* 147*    Intake/Output from previous day:  Intake/Output Summary (Last 24 hours) at 09/08/11 0717 Last data filed at 09/08/11 0600  Gross per 24 hour  Intake    798 ml  Output   1700 ml  Net   -902 ml   01/16 0701 - 01/17 0700 In: 798 [P.O.:600; I.V.:198] Out: 1700 [Urine:1700]   Physical Exam Gen - A+O HR Reg Pulm CTA R 2nd toe - Purple/discolored.  Foot with good pulse.  Decent cap refill. Not cold.  Good ROM.  Lab Results:  [facility] 09/05/11 1632  NA 141  K 3.5  CL 107  CO2 22  GLUCOSE 170*  BUN 17  CREATININE 1.34*  CALCIUM 9.9  MG --  PHOS --    No results found for this basename: AST:2,ALT:2,ALKPHOS:2,BILITOT:2,PROT:2,ALBUMIN:2 in the last 72 hours   Basename 09/08/11 0552 09/07/11 0519 09/06/11 1358  WBC 7.6 10.1 --  NEUTROABS -- -- 5.3  HGB 12.3 13.8 --  HCT 37.1 40.7 --  MCV 85.5 84.8 --  PLT 160 201 --    Lab Results  Component Value Date   INR 1.02 09/08/2011   INR 1.03 09/07/2011   INR 1.02 09/06/2011    No results found for this basename: CKTOTAL:3,CKMB:3,CKMBINDEX:3,TROPONINI:3 in the last 72 hours  No results found for this basename: TSH,T4TOTAL,FREET3,T3FREE,THYROIDAB  in the last 72 hours   Basename 09/06/11 1358  VITAMINB12 --  FOLATE --  FERRITIN --  TIBC --  IRON --  RETICCTPCT 2.3    Micro Results: No results found for this or any previous visit (from the past 240 hour(s)).   Studies/Results: No results found.   Medications: Scheduled:    . amLODipine  5 mg Oral Daily  . carvedilol  25 mg Oral BID WC  . ciprofloxacin  500 mg Oral BID  . cyclobenzaprine  10 mg Oral QHS  . exenatide  5 mcg Subcutaneous Q breakfast  . famotidine  20 mg Oral BID  . fenofibrate  54 mg Oral Daily  . glyBURIDE  2.5 mg Oral BID WC  . insulin aspart  0-15 Units Subcutaneous TID WC  . insulin glargine  15 Units Subcutaneous Q0700  . levothyroxine  150 mcg Oral Q0700  . metFORMIN  1,000 mg Oral BID WC  . olmesartan  40 mg Oral Daily  . potassium chloride  20-40 mEq Oral Once  . topiramate  45 mg Oral QHS  . warfarin  7.5 mg Oral ONCE-1800  . DISCONTD: famotidine (PEPCID) IV  20 mg Intravenous Q12H  . DISCONTD: topiramate  50 mg Oral Once  . DISCONTD: warfarin  7.5 mg Oral ONCE-1800   Continuous:    .  heparin 1,650 Units/hr (09/07/11 1938)     Assessment/Plan: Principal Problem:  *Ischemia of extremity Active Problems:  Pain in toe of right foot  DM (diabetes mellitus), type 2 with peripheral vascular complications  HTN (hypertension)  Hypertriglyceridemia  Hypothyroid  Gout  Migraine headache  History of nephrectomy, unilateral  DM w/o complication type II, uncontrolled  Ischemic Toe - recurrent Thromboembolic event.  Repeat work up in progress per Vasc surgery. On Hep gtt.  Will be d/ced on chronic oral anticoagulation. Ab CT (-). 2d ECHO and Bubble (-): Saw Dr Cyndie Chime and Vasculitis is unlikely. He ordered lots of labs and some still Pending. Her RFs are reduced the best I can do at this time.  Continue Coumadin.  DM2 - CBGs are finally excellent on current regimen.   Basename 09/08/11 0639 09/07/11 2108 09/07/11 1640    GLUCAP 132* 154* 147*   HTN -  Better!  Hyperlipidemia/Hypertriglyceridemia - TG in 400s - increase Fenofibrate  UTI - Cipro for 5 days.  DVT Prophylaxis - Hep gtt  HYPOTHYROIDISM (ICD-244.9) on Synthroid 150 Mcg Tabs (Levothyroxine sodium) .Marland Kitchen... Take one tablet by mouth every morning  Gout - no current evidence  Nephrolithiasis - no Sxs but UA suggests CaOx.  Migraines - topamax  ADD/Hypersomnolence - She takes provigil when she can afford.  I do not believe this has been active recently and will hold.  OA pains - Change Percocet to Tramadol prn and OK for Flexeril prn.   Complete Outpatient Medication List: 1)  Atenolol 100 Mg Tabs (Atenolol) .Marland Kitchen.. 1 tab po qd 2)  Cyclobenzaprine Hcl 10 Mg Tabs (Cyclobenzaprine hcl) .Marland Kitchen.. 1 tab po qhs 3)  Glyburide 2.5 Mg Tabs (Glyburide) .... 2  tab po qd 4)  Synthroid 150 Mcg Tabs (Levothyroxine sodium) .... Take one tablet by mouth every morning 5)  Metformin Hcl 1000 Mg Tabs (Metformin hcl) .... One po bid 6)  Freestyle Test Strp (Glucose blood) .... Test 1 x per day 7)  Colcrys 0.6 Mg Tabs (Colchicine) .Marland Kitchen.. 1 po bid prn 8)  Byetta 5 Mcg Pen 5 Mcg/0.59ml Soln (Exenatide) .... 5 mcg daily with dinner for the first week then bid with meals after. Efraim Kaufmann showed her how to use it. sample given. 9)  Bd Pen Needle Nano U/f 32g X 4 Mm Misc (Insulin pen needle) .... Use 2 needles per day 12)  Benicar 40 Mg Tabs (Olmesartan medoxomil) .... 1/2 - 1 po daily - samples given   LOS: 3 days   Cathlin Buchan M 09/08/2011, 7:17 AM

## 2011-09-08 NOTE — Progress Notes (Signed)
Lab so far does not show evidence of an obvious congenital or acquired coagulopathy. Borderline decrease in functional protein S activity is insignificant. Hepatitis studies are negative. ANA & RF negative. No monoclonal proteins in serum. Additional studies (antiphosholipid antibodies) pending. Patient does not want to go on coumadin since she is a vegetarian. I am recommending Xarelto (Rivaroxaban) the recently approved direct Xa inhibitor, 20 mg daily. No lab monitoring needed. No interaction with food.  Minimal interaction with certain cardiac and antifungal meds and anti-retroviral drugs which is not a concern in this patient. Continue low dose ASA. I will follow up as outpatient to review outstanding labs. Xarelto is therapeutic about 6 hours after first dose.  Heparin can be stopped at that point. Discussed above with physician assistant for Vascular Service.

## 2011-09-08 NOTE — Discharge Instructions (Signed)
Coronary Artery Disease, Risk Factors Research has shown that the risk of developing coronary artery disease (CAD) and having a heart attack increases with each factor you have. RISK FACTORS YOU CANNOT CHANGE  Your age. Your risk goes up as you get older. Most heart attacks happen to people over the age of 65.   Gender. Men have a greater risk of heart attack than women, and they have attacks earlier in life. However, women are more likely to die from a heart attack.   Heredity. Children of parents with heart disease are more likely to develop it themselves.   Race. African Americans and other ethnic groups have a higher risk, possibly because of high blood pressure, a tendency toward obesity, and diabetes.   Your family. Most people with a strong family history of heart disease have one or more other risk factors.  RISK FACTORS YOU CAN CHANGE  Exposure to tobacco smoke. Even secondhand smoke greatly increases the risk for heart disease.   High blood cholesterol may be lowered with changes in diet, activity, and medicines.   High blood pressure makes the heart work harder. This causes the heart muscles to become thick and, eventually, weaker. It also increases your risk of stroke, heart attack, and kidney or heart failure.   Physical inactivity is a risk factor for CAD. Regular physical activity helps prevent heart and blood vessel disease. Exercise helps control blood cholesterol, diabetes, obesity, and it may help lower blood pressure in some people.   Excess body fat, especially belly fat, increases the risk of heart disease and stroke even if there are no other risk factors. Excess weight increases the heart's workload and raises blood pressure and blood cholesterol.   Diabetes seriously increases your risk of developing CAD. If you have diabetes, you should work with your caregiver to manage it and control other risk factors.  OTHER RISK FACTORS FOR CAD  How you respond to stress.     Drinking too much alcohol may raise blood pressure, cause heart failure, and lead to stroke.   Total cholesterol greater than 200 milligrams.   HDL (good) cholesterol less than 40 milligrams. HDL helps keep cholesterol from building up in the walls of the arteries.  PREVENTING CAD  Maintain a healthy weight.   Exercise or do physical activity.   Eat a heart-healthy diet low in fat and salt and high in fiber.   Control your blood pressure to keep it below 120 over 80.   Keep your cholesterol at a level that lowers your risk.   Manage diabetes if you have it.   Stop smoking.   Learn how to manage stress.  HEART SMART SUBSTITUTIONS  Instead of whole or 2% milk and cream, use skim milk.   Instead of fried foods, eat baked, steamed, boiled, broiled, or microwaved foods.   Instead of lard, butter, palm and coconut oils, cook with unsaturated vegetable oils, such as corn, olive, canola, safflower, sesame, soybean, sunflower, or peanut.   Instead of fatty cuts of meat, eat lean cuts of meat or cut off the fatty parts.   Instead of 1 whole egg in recipes, use 2 egg whites.   Instead of sauces, butter, and salt, season vegetables with herbs and spices.   Instead of regular hard and processed cheeses, eat low-fat, low-sodium cheeses.   Instead of salted potato chips, choose low-fat, unsalted tortilla and potato chips and unsalted pretzels and popcorn.   Instead of sour cream and mayonnaise, use plain   low-fat yogurt, low-fat cottage cheese, or low-fat or "light" sour cream.  FOR MORE INFORMATION  National Heart Lung and Blood Institute: www.nhlbi.nih.gov/health/hearttruth American Heart Association: www.heart.org/HEARTORG Document Released: 10/29/2003 Document Revised: 04/20/2011 Document Reviewed: 10/24/2007 Exit[employer] Patient Information 2012 [employer]. 

## 2011-09-08 NOTE — Progress Notes (Addendum)
PT D/C INSTRUCTIONS REVIEWED. PT VERBALIZED UNDERSTANDING OF ALL INSTRUCTIONS. SCRIPTS WERE CALLED INTO PT'S PHARMACY BY MD. CASE MANAGER, SUSAN BRADY SAW PT TODAY AND WAS ABLE TO GIVE A PHARMACY CARD FOR 10 DAY SUPPLY OF XARELTO/RIVAROXABAN, TO HELP WITH  MEDS. PT GIVEN COPY OF ALL INSTRUCTIONS, PT HAS BEEN TAUGHT HOW TO GIVE INSULIN WITH INSULIN PEN, AS SHE ALSO GIVES HERSELF HER  BYETTA   PEN INJECTIONS AT HOME AND DOES WELL.  PT D/C'D VIA WHEELCHAIR WITH BELONGINGS, ESCORTED BY HOSPITAL VOLUNTEER.  PT HAD EDUCATION ON INSULIN INJECTIONS, AND MEDICATIONS.

## 2011-09-08 NOTE — Discharge Summary (Signed)
[facility] Discharge Summary   Patient ID:  Debra Jordan MRN: [id number] DOB/AGE: Apr 23, 1951 61 y.o.  Admit date: 09/05/2011 Discharge date: 09/08/2011 Date of Surgery: * No surgery found * Surgeon:   Admission Diagnosis: hyperlipdemia/r 2nd toe embolis  Discharge Diagnoses:  hyperlipdemia/r 2nd toe embolis  Secondary Diagnoses: Past Medical History  Diagnosis Date  . Hypertension   . Hyperlipidemia   . Arthritis     gout  . ADD (attention deficit disorder)   . History of headache   . Trouble swallowing   . Seizure disorder   . History of venous thrombosis and embolism     Left great toe  . Hypothyroidism   . Chronic kidney disease     hx of left nephrectomy  . Diabetes mellitus     type 2  . Neuromuscular disorder     neuropathy to feet bilateral  . Fibromyalgia   . Ischemia of extremity 09/06/2011  . Hypothyroid 09/06/2011  . Gout 09/06/2011  . Migraine headache 09/06/2011  . History of nephrectomy, unilateral 09/06/2011  . DM w/o complication type II, uncontrolled 09/06/2011    Procedures:   Discharged Condition: good  HPI: Right recurrent thromboembolic event with right second toe bruising    Hospital Course:  Debra Jordan is a 61 y.o. female     Pt. Ambulating, voiding and taking PO diet without difficulty. Pt pain controlled with PO pain meds. Labs as below Palp DP pulse biphsic with decreased sensation bil. LE.  Known peripheral neuropathy.   Consults:  Treatment Team:  Debra Feinstein, MD  Significant Diagnostic Studies: CBC    Component Value Date/Time   WBC 7.6 09/08/2011 0552   RBC 4.34 09/08/2011 0552   HGB 12.3 09/08/2011 0552   HCT 37.1 09/08/2011 0552   PLT 160 09/08/2011 0552   MCV 85.5 09/08/2011 0552   MCH 28.3 09/08/2011 0552   MCHC 33.2 09/08/2011 0552   RDW 14.3 09/08/2011 0552   LYMPHSABS 2.3 09/06/2011 1358   MONOABS 0.7 09/06/2011 1358   EOSABS 0.2 09/06/2011 1358   BASOSABS 0.1 09/06/2011 1358     BMET    Component Value Date/Time   NA 141 09/05/2011 1632   K 3.5 09/05/2011 1632   CL 107 09/05/2011 1632   CO2 22 09/05/2011 1632   GLUCOSE 170* 09/05/2011 1632   BUN 17 09/05/2011 1632   CREATININE 1.34* 09/05/2011 1632   CALCIUM 9.9 09/05/2011 1632   GFRNONAA 43* 09/05/2011 1632   GFRAA 50* 09/05/2011 1632    COAG Lab Results  Component Value Date   INR 1.02 09/08/2011   INR 1.03 09/07/2011   INR 1.02 09/06/2011     Disposition:  Discharge to :Home  Discharge Orders    Future Orders Please Complete By Expires   Resume previous diet      Call MD for:  temperature >100.5      Call MD for:  redness, tenderness, or signs of infection (pain, swelling, bleeding, redness, odor or green/yellow discharge around incision site)      Call MD for:  severe or increased pain, loss or decreased feeling  in affected limb(s)      Increase activity slowly      Comments:   Walk with assistance use walker or cane as needed      Debra, Jordan  Home Medication Instructions [id number]   Printed on:09/08/11 6578  Medication Information  glyBURIDE (DIABETA) 2.5 MG tablet Take 2.5 mg by mouth 2 (two) times daily with a meal.            levothyroxine (SYNTHROID, LEVOTHROID) 150 MCG tablet Take 150 mcg by mouth every morning.            metFORMIN (GLUCOPHAGE) 1000 MG tablet Take 1,000 mg by mouth 2 (two) times daily with a meal.           atenolol (TENORMIN) 100 MG tablet Take 100 mg by mouth at bedtime.            exenatide (BYETTA) 5 MCG/0.02ML SOLN Inject 5 mcg into the skin daily.           aspirin 325 MG tablet Take 325 mg by mouth at bedtime.            topiramate (TOPAMAX) 15 MG capsule Take 45 mg by mouth every evening.            Rivaroxaban 20 MG TABS Take 20 mg by mouth 1 day or 1 dose.           HYDROcodone-acetaminophen (NORCO) 5-325 MG per tablet Take 1 tablet by mouth every 6 (six) hours as needed for pain.            Verbal and written  Discharge instructions given to the patient. Wound care per Discharge AVS Follow-up Information    Follow up with Debra Jordan, Debra Lund, MD in 2 weeks.   Contact information:   [address] [place] [address] [phone]          Signed: Mosetta Pigeon 09/08/2011, 9:26 AM

## 2011-09-08 NOTE — Progress Notes (Signed)
ANTICOAGULATION CONSULT NOTE - Follow Up Consult  Pharmacy Consult for heparin + coumadin Indication: recurrent ischemic toe  Vital Signs: Temp: 97 F (36.1 C) (01/17 0500) Temp src: Oral (01/17 0500) BP: 138/73 mmHg (01/17 0500) Pulse Rate: 78  (01/17 0500)  Labs:  Basename 09/08/11 0552 09/07/11 0519 09/06/11 2056 09/06/11 1505 09/06/11 1358 09/05/11 1632  HGB 12.3 13.8 -- -- -- --  HCT 37.1 40.7 -- -- 38.7 --  PLT 160 201 -- -- 197 --  APTT -- -- -- -- 45* --  LABPROT 13.6 13.7 -- 13.6 -- --  INR 1.02 1.03 -- 1.02 -- --  HEPARINUNFRC 0.50 0.28* 0.30 -- -- --  CREATININE -- -- -- -- -- 1.34*  CKTOTAL -- -- -- -- -- --  CKMB -- -- -- -- -- --  TROPONINI -- -- -- -- -- --   Estimated Creatinine Clearance: 50.9 ml/min (by C-G formula based on Cr of 1.34).  Assessment: 60 yof on heparin and coumadin for recurrent ischemic toe. Heparin level this AM is at goal at 0.5. INR is subtherapeutic as expected after her first dose of coumadin last night. Anticoagulation plans not finalized yet. Continued "bruising" on her toe but continuing with current plan for now. H/H + plts down a bit today but no signs of overt bleeding reported.   Goal of Therapy:  INR 2-3 Heparin level 0.3-0.7 units/ml   Plan:  1. Continue heparin at 1650units/hr 2. Repeat coumadin 7.5mg  PO x 1 tonight 3. F/u AM INR and heparin level 4. F/u anticoagulation plan  Sangita Zani, Drake Leach 09/08/2011,8:35 AM

## 2011-09-09 LAB — FACTOR 5 LEIDEN

## 2011-09-11 LAB — CRYOGLOBULIN

## 2011-09-14 ENCOUNTER — Telehealth: Payer: Self-pay | Admitting: Oncology

## 2011-09-14 NOTE — Telephone Encounter (Signed)
Called pt ,left message to call us back to confirm appt. °

## 2011-09-15 ENCOUNTER — Telehealth: Payer: Self-pay | Admitting: Oncology

## 2011-09-15 NOTE — Telephone Encounter (Signed)
Talked to pt, gave her appt date on 10/05/11 lab ,MD and Financial, she is aware of location and telephone number.

## 2011-09-16 NOTE — Discharge Summary (Signed)
Agee with above, will re-evar toe in 2 weeks  Durene Cal

## 2011-09-23 ENCOUNTER — Encounter: Payer: Self-pay | Admitting: Surgery

## 2011-09-26 ENCOUNTER — Ambulatory Visit (INDEPENDENT_AMBULATORY_CARE_PROVIDER_SITE_OTHER): Payer: 59 | Admitting: Surgery

## 2011-09-26 ENCOUNTER — Encounter: Payer: Self-pay | Admitting: Surgery

## 2011-09-26 VITALS — BP 111/77 | HR 89 | Resp 18 | Ht 64.0 in | Wt 207.0 lb

## 2011-09-26 DIAGNOSIS — I743 Embolism and thrombosis of arteries of the lower extremities: Secondary | ICD-10-CM

## 2011-09-26 NOTE — Progress Notes (Signed)
[facility] [place]   Patient name: Debra Jordan MRN: [id number] DOB: 11/05/1950 Sex: female     Chief Complaint  Patient presents with  . Follow-up    Pt states that her toes are pink, No pain in toes but she still has pain in her feet and legs.    HISTORY OF PRESENT ILLNESS: The patient is here today for followup of a right blue second toe. This was her second embolic-type affect. Previously she had issues with the left great toe. She was admitted to the hospital for anticoagulation. She was seen by hematology and underwent full blood panel workup. She has a known history of hypertriglyceridemia which at this point is the most likely explanation. She was discharged to home on [address], as she did not wish to take Coumadin because she is a vegetarian. She underwent an extensive workup in the hospital which included a echo bubble study which was negative, a CT scan of the chest abdomen and pelvis which was unremarkable and multiple blood studies. She is here today for followup of her toe. She no longer has pain or discoloration in the toe. Chief states that she feels much better and no longer has headaches and her energy has improved.  Past Medical History  Diagnosis Date  . Hypertension   . Hyperlipidemia   . Arthritis     gout  . ADD (attention deficit disorder)   . History of headache   . Trouble swallowing   . Seizure disorder   . History of venous thrombosis and embolism     Left great toe  . Hypothyroidism   . Chronic kidney disease     hx of left nephrectomy  . Diabetes mellitus     type 2  . Neuromuscular disorder     neuropathy to feet bilateral  . Fibromyalgia   . Ischemia of extremity 09/06/2011  . Hypothyroid 09/06/2011  . Gout 09/06/2011  . Migraine headache 09/06/2011  . History of nephrectomy, unilateral 09/06/2011  . DM w/o complication type II, uncontrolled 09/06/2011    Past Surgical History  Procedure Date  . Nephrectomy     left  nephrectomy  . Hernia repair   . Tubal ligation   . Abdominal hysterectomy   . Tonsillectomy   . Lymph node biopsy     History   Social History  . Marital Status: Widowed    Spouse Name: N/A    Number of Children: N/A  . Years of Education: N/A   Occupational History  . Not on file.   Social History Main Topics  . Smoking status: Never Smoker   . Smokeless tobacco: Never Used  . Alcohol Use: No  . Drug Use: No  . Sexually Active: No   Other Topics Concern  . Not on file   Social History Narrative  . No narrative on file    Family History  Problem Relation Age of Onset  . Stroke Mother     ruptured intracranial aneurysm  . Other Mother     blood clot history, varicose veins  . Cancer Father   . Stroke Maternal Grandmother     Allergies as of 09/26/2011 - Review Complete 09/26/2011  Allergen Reaction Noted  . Penicillins  09/05/2011  . Statins  09/05/2011  . Sulfa antibiotics  09/05/2011  . Latex Rash 09/05/2011    Current Outpatient Prescriptions on File Prior to Visit  Medication Sig Dispense Refill  . acetaminophen (TYLENOL) 325 MG tablet Take  1 tablet (325 mg total) by mouth every 4 (four) hours as needed for pain.      Marland Kitchen amLODipine (NORVASC) 5 MG tablet Take 1 tablet (5 mg total) by mouth daily.      Marland Kitchen aspirin 325 MG tablet Take 325 mg by mouth at bedtime.       . carvedilol (COREG) 25 MG tablet Take 1 tablet (25 mg total) by mouth 2 (two) times daily with a meal.      . exenatide (BYETTA) 5 MCG/0.02ML SOLN Inject 5 mcg into the skin daily.      . fenofibrate 160 MG tablet Take 1 tablet (160 mg total) by mouth daily.      Marland Kitchen glyBURIDE (DIABETA) 2.5 MG tablet Take 2.5 mg by mouth 2 (two) times daily with a meal.       . insulin glargine (LANTUS) 100 UNIT/ML injection Inject 15 Units into the skin every morning.  10 mL    . levothyroxine (SYNTHROID, LEVOTHROID) 150 MCG tablet Take 150 mcg by mouth every morning.       . metFORMIN (GLUCOPHAGE) 1000 MG  tablet Take 1,000 mg by mouth 2 (two) times daily with a meal.      . olmesartan (BENICAR) 40 MG tablet Take 1 tablet (40 mg total) by mouth daily.      Marland Kitchen topiramate (TOPAMAX) 15 MG capsule Take 45 mg by mouth every evening.          REVIEW OF SYSTEMS: See previous note. No changes in with the exception of improvement in her right great toe, improvement in her energy level, and decrease in headaches.  PHYSICAL EXAMINATION:   Vital signs are BP 111/77  Pulse 89  Resp 18  Ht 5\' 4"  (1.626 m)  Wt 207 lb (93.895 kg)  BMI 35.53 kg/m2  SpO2 97% General: The patient appears their stated age. HEENT:  No gross abnormalities Pulmonary:  Non labored breathing Musculoskeletal: There are no major deformities. Neurologic: No focal weakness or paresthesias are detected, Skin: There are no ulcer or rashes noted. Psychiatric: The patient has normal affect. Cardiovascular: There is no longer bluish discoloration of the right second toe. She continues to have palpable pedal pulses.  Diagnostic Studies None  Assessment: Right blue toe Plan: The patient is currently being managed by hematology for anticoagulation as well as continued blood workup to determine the etiology of her second embolus. To date the only significant finding has been continued hyperlipidemia. I will defer management to hematology for the duration of anticoagulation however I suspect this will be lifelong. The patient is doing very well at this time and has made significant recovery. L. plan on seeing her on a when necessary basis  V. Charlena Cross, M.D. [facility] [place] Office: [phone] Pager:  [phone]

## 2011-09-30 ENCOUNTER — Encounter: Payer: Self-pay | Admitting: Surgery

## 2011-10-05 ENCOUNTER — Ambulatory Visit: Payer: 59

## 2011-10-05 ENCOUNTER — Ambulatory Visit (HOSPITAL_BASED_OUTPATIENT_CLINIC_OR_DEPARTMENT_OTHER): Payer: 59 | Admitting: Oncology

## 2011-10-05 ENCOUNTER — Other Ambulatory Visit: Payer: 59 | Admitting: Lab

## 2011-10-05 VITALS — BP 127/78 | HR 95 | Temp 98.9°F | Ht 64.0 in | Wt 201.4 lb

## 2011-10-05 DIAGNOSIS — I75029 Atheroembolism of unspecified lower extremity: Secondary | ICD-10-CM

## 2011-10-06 ENCOUNTER — Encounter: Payer: Self-pay | Admitting: Oncology

## 2011-10-06 DIAGNOSIS — I75029 Atheroembolism of unspecified lower extremity: Secondary | ICD-10-CM

## 2011-10-06 HISTORY — DX: Atheroembolism of unspecified lower extremity: I75.029

## 2011-10-06 NOTE — Progress Notes (Signed)
Hematology and Oncology Follow Up Visit  Debra Jordan [id number] 02-Dec-1950 61 y.o. 10/06/2011 7:14 AM   Principle Diagnosis: Encounter Diagnosis  Name Primary?  Marland Kitchen Blue toe syndrome Yes     Interim History:   First posthospital visit for this 61 year old woman I recently saw in consultation at [facility] on January 14. Please see my consultation note for full details. She has hypertension, type 2 diabetes, and mixed hyperlipidemia with hypertriglyceridemia. She has suffered 2 ischemic events to her distal extremities. Initial event in March 2012 when she developed acute pain and cyanotic changes of her left great toe. Extensive evaluation including echocardiogram with a bubble study, MR angiogram, all normal. She was started on aspirin. She had a second event affecting her second right toe on January 14. She was hospitalized and put on full dose anticoagulation with heparin. Ischemic changes resolved. She is a never smoker who does not use alcohol. There is no family history of thrombosis although her mother died at age 106 of complications of Goodpasture's syndrome. Debra Jordan has no signs or symptoms of a collagen vascular disorder. She had a normal vascular exam at the time I saw her on January 15 except I was unable to palpate a posterior tibial pulse on the right. I was asked to see her to exclude a coagulopathy as potential etiology of her ischemic events.  At time of discharge she elected to go on Xarelto as an anticoagulant instead of warfarin since she is a vegetarian. She also remains on aspirin 325 mg daily. Other current medications include Norvasc 5 mg daily Coreg 25 mg twice a day, Byetta 5 mg subcutaneous daily, glyburide 2.5 mg twice a day, Lantus insulin 15 units every morning, Synthroid 150 mcg daily, Glucophage 1000 mg twice a day, Benicar 40 mg daily, Xarelto 20 mg daily, Topamax 15 mg 3 tablets at bedtime, as needed Flexeril, as needed colcrys.  Special hematology  studies done to date have been unrevealing. She has a borderline decrease in functional protein S. level 68% of control with normal range 69-129%. Protein C activity normal at 153% of control, (75-133), she tests negative for the prothrombin gene and factor. V Leiden gene mutations, antithrombin not tested since she was on heparin and currently on a direct X A. Inhibitor. Negative anticardiolipin antibodies, negative antibodies to beta 2 glycoprotein 1. Lupus anticoagulant assay could not be done since it is a PTT-based test and  she was on heparin. I checked her for hepatitis A, B,C and cryoglobulins given her husband's history of hepatitis C which resulted in his death. She tests negative for all of these. ANA and rheumatoid factor are negative. ESR normal at 16 mm. Serum immunoglobulins are normal except for a slightly depressed IgM of questionable significance. I do not see the result of an immunofixation electrophoresis. Baseline CBC does not suggest a myeloproliferative disorder with hemoglobin 12 hematocrit 37 white count 7600 62% neutrophils 27 lymphocytes platelet count 160,000. No evidence for a hemolytic process such as PNH with normal LDH 215, reticulocyte count 2.3%, I do not find a bilirubin recorded.  She has had no recurrent ischemic events since hospital discharge. She does note increased bruisability on the Xarelto but is otherwise tolerating the drug well. No clinical bleeding.  Medications: reviewed  Allergies:  Allergies  Allergen Reactions  . Penicillins   . Statins   . Sulfa Antibiotics   . Latex Rash    Occasional SOB    Physical Exam: Blood pressure 127/78,  pulse 95, temperature 98.9 F (37.2 C), temperature source Oral, height 5\' 4"  (1.626 m), weight 201 lb 6.4 oz (91.354 kg). Wt Readings from Last 3 Encounters:  10/05/11 201 lb 6.4 oz (91.354 kg)  09/26/11 207 lb (93.895 kg)  09/05/11 207 lb 0.2 oz (93.9 kg)     General appearance: Pleasant overweight Caucasian  woman HENNT:  Lymph nodes:  Breasts: Lungs: Heart: Abdomen: Extremities: Vascular: Radial pulses are 2+ and symmetric I was unable to palpate ulnar pulses today but did palpate symmetrical 1+ ulnar pulses when she was in the hospital. Dorsalis pedis pulses remain 2+ symmetric. I was unable to palpate posterior tibial pulses today. There are no ischemic changes of her distal extremities at this time. Neurologic: Skin:  Lab Results: Lab Results  Component Value Date   WBC 7.6 09/08/2011   HGB 12.3 09/08/2011   HCT 37.1 09/08/2011   MCV 85.5 09/08/2011   PLT 160 09/08/2011     Chemistry      Component Value Date/Time   NA 141 09/05/2011 1632   K 3.5 09/05/2011 1632   CL 107 09/05/2011 1632   CO2 22 09/05/2011 1632   BUN 17 09/05/2011 1632   CREATININE 1.34* 09/05/2011 1632      Component Value Date/Time   CALCIUM 9.9 09/05/2011 1632       Radiological Studies: Please refer to studies done at [facility] and at the vascular lab   Impression and Plan: Idiopathic arterial ischemic events affecting bilateral distal extremities presumed at this point in time to be due to cholesterol or hypertriglyceridemia emboli. No clear evidence for underlying coagulopathy or a collagen vascular disorder/vasculitis at this time. Recommendation: Under the circumstances I feel she is obligated to continue long-term anticoagulation with both an antiplatelet agent and another anticoagulant that affects clotting factors. I told her I would be happy to see her again in the future if other problems develop related to her clotting. It will be good to repeat the protein S. functional level and a free protein S at an interval. The current borderline decrease protein S. level is likely unrelated to her recurrent ischemic events. Patients with true protein S deficiency have levels in the 20-30% range. Her level is 68% with low normal control 69%. She has close followup with Dr. Timothy Lasso and will ask him to  repeat the protein S in 4-6 months.   CC:. Dr. Creola Corn Dr.Wells Twanna Hy, MD 2/14/20137:14 AM

## 2012-01-19 ENCOUNTER — Telehealth: Payer: Self-pay

## 2012-01-19 NOTE — Telephone Encounter (Signed)
Received fax from Dr Corliss Marcus, DDS for advice regarding tooth extraction.  Returned fax with Dr Patsy Lager comments:  "She is on Xarelto; Xa inhibitor oral anticoagulant.  No established guidelines for dental procedures.  I would have her hold 1 dose day before extraction, then resume 6 hours post procedure."    dph

## 2014-03-13 ENCOUNTER — Other Ambulatory Visit: Payer: Self-pay

## 2014-07-01 ENCOUNTER — Other Ambulatory Visit: Payer: Self-pay | Admitting: Internal Medicine

## 2014-07-01 DIAGNOSIS — M5412 Radiculopathy, cervical region: Secondary | ICD-10-CM

## 2014-07-02 ENCOUNTER — Ambulatory Visit
Admission: RE | Admit: 2014-07-02 | Discharge: 2014-07-02 | Disposition: A | Discharge status: Home / Self Care | Payer: 59 | Source: Ambulatory Visit | Attending: Internal Medicine | Admitting: Internal Medicine

## 2014-07-02 DIAGNOSIS — M5412 Radiculopathy, cervical region: Secondary | ICD-10-CM

## 2014-08-09 ENCOUNTER — Encounter (HOSPITAL_COMMUNITY): Payer: Self-pay | Admitting: Emergency Medicine

## 2014-08-09 ENCOUNTER — Emergency Department (HOSPITAL_COMMUNITY)
Admission: EM | Admit: 2014-08-09 | Discharge: 2014-08-09 | Disposition: A | Discharge status: Home / Self Care | Payer: 59 | Attending: Emergency Medicine | Admitting: Emergency Medicine

## 2014-08-09 DIAGNOSIS — Z794 Long term (current) use of insulin: Secondary | ICD-10-CM | POA: Insufficient documentation

## 2014-08-09 DIAGNOSIS — M109 Gout, unspecified: Secondary | ICD-10-CM | POA: Insufficient documentation

## 2014-08-09 DIAGNOSIS — Z79899 Other long term (current) drug therapy: Secondary | ICD-10-CM | POA: Insufficient documentation

## 2014-08-09 DIAGNOSIS — E119 Type 2 diabetes mellitus without complications: Secondary | ICD-10-CM | POA: Insufficient documentation

## 2014-08-09 DIAGNOSIS — Z86718 Personal history of other venous thrombosis and embolism: Secondary | ICD-10-CM | POA: Diagnosis not present

## 2014-08-09 DIAGNOSIS — E039 Hypothyroidism, unspecified: Secondary | ICD-10-CM | POA: Diagnosis not present

## 2014-08-09 DIAGNOSIS — I129 Hypertensive chronic kidney disease with stage 1 through stage 4 chronic kidney disease, or unspecified chronic kidney disease: Secondary | ICD-10-CM | POA: Diagnosis not present

## 2014-08-09 DIAGNOSIS — N189 Chronic kidney disease, unspecified: Secondary | ICD-10-CM | POA: Insufficient documentation

## 2014-08-09 DIAGNOSIS — G43909 Migraine, unspecified, not intractable, without status migrainosus: Secondary | ICD-10-CM | POA: Diagnosis not present

## 2014-08-09 DIAGNOSIS — Z7901 Long term (current) use of anticoagulants: Secondary | ICD-10-CM | POA: Diagnosis not present

## 2014-08-09 DIAGNOSIS — Z91040 Latex allergy status: Secondary | ICD-10-CM | POA: Insufficient documentation

## 2014-08-09 DIAGNOSIS — M797 Fibromyalgia: Secondary | ICD-10-CM | POA: Diagnosis not present

## 2014-08-09 DIAGNOSIS — Z8659 Personal history of other mental and behavioral disorders: Secondary | ICD-10-CM | POA: Insufficient documentation

## 2014-08-09 DIAGNOSIS — Z880 Allergy status to penicillin: Secondary | ICD-10-CM | POA: Insufficient documentation

## 2014-08-09 DIAGNOSIS — R040 Epistaxis: Secondary | ICD-10-CM | POA: Diagnosis not present

## 2014-08-09 DIAGNOSIS — M1990 Unspecified osteoarthritis, unspecified site: Secondary | ICD-10-CM | POA: Insufficient documentation

## 2014-08-09 LAB — CBC WITH DIFFERENTIAL/PLATELET
Basophils Absolute: 0 10*3/uL (ref 0.0–0.1)
Basophils Relative: 1 % (ref 0–1)
Eosinophils Absolute: 0.2 10*3/uL (ref 0.0–0.7)
Eosinophils Relative: 3 % (ref 0–5)
HCT: 40.9 % (ref 36.0–46.0)
Hemoglobin: 13.8 g/dL (ref 12.0–15.0)
Lymphocytes Relative: 39 % (ref 12–46)
Lymphs Abs: 3 10*3/uL (ref 0.7–4.0)
MCH: 28.7 pg (ref 26.0–34.0)
MCHC: 33.7 g/dL (ref 30.0–36.0)
MCV: 85 fL (ref 78.0–100.0)
Monocytes Absolute: 0.6 10*3/uL (ref 0.1–1.0)
Monocytes Relative: 7 % (ref 3–12)
Neutro Abs: 3.9 10*3/uL (ref 1.7–7.7)
Neutrophils Relative %: 50 % (ref 43–77)
Platelets: 185 10*3/uL (ref 150–400)
RBC: 4.81 MIL/uL (ref 3.87–5.11)
RDW: 13.4 % (ref 11.5–15.5)
WBC: 7.7 10*3/uL (ref 4.0–10.5)

## 2014-08-09 MED ORDER — OXYMETAZOLINE HCL 0.05 % NA SOLN
1.0000 | Freq: Two times a day (BID) | NASAL | Status: DC
  Administered 2014-08-09: 1 via NASAL
  Filled 2014-08-09: qty 15

## 2014-08-09 MED ORDER — DOXYCYCLINE HYCLATE 100 MG PO CAPS: 100.0000 mg | ORAL_CAPSULE | Freq: Two times a day (BID) | ORAL | Status: DC

## 2014-08-09 NOTE — ED Notes (Signed)
Family at bedside. 

## 2014-08-09 NOTE — ED Provider Notes (Signed)
CSN: [id number]     Arrival date & time 08/09/14  0103 History  This chart was scribed for Debra Balls, MD by Marlowe Kays, ED Scribe. This patient was seen in room D30C/D30C and the patient's care was started at 1:35 AM.    Chief Complaint  Patient presents with  . Epistaxis   Patient is a 63 y.o. female presenting with nosebleeds. The history is provided by the patient. No language interpreter was used.  Epistaxis Associated symptoms: no fever     HPI Comments:  Debra Jordan is a 63 y.o. female who presents to the Emergency Department complaining of  severe epistaxis for the past two hours. She reports she is currently taking Xarelto for bilateral DVTs in feet and also because "her blood is thick". Pt reports she has had mild epistaxis this winter from the dry heat but reports this is the worst nose bleed she has every had. She reports she can feel the blood going down the back of her throat. Denies fever, chills, nausea or vomiting. PMH of HTN, HLD, ADD, CKD, DM, fibromyalgia and hypothyroid.   Past Medical History  Diagnosis Date  . Hypertension   . Hyperlipidemia   . Arthritis     gout  . ADD (attention deficit disorder)   . History of headache   . Trouble swallowing   . Seizure disorder   . History of venous thrombosis and embolism     Left great toe  . Hypothyroidism   . Chronic kidney disease     hx of left nephrectomy  . Diabetes mellitus     type 2  . Neuromuscular disorder     neuropathy to feet bilateral  . Fibromyalgia   . Ischemia of extremity 09/06/2011  . Hypothyroid 09/06/2011  . Gout 09/06/2011  . Migraine headache 09/06/2011  . History of nephrectomy, unilateral 09/06/2011  . DM w/o complication type II, uncontrolled 09/06/2011  . Blue toe syndrome 10/06/2011   Past Surgical History  Procedure Laterality Date  . Nephrectomy      left nephrectomy  . Hernia repair    . Tubal ligation    . Abdominal hysterectomy    . Tonsillectomy    . Lymph node  biopsy     Family History  Problem Relation Age of Onset  . Stroke Mother     ruptured intracranial aneurysm  . Other Mother     blood clot history, varicose veins  . Cancer Father   . Stroke Maternal Grandmother    History  Substance Use Topics  . Smoking status: Never Smoker   . Smokeless tobacco: Never Used  . Alcohol Use: No   OB History    No data available     Review of Systems  Constitutional: Negative for fever and chills.  HENT: Positive for nosebleeds.   Gastrointestinal: Negative for nausea and vomiting.  All other systems reviewed and are negative.   Allergies  Penicillins; Statins; Sulfa antibiotics; and Latex  Home Medications   Prior to Admission medications   Medication Sig Start Date End Date Taking? Authorizing Provider  glyBURIDE (DIABETA) 2.5 MG tablet Take 5 mg by mouth 2 (two) times daily with a meal.    Yes Historical Provider, MD  HYDROcodone-acetaminophen (NORCO/VICODIN) 5-325 MG per tablet Take 1 tablet by mouth daily.  07/28/14  Yes Historical Provider, MD  insulin glargine (LANTUS) 100 UNIT/ML injection Inject 15 Units into the skin every morning. Patient taking differently: Inject 10 Units into the skin  2 (two) times daily.  09/08/11 08/09/14 Yes Precious Reel, MD  insulin lispro (HUMALOG) 100 UNIT/ML injection Inject 3-15 Units into the skin 3 (three) times daily before meals. Sliding scale   Yes Historical Provider, MD  levothyroxine (SYNTHROID, LEVOTHROID) 150 MCG tablet Take 150 mcg by mouth every morning.    Yes Historical Provider, MD  losartan (COZAAR) 50 MG tablet Take 50 mg by mouth daily.  08/04/14  Yes Historical Provider, MD  methocarbamol (ROBAXIN) 500 MG tablet Take 500 mg by mouth every 6 (six) hours as needed for muscle spasms.  07/28/14  Yes Historical Provider, MD  Rivaroxaban 20 MG TABS Take 20 mg by mouth daily with supper. XARLETO 09/08/11  Yes Ulyses Amor, PA-C  amLODipine (NORVASC) 5 MG tablet Take 1 tablet (5 mg total) by  mouth daily. Patient not taking: Reported on 08/09/2014 09/08/11 09/07/12  Precious Reel, MD  aspirin 325 MG tablet Take 325 mg by mouth at bedtime.     Historical Provider, MD  carvedilol (COREG) 25 MG tablet Take 1 tablet (25 mg total) by mouth 2 (two) times daily with a meal. Patient not taking: Reported on 08/09/2014 09/08/11 09/07/12  Precious Reel, MD  COLCRYS 0.6 MG tablet Take 0.6 mg by mouth as needed. 06/30/11   Historical Provider, MD  cyclobenzaprine (FLEXERIL) 10 MG tablet Take 10 mg by mouth at bedtime.     Historical Provider, MD  exenatide (BYETTA) 5 MCG/0.02ML SOLN Inject 5 mcg into the skin daily.    Historical Provider, MD  fenofibrate 160 MG tablet Take 1 tablet (160 mg total) by mouth daily. Patient not taking: Reported on 08/09/2014 09/08/11 09/07/12  Precious Reel, MD  metFORMIN (GLUCOPHAGE) 1000 MG tablet Take 1,000 mg by mouth 2 (two) times daily with a meal.    Historical Provider, MD  olmesartan (BENICAR) 40 MG tablet Take 1 tablet (40 mg total) by mouth daily. Patient not taking: Reported on 08/09/2014 09/08/11 09/07/12  Precious Reel, MD  topiramate (TOPAMAX) 15 MG capsule Take 45 mg by mouth every evening.     Historical Provider, MD   Triage Vitals: BP 156/96 mmHg  Pulse 93  Temp(Src) 97.6 F (36.4 C) (Oral)  Resp 18  Ht 5\' 4"  (1.626 m)  Wt 203 lb (92.08 kg)  BMI 34.83 kg/m2  SpO2 97% Physical Exam  Constitutional: She is oriented to person, place, and time. She appears well-developed and well-nourished. No distress.  HENT:  Head: Normocephalic and atraumatic.  Active hemorrhage from left nares. Mild amount of blood to posterior oropharynx. No clots visualized.  Eyes: Conjunctivae and EOM are normal. Pupils are equal, round, and reactive to light. No scleral icterus.  Neck: Normal range of motion. Neck supple. No JVD present. No tracheal deviation present. No thyromegaly present.  Cardiovascular: Normal rate, regular rhythm and normal heart sounds.  Exam reveals no  gallop and no friction rub.   No murmur heard. Pulmonary/Chest: Effort normal and breath sounds normal. No respiratory distress. She has no wheezes. She exhibits no tenderness.  Abdominal: Soft. Bowel sounds are normal. She exhibits no distension and no mass. There is no tenderness. There is no rebound and no guarding.  Musculoskeletal: Normal range of motion. She exhibits no edema or tenderness.  Lymphadenopathy:    She has no cervical adenopathy.  Neurological: She is alert and oriented to person, place, and time.  Skin: Skin is warm and dry. No rash noted. No erythema. No pallor.  Nursing  note and vitals reviewed.   ED Course  Procedures (including critical care time) DIAGNOSTIC STUDIES: Oxygen Saturation is 97% on RA, normal by my interpretation.   COORDINATION OF CARE: 1:39 AM- Will apply silver nitrate to left nare and then recheck patient later. Pt verbalizes understanding and agrees to plan.  3:35 AM- Will pack left nare with rhino rocket. Will refer to ENT upon discharge.   Medications  oxymetazoline (AFRIN) 0.05 % nasal spray 1 spray (1 spray Each Nare Given 08/09/14 0216)    Labs Review Labs Reviewed  CBC WITH DIFFERENTIAL    Imaging Review No results found.   EKG Interpretation None      MDM   Final diagnoses:  None    Patient presents emergency department for nosebleed.  On exam I only see active hemorrhage from the left naris and mild posterior bleeding in the oropharynx. This is likely an anterior bleed although I cannot identify the source. She was given aspirin and silver nitrate sticks. She continued to bleed through this. Patient was then packed with a Rhino Rocket in the left Naris.  On my repeat evaluation approximately one hour later, patient states the bleeding has stopped. She no longer feels blood going down the back of her throat. At this time her vital signs remain within her normal limits and she is safe for discharge and to follow-up on  Monday.  Hemoglobin is 13.8  I personally performed the services described in this documentation, which was scribed in my presence. The recorded information has been reviewed and is accurate.    Debra Balls, MD 08/09/14 [phone]

## 2014-08-09 NOTE — ED Notes (Signed)
ENT cart at bedside

## 2014-08-09 NOTE — ED Notes (Signed)
Dr. Claudine Mouton is at the bedside with the patient.

## 2014-08-09 NOTE — Discharge Instructions (Signed)
Nosebleed Debra Jordan, you were seen today for a nosebleed. Your bleeding stopped after packing. Continue to take some Xarelto, do not remove the packing until you see ENT in 2 days. Take antibiotics as prescribed prevent any infection from the packing. If your symptoms worsen come back to emergency department immediately for repeat evaluation. Thank you. A nosebleed can be caused by many things, including:  Getting hit hard in the nose.  Infections.  Dry nose.  Colds.  Medicines. Your doctor may do lab testing if you get nosebleeds a lot and the cause is not known. HOME CARE   If your nose was packed with material, keep it there until your doctor takes it out. Put the pack back in your nose if the pack falls out.  Do not blow your nose for 12 hours after the nosebleed.  Sit up and bend forward if your nose starts bleeding again. Pinch the front half of your nose nonstop for 20 minutes.  Put petroleum jelly inside your nose every morning if you have a dry nose.  Use a humidifier to make the air less dry.  Do not take aspirin.  Try not to strain, lift, or bend at the waist for many days after the nosebleed. GET HELP RIGHT AWAY IF:   Nosebleeds keep happening and are hard to stop or control.  You have bleeding or bruises that are not normal on other parts of the body.  You have a fever.  The nosebleeds get worse.  You get lightheaded, feel faint, sweaty, or throw up (vomit) blood. MAKE SURE YOU:   Understand these instructions.  Will watch your condition.  Will get help right away if you are not doing well or get worse. Document Released: 05/17/2008 Document Revised: 10/31/2011 Document Reviewed: 05/17/2008 [facility] Patient Information 2015 [place], [place]. This information is not intended to replace advice given to you by your health care provider. Make sure you discuss any questions you have with your health care provider.

## 2014-08-09 NOTE — ED Notes (Signed)
Pt taking Zerelto for blood clots in feet. Tonight at 11pm she started having nose bleed. Denies pain or injury.

## 2015-12-02 ENCOUNTER — Other Ambulatory Visit (HOSPITAL_COMMUNITY): Payer: Self-pay | Admitting: Internal Medicine

## 2015-12-02 DIAGNOSIS — R1310 Dysphagia, unspecified: Secondary | ICD-10-CM

## 2015-12-11 ENCOUNTER — Other Ambulatory Visit (HOSPITAL_COMMUNITY): Payer: Self-pay

## 2015-12-11 ENCOUNTER — Ambulatory Visit (HOSPITAL_COMMUNITY): Payer: Self-pay

## 2015-12-11 ENCOUNTER — Ambulatory Visit (HOSPITAL_COMMUNITY)
Admission: RE | Admit: 2015-12-11 | Discharge: 2015-12-11 | Disposition: A | Discharge status: Home / Self Care | Payer: 59 | Source: Ambulatory Visit | Attending: Internal Medicine | Admitting: Internal Medicine

## 2015-12-11 DIAGNOSIS — N189 Chronic kidney disease, unspecified: Secondary | ICD-10-CM | POA: Insufficient documentation

## 2015-12-11 DIAGNOSIS — E785 Hyperlipidemia, unspecified: Secondary | ICD-10-CM | POA: Diagnosis not present

## 2015-12-11 DIAGNOSIS — M797 Fibromyalgia: Secondary | ICD-10-CM | POA: Diagnosis not present

## 2015-12-11 DIAGNOSIS — G40909 Epilepsy, unspecified, not intractable, without status epilepticus: Secondary | ICD-10-CM | POA: Insufficient documentation

## 2015-12-11 DIAGNOSIS — K224 Dyskinesia of esophagus: Secondary | ICD-10-CM | POA: Diagnosis present

## 2015-12-11 DIAGNOSIS — M109 Gout, unspecified: Secondary | ICD-10-CM | POA: Diagnosis not present

## 2015-12-11 DIAGNOSIS — R1310 Dysphagia, unspecified: Secondary | ICD-10-CM | POA: Diagnosis present

## 2015-12-11 DIAGNOSIS — I129 Hypertensive chronic kidney disease with stage 1 through stage 4 chronic kidney disease, or unspecified chronic kidney disease: Secondary | ICD-10-CM | POA: Insufficient documentation

## 2015-12-11 DIAGNOSIS — E119 Type 2 diabetes mellitus without complications: Secondary | ICD-10-CM | POA: Insufficient documentation

## 2015-12-11 DIAGNOSIS — E039 Hypothyroidism, unspecified: Secondary | ICD-10-CM | POA: Diagnosis not present

## 2015-12-11 NOTE — Progress Notes (Signed)
MBSS complete. Full report located under chart review in imaging section.  Recommendations: Continue regular diet/ thin liquids. Follow reflux precautions. May want to consider further esophageal evaluation.  Blair Heys, MA, CCC-SLP

## 2015-12-25 ENCOUNTER — Other Ambulatory Visit: Payer: Self-pay | Admitting: Gastroenterology

## 2015-12-25 DIAGNOSIS — R1310 Dysphagia, unspecified: Secondary | ICD-10-CM

## 2016-01-01 ENCOUNTER — Other Ambulatory Visit: Payer: 59

## 2016-01-07 ENCOUNTER — Other Ambulatory Visit: Payer: 59

## 2016-05-19 ENCOUNTER — Other Ambulatory Visit: Payer: Self-pay | Admitting: Internal Medicine

## 2016-05-19 DIAGNOSIS — R1032 Left lower quadrant pain: Secondary | ICD-10-CM

## 2016-05-24 ENCOUNTER — Other Ambulatory Visit: Payer: 59

## 2016-05-27 ENCOUNTER — Ambulatory Visit
Admission: RE | Admit: 2016-05-27 | Discharge: 2016-05-27 | Disposition: A | Discharge status: Home / Self Care | Payer: 59 | Source: Ambulatory Visit | Attending: Internal Medicine | Admitting: Internal Medicine

## 2016-05-27 DIAGNOSIS — R1032 Left lower quadrant pain: Secondary | ICD-10-CM

## 2018-02-14 ENCOUNTER — Other Ambulatory Visit: Payer: Self-pay | Admitting: Internal Medicine

## 2018-02-14 DIAGNOSIS — M545 Low back pain: Secondary | ICD-10-CM

## 2018-02-24 ENCOUNTER — Ambulatory Visit
Admission: RE | Admit: 2018-02-24 | Discharge: 2018-02-24 | Disposition: A | Discharge status: Home / Self Care | Payer: 59 | Source: Ambulatory Visit | Attending: Internal Medicine | Admitting: Internal Medicine

## 2018-02-24 ENCOUNTER — Other Ambulatory Visit: Payer: Self-pay | Admitting: Internal Medicine

## 2018-02-24 DIAGNOSIS — M545 Low back pain: Secondary | ICD-10-CM

## 2018-04-09 ENCOUNTER — Encounter: Payer: Self-pay | Admitting: *Deleted

## 2018-04-10 ENCOUNTER — Ambulatory Visit (INDEPENDENT_AMBULATORY_CARE_PROVIDER_SITE_OTHER): Payer: 59 | Admitting: Diagnostic Neuroimaging

## 2018-04-10 ENCOUNTER — Encounter: Payer: Self-pay | Admitting: Diagnostic Neuroimaging

## 2018-04-10 DIAGNOSIS — R413 Other amnesia: Secondary | ICD-10-CM
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
## 2018-04-11 ENCOUNTER — Telehealth: Payer: Self-pay | Admitting: Diagnostic Neuroimaging

## 2018-04-11 NOTE — Telephone Encounter (Signed)
Patient returned my call.Debra Jordan She would like it on a Saturday or Sunday I informed her I will send the order to GI and they will reach out to her.

## 2018-04-11 NOTE — Telephone Encounter (Signed)
lvm for pt to call back about scheduling mri  °[employer] Auth: NPR via [facility] website  °

## 2018-04-17 ENCOUNTER — Encounter: Payer: Self-pay | Admitting: Psychology

## 2018-04-28 ENCOUNTER — Ambulatory Visit
Admission: RE | Admit: 2018-04-28 | Discharge: 2018-04-28 | Disposition: A | Discharge status: Home / Self Care | Payer: 59 | Source: Ambulatory Visit | Attending: Diagnostic Neuroimaging | Admitting: Diagnostic Neuroimaging

## 2018-04-28 DIAGNOSIS — R413 Other amnesia: Secondary | ICD-10-CM | POA: Diagnosis not present

## 2018-05-07 ENCOUNTER — Telehealth: Payer: Self-pay | Admitting: Diagnostic Neuroimaging

## 2018-05-07 NOTE — Telephone Encounter (Signed)
Pt has called to inform that re: the Dr she has been referred to @ [facility] it will be 6 months before she can be seen there.  Pt is asking for a call to discuss

## 2018-05-08 NOTE — Telephone Encounter (Signed)
I have called patient and spoke to her relayed that Dr. Vikki Ports is booking out just as far in [facility]. I told patient Dr. Gildardo Pounds could see her maybe in 2 month's but she was in [place]. Patient was fine with this . Referral fax . Telephone -- [phone] - fax [phone] .

## 2018-05-08 NOTE — Telephone Encounter (Signed)
I would prefer patient wait for Dr. Si Raider (perhaps a cancellation list). Otherwise ok to to try other sites. -VRP

## 2018-05-09 NOTE — Telephone Encounter (Signed)
Noted  

## 2018-05-09 NOTE — Telephone Encounter (Signed)
I believe that there are some time limitations (due to she will be out of job in October), this is reason for other provider.

## 2018-05-16 NOTE — Telephone Encounter (Signed)
Patient has an apt with Dr.Catherine Clodfelter Nov. 11 th at 9:00 am. (Neuropsychological evaluation.) Patient is aware.  Dr. Barnetta Chapel Clodfelter [address] [place], [place] [address] Telephone [phone] -

## 2018-05-16 NOTE — Telephone Encounter (Signed)
Noted  

## 2018-05-28 ENCOUNTER — Telehealth: Payer: Self-pay | Admitting: Neurology

## 2018-05-28 NOTE — Telephone Encounter (Signed)
LVM requesting call back for MRI results. 

## 2018-05-28 NOTE — Telephone Encounter (Signed)
Called patient and informed her that Dr Krista Blue reviewed her MRI brain. Advised her there is mild progression of atrophy, small vessel disease, no acute findings. Advised she continue to take medications that manage her BP, diabetes and hyperlipidemia. She then stated she has lost her job and requested her work number be removed form her demographics. Demographics updated. She verbalized understanding, appreciation of call back.

## 2018-05-28 NOTE — Telephone Encounter (Signed)
Pt has returned the call to RN Stanton Kidney C, she is asking for a call back on her mobile

## 2018-05-28 NOTE — Telephone Encounter (Signed)
Please call patient, MRI of the brain showed generalized atrophy, supratentorium small vessel disease, there was no acute abnormality,   IMPRESSION:   MRI brain (without) demonstrating: - moderate perisylvian and mesial temporal atrophy; slightly progressed since 2007.  - mild scattered periventricular and subcortical foci of non-specific gliosis. - subtle intraluminal signal abnormality in the left vertebral artery, may represent atherosclerotic plaque, dissection or artifact. This is a new finding compared to 2007. - no acute findings.

## 2018-10-02 ENCOUNTER — Ambulatory Visit: Payer: Medicare Other | Admitting: Diagnostic Neuroimaging

## 2018-10-02 ENCOUNTER — Encounter: Payer: Self-pay | Admitting: Diagnostic Neuroimaging

## 2018-10-02 VITALS — BP 146/90 | HR 78 | Ht 64.0 in | Wt 240.2 lb

## 2018-10-02 DIAGNOSIS — R413 Other amnesia: Secondary | ICD-10-CM | POA: Diagnosis not present
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
## 2018-10-18 ENCOUNTER — Encounter

## 2018-10-18 ENCOUNTER — Encounter: Payer: 59 | Admitting: Psychology

## 2019-03-03 DIAGNOSIS — R5382 Chronic fatigue, unspecified: Secondary | ICD-10-CM

## 2019-03-03 DIAGNOSIS — Z8616 Personal history of COVID-19: Secondary | ICD-10-CM

## 2019-03-03 HISTORY — DX: Personal history of COVID-19: Z86.16

## 2019-03-03 HISTORY — DX: Chronic fatigue, unspecified: R53.82

## 2019-03-11 DIAGNOSIS — R2231 Localized swelling, mass and lump, right upper limb: Secondary | ICD-10-CM

## 2019-03-11 HISTORY — DX: Localized swelling, mass and lump, right upper limb: R22.31

## 2019-03-19 ENCOUNTER — Other Ambulatory Visit: Payer: Self-pay | Admitting: Orthopedic Surgery

## 2019-03-19 DIAGNOSIS — R2231 Localized swelling, mass and lump, right upper limb: Secondary | ICD-10-CM

## 2019-03-26 ENCOUNTER — Ambulatory Visit
Admission: RE | Admit: 2019-03-26 | Discharge: 2019-03-26 | Disposition: A | Discharge status: Home / Self Care | Payer: 59 | Source: Ambulatory Visit | Attending: Orthopedic Surgery | Admitting: Orthopedic Surgery

## 2019-03-26 DIAGNOSIS — R2231 Localized swelling, mass and lump, right upper limb: Secondary | ICD-10-CM

## 2019-04-11 ENCOUNTER — Other Ambulatory Visit: Payer: Self-pay | Admitting: Orthopedic Surgery

## 2019-05-06 ENCOUNTER — Other Ambulatory Visit (HOSPITAL_COMMUNITY): Payer: Medicare Other

## 2019-05-09 ENCOUNTER — Encounter (HOSPITAL_BASED_OUTPATIENT_CLINIC_OR_DEPARTMENT_OTHER): Payer: Self-pay

## 2019-05-09 ENCOUNTER — Ambulatory Visit (HOSPITAL_BASED_OUTPATIENT_CLINIC_OR_DEPARTMENT_OTHER): Admit: 2019-05-09 | Payer: Medicare Other | Admitting: Orthopedic Surgery

## 2019-05-09 SURGERY — EXCISION MASS
Anesthesia: Regional | Laterality: Right

## 2019-07-02 ENCOUNTER — Other Ambulatory Visit: Payer: Self-pay | Admitting: Internal Medicine

## 2019-07-02 DIAGNOSIS — Z1231 Encounter for screening mammogram for malignant neoplasm of breast: Secondary | ICD-10-CM

## 2019-07-16 ENCOUNTER — Other Ambulatory Visit: Payer: Self-pay | Admitting: Internal Medicine

## 2019-07-16 DIAGNOSIS — Z1231 Encounter for screening mammogram for malignant neoplasm of breast: Secondary | ICD-10-CM

## 2019-08-08 NOTE — Progress Notes (Signed)
Primary Physician/Referring:  Shon Baton, MD  Patient ID: Debra Jordan, female    DOB: [birth year], 68 y.o.   MRN: [id number]  Chief Complaint  Patient presents with  . Shortness of Breath  . Hypertension  . Leg Swelling   HPI:    HPI: Debra Jordan  is a 68 y.o. with type 2 diabetes with stage 3 CKD and h/o left nephrectomy for pelvic kidney in 1995,, hyperlipidemia, hypertension, hypothyroidism, history of migraines, history of gout,  Chronic back pain, history of recurrent embolic toe ischemia in 0000000 (left great toe and right 2nd toe) and has been on anticoagulation since, hypercoagulable work up negative, echo in 2013 with bubble study negative for shunt, referred to [place] on an urgent basis by Dr. Virgina Jock for shortness of breath and worsening lower extremity edema.  She is recently underwent chest x-ray in PCP office that was suspicious for pulmonary edema.  She has been on Lasix since then.  Has markedly decreased exercise tolerance, sedentary lifestyle and admits to binge eating especially in times of stress.She also states that her activity level is low due to chronic back pain.  Denies chest pain, hemoptysis.  Has chronic leg edema.  Past Medical History:  Diagnosis Date  . ADD (attention deficit disorder)   . Arthritis    gout  . Blue toe syndrome ([place]) 10/06/2011  . Broken shoulder    And tailbone   . Chronic kidney disease    hx of left nephrectomy  . Diabetes mellitus    type 2  . DM w/o complication type II, uncontrolled 09/06/2011  . Fatty liver   . Fibromyalgia   . Gout 09/06/2011  . History of headache   . History of kidney stones   . History of nephrectomy, unilateral 09/06/2011  . History of PCOS   . History of venous thrombosis and embolism    Left great toe  . Hyperlipidemia   . Hypersomnolence   . Hypertension   . Hypothyroid 09/06/2011  . Hypothyroidism   . Ischemia of extremity 09/06/2011  . Migraine headache 09/06/2011  . Neuromuscular disorder (HCC)      neuropathy to feet bilateral  . Obesity   . Seizure disorder ([place])   . Trouble swallowing    Past Surgical History:  Procedure Laterality Date  . ABDOMINAL HYSTERECTOMY    . HERNIA REPAIR    . LYMPH NODE BIOPSY    . NASAL SINUS SURGERY    . NEPHRECTOMY Left 2002   left nephrectomy  . TONSILLECTOMY  1969  . TUBAL LIGATION     Social History   Socioeconomic History  . Marital status: Widowed    Spouse name: Not on file  . Number of children: 2  . Years of education: college  . 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 Smoking status: Never Smoker  . Smokeless tobacco: Never Used  Substance and Sexual Activity  . Alcohol use: No  . Drug use: No  . Sexual activity: Never    Birth control/protection: Post-menopausal  Other Topics Concern  . Not on file  Social History Narrative   Caffeine- 4-6 cups per day    Lives at home with her adult daughter ( daughter has health problems)    Social Determinants of Health   Financial Resource Strain:   . Difficulty of Paying Living Expenses: Not on file  Food Insecurity:   . Worried  About Running Out of Food in the Last Year: Not on file  . Ran Out of Food in the Last Year: Not on file  Transportation Needs:   . Lack of Transportation (Medical): Not on file  . Lack of Transportation (Non-Medical): Not on file  Physical Activity:   . Days of Exercise per Week: Not on file  . Minutes of Exercise per Session: Not on file  Stress:   . Feeling of Stress : Not on file  Social Connections:   . Frequency of Communication with Friends and Family: Not on file  . Frequency of Social Gatherings with Friends and Family: Not on file  . Attends Religious Services: Not on file  . Active Member of Clubs or Organizations: Not on file  . Attends Archivist Meetings: Not on file  . Marital Status: Not on file  Intimate Partner Violence:   . Fear of Current or  Ex-Partner: Not on file  . Emotionally Abused: Not on file  . Physically Abused: Not on file  . Sexually Abused: Not on file   ROS  Review of Systems  Constitution: Positive for malaise/fatigue. Negative for chills, decreased appetite and weight gain.  Cardiovascular: Positive for dyspnea on exertion and leg swelling. Negative for syncope.  Endocrine: Negative for cold intolerance.  Hematologic/Lymphatic: Does not bruise/bleed easily.  Musculoskeletal: Positive for back pain. Negative for joint swelling.  Gastrointestinal: Negative for abdominal pain, anorexia, change in bowel habit, hematochezia and melena.  Neurological: Negative for headaches and light-headedness.  Psychiatric/Behavioral: Positive for depression and memory loss. Negative for substance abuse. The patient is nervous/anxious.   All other systems reviewed and are negative.  Objective  Blood pressure (!) 189/107, pulse 91, height 5\' 4"  (1.626 m), weight 248 lb 1.6 oz (112.5 kg), SpO2 95 %. Body mass index is 42.59 kg/m.   Physical Exam  Constitutional:  She is moderate to bed and morbidly obese in no acute distress.  HENT:  Head: Atraumatic.  Eyes: Conjunctivae are normal.  Neck: No JVD present. No thyromegaly present.  Cardiovascular: Normal rate, regular rhythm, normal heart sounds and intact distal pulses. Exam reveals no gallop.  No murmur heard. 2+ bilateral pitting leg edema. No JVD.  Pulmonary/Chest: Effort normal and breath sounds normal.  Abdominal: Soft. Bowel sounds are normal.  Musculoskeletal:        General: Normal range of motion.     Cervical back: Neck supple.  Neurological: She is alert.  Skin: Skin is warm and dry.  Psychiatric: She has a normal mood and affect.   Radiology: No results found.  Laboratory examination:   No results for input(s): NA, K, CL, CO2, GLUCOSE, BUN, CREATININE, CALCIUM, GFRNONAA, GFRAA in the last 8760 hours. CMP Latest Ref Rng & Units 09/05/2011  Glucose 70 - 99  mg/dL 170(H)  BUN 6 - 23 mg/dL 17  Creatinine 0.50 - 1.10 mg/dL 1.34(H)  Sodium 135 - 145 mEq/L 141  Potassium 3.5 - 5.1 mEq/L 3.5  Chloride 96 - 112 mEq/L 107  CO2 19 - 32 mEq/L 22  Calcium 8.4 - 10.5 mg/dL 9.9   CBC Latest Ref Rng & Units 08/09/2014 09/08/2011 09/07/2011  WBC 4.0 - 10.5 K/uL 7.7 7.6 10.1  Hemoglobin 12.0 - 15.0 g/dL 13.8 12.3 13.8  Hematocrit 36.0 - 46.0 % 40.9 37.1 40.7  Platelets 150 - 400 K/uL 185 160 201   Lipid Panel     Component Value Date/Time   CHOL 230 (H) 09/07/2011 0519  TRIG 401 (H) 09/07/2011 0519   HDL 39 (L) 09/07/2011 0519   CHOLHDL 5.9 09/07/2011 0519   VLDL UNABLE TO CALCULATE IF TRIGLYCERIDE OVER 400 mg/dL 09/07/2011 0519   LDLCALC UNABLE TO CALCULATE IF TRIGLYCERIDE OVER 400 mg/dL 09/07/2011 0519   HEMOGLOBIN A1C Lab Results  Component Value Date   HGBA1C 8.5 (H) 09/06/2011   MPG 197 (H) 09/06/2011   TSH No results for input(s): TSH in the last 8760 hours. Medications   Current Outpatient Medications  Medication Instructions  . Armodafinil 150 mg, Oral, 1/2 tablet prn   . Cholecalciferol (VITAMIN D3 PO) Oral, Daily  . Colcrys 0.6 mg, As needed  . cyclobenzaprine (FLEXERIL) 10 mg, Daily at bedtime  . DULoxetine (CYMBALTA) 60 mg, Oral, Daily  . fenofibrate 160 mg, Oral, Daily  . furosemide (LASIX) 20 mg, Oral  . hydrALAZINE (APRESOLINE) 25 mg, Oral, 3 times daily  . insulin glargine (LANTUS) 100 UNIT/ML injection Subcutaneous, 2 times daily, 37 units   . insulin lispro (HUMALOG) 3-15 Units, Subcutaneous, 4 times daily, Sliding scale  . isosorbide dinitrate (ISORDIL) 30 mg, Oral, 3 times daily  . labetalol (NORMODYNE) 200 mg, Oral, 3 times daily  . Levothyroxine Sodium 100 mcg, Oral, BH-each morning  . losartan (COZAAR) 50 mg, Oral, 2 times daily  . MELATONIN PO Oral, Daily at bedtime  . Multiple Vitamin (MULTIVITAMIN) tablet 1 tablet, Oral, Daily  . pantoprazole (PROTONIX) 40 mg, Oral, Daily  . rivaroxaban (XARELTO) 2.5 mg,  Oral, As needed, XARLETO  . terbinafine (LAMISIL) 250 mg, Oral, Daily    Cardiac Studies:   MRA of the abdomen and bilateral lower extremity for 08/11/11: Normal lower extremity MRA, single right kidney, no evidence of renal artery stenosis.  No significant disease of the aorta.  Echocardiogram 2013:Bubble  study negative for to cardiac shunting, normal LV systolic function, grade 1 diastolic dysfunction.  Assessment     ICD-10-CM   1. SOB (shortness of breath)  R06.02 EKG 12-Lead    PCV ECHOCARDIOGRAM COMPLETE    PCV MYOCARDIAL PERFUSION WITH LEXISCAN  2. Chronic diastolic (congestive) heart failure (HCC)  I50.32 PCV ECHOCARDIOGRAM COMPLETE    PCV MYOCARDIAL PERFUSION WITH LEXISCAN  3. Bilateral leg edema  R60.0   4. Mixed hyperlipidemia  E78.2   5. Resistant hypertension  I10 labetalol (NORMODYNE) 200 MG tablet    isosorbide dinitrate (ISORDIL) 30 MG tablet    hydrALAZINE (APRESOLINE) 25 MG tablet  6. Single kidney: Left nephrectomy (Pelvic)  Z90.5   7. Uncontrolled secondary diabetes mellitus with stage 3 CKD (GFR 30-59) (HCC)  E13.22    E13.65    N18.30     EKG 08/09/2019: Normal sinus rhythm at rate of 96 bpm, left axis deviation, left anterior fascicular block.  LVH.  No evidence of ischemia.  Recommendations:   Debra Jordan  is a 68 y.o.  with type 2 diabetes with stage 3 CKD and h/o left nephrectomy for pelvic kidney in 1995,, hyperlipidemia, hypertension, hypothyroidism, history of migraines, depression and anxiety, mild cognitive impairment, history of gout,  Chronic back pain, history of recurrent embolic toe ischemia in 0000000 (left great toe and right 2nd toe) and has been on anticoagulation since, hypercoagulable work up negative, echo in 2013 with bubble study negative for shunt, referred to [place] on an urgent basis by Dr. Virgina Jock for shortness of breath and worsening lower extremity edema.  Extremely complex individual with lack of insight with regard to diabetes control,  uncontrolled hypertension going on for  several years, symptoms of chronic diastolic heart failure.  Without diet modification and lifestyle modification, it'll be extremely difficult to control her dyspnea and also leg edema and also hypertension.  This was discussed in detail with the patient. Weight loss stressed again and gave positive reinforcement. [facility] diet (low glycemic) sheet given to the patient.  Discussed regarding DASH diet and salt restriction. She admits to eating wrong food including salty food without knowing about it.   I have started her on labetalol 200 mg p.o. t.i.d. and discontinued carvedilol.  Also added isosorbide dinitrate 30 mg and hydralazine 25 mg t.i.d. Schedule for a Lexiscan Sestamibi stress test to evaluate for myocardial ischemia. Patient unable to do treadmill stress testing due to dyspnea. Will schedule for an echocardiogram. OV in 6 weeks.  "Total time spent with patient was 60 minutes and greater than 50% of that time was spent in counseling and coordination care with the patient and/or family regarding her past history, complex medical condition and evaluation of her symptoms"     Adrian Prows, MD, [facility] 08/10/2019, 7:06 AM [facility] [place] Pager: [phone] Office: [phone] If no answer Cell [phone]  CC: Donato Heinz, MD (Nephro); Shon Baton, [facility]

## 2019-08-09 ENCOUNTER — Encounter: Payer: Self-pay | Admitting: Cardiology

## 2019-08-09 ENCOUNTER — Other Ambulatory Visit: Payer: Self-pay

## 2019-08-09 ENCOUNTER — Ambulatory Visit (INDEPENDENT_AMBULATORY_CARE_PROVIDER_SITE_OTHER): Payer: Medicare Other | Admitting: Cardiology

## 2019-08-09 VITALS — BP 189/107 | HR 91 | Ht 64.0 in | Wt 248.1 lb

## 2019-08-09 DIAGNOSIS — I10 Essential (primary) hypertension: Secondary | ICD-10-CM

## 2019-08-09 DIAGNOSIS — R0602 Shortness of breath: Secondary | ICD-10-CM | POA: Diagnosis not present

## 2019-08-09 DIAGNOSIS — E782 Mixed hyperlipidemia: Secondary | ICD-10-CM

## 2019-08-09 DIAGNOSIS — E1365 Other specified diabetes mellitus with hyperglycemia: Secondary | ICD-10-CM

## 2019-08-09 DIAGNOSIS — IMO0002 Reserved for concepts with insufficient information to code with codable children: Secondary | ICD-10-CM

## 2019-08-09 DIAGNOSIS — N183 Chronic kidney disease, stage 3 unspecified: Secondary | ICD-10-CM

## 2019-08-09 DIAGNOSIS — I5032 Chronic diastolic (congestive) heart failure: Secondary | ICD-10-CM

## 2019-08-09 DIAGNOSIS — R600 Localized edema: Secondary | ICD-10-CM

## 2019-08-09 DIAGNOSIS — I1A0 Resistant hypertension: Secondary | ICD-10-CM

## 2019-08-09 DIAGNOSIS — E1322 Other specified diabetes mellitus with diabetic chronic kidney disease: Secondary | ICD-10-CM

## 2019-08-09 DIAGNOSIS — Z905 Acquired absence of kidney: Secondary | ICD-10-CM

## 2019-08-09 MED ORDER — ISOSORBIDE DINITRATE 30 MG PO TABS: 30.0000 mg | ORAL_TABLET | Freq: Three times a day (TID) | ORAL | 2 refills | Status: DC

## 2019-08-09 MED ORDER — LABETALOL HCL 200 MG PO TABS: 200.0000 mg | ORAL_TABLET | Freq: Three times a day (TID) | ORAL | 2 refills | Status: DC

## 2019-08-09 MED ORDER — HYDRALAZINE HCL 25 MG PO TABS: 25.0000 mg | ORAL_TABLET | Freq: Three times a day (TID) | ORAL | 2 refills | Status: DC

## 2019-08-09 NOTE — Patient Instructions (Addendum)
Stop eating pickles You will not eat any nuts due to high calories -will not eat any cheese - No grapes, no watermelon, no banana that his right.  Follow [facility] Diet sheet recommendation  Metamucil 1 tablespoonful every night before bedtime for constipation

## 2019-08-20 ENCOUNTER — Telehealth: Payer: Self-pay

## 2019-08-20 NOTE — Telephone Encounter (Signed)
Patient is aware 

## 2019-08-20 NOTE — Telephone Encounter (Signed)
Patient called stating that she was prescribed a new blood pressure medication. She stated that after taking her HCTZ, she was having severe fatigue, lightheadedness, and pain all over her body to the point where she had to call EMS. The doctor on-call, Dr. Keane Police office, advised her to stop the HCTZ immediately.

## 2019-08-20 NOTE — Telephone Encounter (Signed)
Will continue to monitor.

## 2019-08-26 ENCOUNTER — Other Ambulatory Visit: Payer: Self-pay

## 2019-08-26 ENCOUNTER — Ambulatory Visit: Payer: Medicare Other

## 2019-09-02 ENCOUNTER — Other Ambulatory Visit: Payer: Self-pay

## 2019-09-02 ENCOUNTER — Ambulatory Visit (INDEPENDENT_AMBULATORY_CARE_PROVIDER_SITE_OTHER): Payer: Medicare Other

## 2019-09-02 DIAGNOSIS — I5032 Chronic diastolic (congestive) heart failure: Secondary | ICD-10-CM | POA: Diagnosis not present

## 2019-09-02 DIAGNOSIS — R0602 Shortness of breath: Secondary | ICD-10-CM | POA: Diagnosis not present

## 2019-09-05 ENCOUNTER — Telehealth: Payer: Self-pay

## 2019-09-05 NOTE — Telephone Encounter (Signed)
Discussed results with patient. She verbalized understanding. 

## 2019-09-05 NOTE — Telephone Encounter (Signed)
-----   Message from Adrian Prows, MD sent at 09/05/2019  6:34 AM EST ----- LV looks mildly enlarged, no ischemia and may be related to high BP. Will wait on echo and discuss more on OV

## 2019-09-06 ENCOUNTER — Other Ambulatory Visit: Payer: Self-pay

## 2019-09-06 ENCOUNTER — Ambulatory Visit (INDEPENDENT_AMBULATORY_CARE_PROVIDER_SITE_OTHER): Payer: Medicare Other

## 2019-09-06 DIAGNOSIS — R0602 Shortness of breath: Secondary | ICD-10-CM

## 2019-09-06 DIAGNOSIS — I5032 Chronic diastolic (congestive) heart failure: Secondary | ICD-10-CM

## 2019-09-10 NOTE — Progress Notes (Signed)
Patient aware.

## 2019-09-20 ENCOUNTER — Ambulatory Visit: Payer: Medicare Other | Admitting: Cardiology

## 2019-09-20 ENCOUNTER — Encounter: Payer: Self-pay | Admitting: Cardiology

## 2019-09-20 ENCOUNTER — Other Ambulatory Visit: Payer: Self-pay

## 2019-09-20 VITALS — BP 128/74 | HR 69 | Temp 97.0°F | Ht 64.5 in | Wt 247.5 lb

## 2019-09-20 DIAGNOSIS — I10 Essential (primary) hypertension: Secondary | ICD-10-CM | POA: Diagnosis not present

## 2019-09-20 DIAGNOSIS — Z905 Acquired absence of kidney: Secondary | ICD-10-CM | POA: Diagnosis not present

## 2019-09-20 DIAGNOSIS — I1A0 Resistant hypertension: Secondary | ICD-10-CM

## 2019-09-20 DIAGNOSIS — E782 Mixed hyperlipidemia: Secondary | ICD-10-CM

## 2019-09-20 DIAGNOSIS — I5032 Chronic diastolic (congestive) heart failure: Secondary | ICD-10-CM

## 2019-09-20 DIAGNOSIS — E559 Vitamin D deficiency, unspecified: Secondary | ICD-10-CM

## 2019-09-20 MED ORDER — HYDRALAZINE HCL 25 MG PO TABS: 25.0000 mg | ORAL_TABLET | Freq: Three times a day (TID) | ORAL | 2 refills | Status: DC

## 2019-09-20 MED ORDER — ATORVASTATIN CALCIUM 10 MG PO TABS: 10.0000 mg | ORAL_TABLET | Freq: Every day | ORAL | 2 refills | Status: DC

## 2019-09-20 NOTE — Progress Notes (Signed)
Primary Physician/Referring:  Shon Baton, MD  Patient ID: Debra Jordan, female    DOB: [birth year], 69 y.o.   MRN: [id number]  Chief Complaint  Patient presents with  . Hypertension  . Shortness of Breath  . Follow-up    6 weeks   HPI:    HPI: Debra Jordan  is a 69 y.o. with type 2 diabetes with stage 3 CKD and h/o left nephrectomy for pelvic kidney in 1995,, hyperlipidemia, hypertension, hypothyroidism, history of migraines, history of gout,  Chronic back pain, history of recurrent embolic toe ischemia in 0000000 (left great toe and right 2nd toe) and has been on anticoagulation since, hypercoagulable work up negative, echo in 2013 with bubble study negative for shunt.   She was seen by me on 08/09/2019 for difficult to control hypertension, shortness of breath and leg edema.  I had started her on labetalol 200 mg p.o. 3 times daily and Imdur and hydralazine combination and obtain echocardiogram and stress test, now presents for follow-up.  Since last office visit she has noticed marked improvement in leg edema and also dyspnea and blood pressure control.  In fact 2 days after starting the medication she had low blood pressure and dizziness and hydralazine was stopped after the EMS arrived at home.  Presently continues to notice heart pressure has been elevated and is concerned about this although blood pressure today was normal in our office.  Past Medical History:  Diagnosis Date  . ADD (attention deficit disorder)   . Arthritis    gout  . Blue toe syndrome ([place]) 10/06/2011  . Broken shoulder    And tailbone   . Chronic kidney disease    hx of left nephrectomy  . Diabetes mellitus    type 2  . DM w/o complication type II, uncontrolled 09/06/2011  . Fatty liver   . Fibromyalgia   . Gout 09/06/2011  . History of headache   . History of kidney stones   . History of nephrectomy, unilateral 09/06/2011  . History of PCOS   . History of venous thrombosis and embolism    Left great  toe  . Hyperlipidemia   . Hypersomnolence   . Hypertension   . Hypothyroid 09/06/2011  . Hypothyroidism   . Ischemia of extremity 09/06/2011  . Migraine headache 09/06/2011  . Neuromuscular disorder (HCC)    neuropathy to feet bilateral  . Obesity   . Seizure disorder ([place])   . Trouble swallowing    Past Surgical History:  Procedure Laterality Date  . ABDOMINAL HYSTERECTOMY    . HERNIA REPAIR    . LYMPH NODE BIOPSY    . NASAL SINUS SURGERY    . NEPHRECTOMY Left 2002   left nephrectomy  . TONSILLECTOMY  1969  . TUBAL LIGATION     Social History   Socioeconomic History  . Marital status: Widowed    Spouse name: Not on file  . Number of children: 2  . Years of education: college  . 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 Smoking status: Never Smoker  . Smokeless tobacco: Never Used  Substance and Sexual Activity  . Alcohol use: No  . Drug use: No  . Sexual activity: Never    Birth control/protection: Post-menopausal  Other Topics Concern  . Not on file  Social History Narrative   Caffeine- 4-6 cups per day    Lives at home with her  adult daughter ( daughter has health problems)    Social Determinants of Health   Financial Resource Strain:   . Difficulty of Paying Living Expenses: Not on file  Food Insecurity:   . Worried About Charity fundraiser in the Last Year: Not on file  . Ran Out of Food in the Last Year: Not on file  Transportation Needs:   . Lack of Transportation (Medical): Not on file  . Lack of Transportation (Non-Medical): Not on file  Physical Activity:   . Days of Exercise per Week: Not on file  . Minutes of Exercise per Session: Not on file  Stress:   . Feeling of Stress : Not on file  Social Connections:   . Frequency of Communication with Friends and Family: Not on file  . Frequency of Social Gatherings with Friends and Family: Not on file  . Attends Religious  Services: Not on file  . Active Member of Clubs or Organizations: Not on file  . Attends Archivist Meetings: Not on file  . Marital Status: Not on file  Intimate Partner Violence:   . Fear of Current or Ex-Partner: Not on file  . Emotionally Abused: Not on file  . Physically Abused: Not on file  . Sexually Abused: Not on file   ROS  Review of Systems  Constitution: Positive for malaise/fatigue. Negative for weight gain.  Cardiovascular: Positive for dyspnea on exertion. Negative for leg swelling and syncope.  Respiratory: Negative for hemoptysis.   Endocrine: Negative for cold intolerance.  Hematologic/Lymphatic: Does not bruise/bleed easily.  Musculoskeletal: Positive for arthritis.  Gastrointestinal: Negative for hematochezia and melena.  Neurological: Negative for headaches and light-headedness.  Psychiatric/Behavioral: Positive for depression and memory loss.   Objective  Blood pressure 128/74, pulse 69, temperature (!) 97 F (36.1 C), height 5' 4.5" (1.638 m), weight 247 lb 8 oz (112.3 kg), SpO2 96 %. Body mass index is 41.83 kg/m.   Vitals with BMI 09/20/2019 08/09/2019 10/02/2018  Height 5' 4.5" 5\' 4"  5\' 4"   Weight 247 lbs 8 oz 248 lbs 2 oz 240 lbs 3 oz  BMI 41.84 0000000 [id number]  Systolic 0000000 [id number]  Diastolic 74 [id number] 90  Pulse 69 91 78    Physical Exam  Constitutional:  She is moderately built and morbidly obese in no acute distress.  HENT:  Head: Atraumatic.  Eyes: Conjunctivae are normal.  Neck: No thyromegaly present.  Cardiovascular: Normal rate, regular rhythm, normal heart sounds and intact distal pulses. Exam reveals no gallop.  No murmur heard. 2+ bilateral pitting leg edema. No JVD.  Pulmonary/Chest: Effort normal and breath sounds normal.  Abdominal: Soft. Bowel sounds are normal.  Musculoskeletal:     Cervical back: Neck supple.  Neurological: She is alert.   Radiology: No results found.  Laboratory examination:   No results for  input(s): NA, K, CL, CO2, GLUCOSE, BUN, CREATININE, CALCIUM, GFRNONAA, GFRAA in the last 8760 hours. CMP Latest Ref Rng & Units 09/05/2011  Glucose 70 - 99 mg/dL 170(H)  BUN 6 - 23 mg/dL 17  Creatinine 0.50 - 1.10 mg/dL 1.34(H)  Sodium 135 - 145 mEq/L 141  Potassium 3.5 - 5.1 mEq/L 3.5  Chloride 96 - 112 mEq/L 107  CO2 19 - 32 mEq/L 22  Calcium 8.4 - 10.5 mg/dL 9.9   CBC Latest Ref Rng & Units 08/09/2014 09/08/2011 09/07/2011  WBC 4.0 - 10.5 K/uL 7.7 7.6 10.1  Hemoglobin 12.0 - 15.0 g/dL 13.8 12.3 13.8  Hematocrit 36.0 -  46.0 % 40.9 37.1 40.7  Platelets 150 - 400 K/uL 185 160 201   Lipid Panel     Component Value Date/Time   CHOL 230 (H) 09/07/2011 0519   TRIG 401 (H) 09/07/2011 0519   HDL 39 (L) 09/07/2011 0519   CHOLHDL 5.9 09/07/2011 0519   VLDL UNABLE TO CALCULATE IF TRIGLYCERIDE OVER 400 mg/dL 09/07/2011 0519   LDLCALC UNABLE TO CALCULATE IF TRIGLYCERIDE OVER 400 mg/dL 09/07/2011 0519   HEMOGLOBIN A1C Lab Results  Component Value Date   HGBA1C 8.5 (H) 09/06/2011   MPG 197 (H) 09/06/2011   TSH No results for input(s): TSH in the last 8760 hours.   External labs  Cholesterol, total 221.000 m 05/13/2019 HDL 38 MG/DL 05/13/2019 LDL 140.000 m 05/13/2019 Triglycerides 213.000 05/13/2019  A1C 7.900 % 08/01/2019. TSH 3.620 08/05/2019  Hemoglobin 13.100 g/ 08/05/2019 Platelets 215.000 X 11/07/2018  Creatinine, Serum 1.500 mg/ 08/05/2019 Potassium 4.500 MM 11/06/2015 ALT (SGPT) 32.000 uni 05/13/2019   Medications   Current Outpatient Medications  Medication Instructions  . Armodafinil 150 mg, Oral, As directed, 1/2 tablet prn  . Cholecalciferol (VITAMIN D3 PO) Oral, Daily  . cyclobenzaprine (FLEXERIL) 10 mg, Daily at bedtime  . DULoxetine (CYMBALTA) 60 mg, Oral, Daily  . fenofibrate 160 mg, Oral, Daily  . furosemide (LASIX) 20 mg, Oral  . hydrALAZINE (APRESOLINE) 25 mg, Oral, 3 times daily  . insulin glargine (LANTUS) 100 UNIT/ML injection Subcutaneous, 2 times daily,  37 units   . insulin lispro (HUMALOG) 3-15 Units, Subcutaneous, 4 times daily, Sliding scale  . isosorbide dinitrate (ISORDIL) 30 mg, Oral, 3 times daily  . labetalol (NORMODYNE) 200 mg, Oral, 3 times daily  . Levothyroxine Sodium 100 mcg, Oral, BH-each morning  . losartan (COZAAR) 50 mg, Oral, 2 times daily  . Multiple Vitamin (MULTIVITAMIN) tablet 1 tablet, Oral, Daily  . pantoprazole (PROTONIX) 40 mg, Oral, Daily  . rivaroxaban (XARELTO) 2.5 mg, Oral, As needed, XARLETO    Cardiac Studies:   MRA of the abdomen and bilateral lower extremity for 08/11/11: Normal lower extremity MRA, single right kidney, no evidence of renal artery stenosis.  No significant disease of the aorta.  Echocardiogram 2013:Bubble  study negative for to cardiac shunting, normal LV systolic function, grade 1 diastolic dysfunction.  Echocardiogram 09/06/2019:  Left ventricle cavity is normal in size. Moderate concentric hypertrophy of the left ventricle. Normal global wall motion. Doppler evidence of grade II (pseudonormal) diastolic dysfunction, elevated LAP. Normal LV systolic function with visual EF 50-55%. Calculated EF 50%. IVC is not well visualized.  Lexiscan (Walking with mod Bruce)Tetrofosmin Stress Test  09/02/2019: Nondiagnostic ECG stress. The heart rate response was accelerated. The baseline blood pressure was 200/116 mmHg and increased to 230/120 mmHg at peak infusion.  Resting EKG/ECG demonstrated normal sinus rhythm. Peak EKG/ECG revealed non-specific ST-T abnormality. The LV is mildly dilated both in rest and stress images.  Normal myocardial perfusion. Mild global hypokinesis. Stress LV EF is mildly dysfunctional 42%.  Findings consistent with non-ischemic cardiomyopathy.  No previous exam available for comparison. Intermediate risk study.  Assessment     ICD-10-CM   1. Resistant hypertension  I10 hydrALAZINE (APRESOLINE) 25 MG tablet  2. Single kidney: Left nephrectomy (Pelvic)  Z90.5   3.  Chronic diastolic (congestive) heart failure (HCC)  I50.32     EKG 08/09/2019: Normal sinus rhythm at rate of 96 bpm, left axis deviation, left anterior fascicular block.  LVH.  No evidence of ischemia.  Recommendations:   Debra Jordan  is a 69 y.o.  with type 2 diabetes with stage 3 CKD and h/o left nephrectomy for pelvic kidney in 1995,, hyperlipidemia, hypertension, hypothyroidism, history of migraines, depression and anxiety, mild cognitive impairment, history of gout,  chronic back pain, history of recurrent embolic toe ischemia in 0000000 (left great toe and right 2nd toe) and has been on anticoagulation since, hypercoagulable work up negative, echo in 2013 with bubble study negative for shunt.  She was seen by me on 08/09/2019 for difficult to control hypertension, shortness of breath and leg edema.  I had started her on labetalol 200 mg p.o. 3 times daily and Imdur and hydralazine combination.   Hydralazine was discontinued after she had a hypotensive episode and EMS came to her house 2 weeks ago.  She had taken 2 doses total.  She is concerned about continued elevated blood pressure, but much improved from previous 200/110 mmHg to the present.  She also states that she recently had BMP done by her nephrologist and serum creatinine has improved.  She feels well overall, and findings of stress test and echocardiogram are suggestive of nonischemic cardiomyopathy probably related to hypertension and hypertensive heart disease.  Advised her to restart hydralazine and she will continue to monitor her blood pressure at home and she can certainly hold this if blood pressure drops below 130 mmHg.  Again extensive discussion held regarding making lifestyle changes and continued weight loss.  She is already made significant amount changes.  I will see her back in 6 weeks for follow-up.  We discussed regarding hyperlipidemia, she had tried statins remotely and is willing to try the statins again.  Vitamin D  deficiency may have led to statin intolerance in the past.  She is presently on vitamin D supplements as well.  I will obtain a vitamin D level along with lipids in 6 weeks and see her back then.  This was a 40-minute encounter to discuss complex medical issues, in addition to making regarding medications and dietary counseling.  Adrian Prows, MD, [facility] 09/20/2019, 11:40 AM [facility] [place] Pager: [phone]    CC: Donato Heinz, MD (Nephro); Shon Baton, [facility]

## 2019-10-28 ENCOUNTER — Encounter (HOSPITAL_COMMUNITY): Payer: Medicare Other

## 2019-10-29 ENCOUNTER — Other Ambulatory Visit (HOSPITAL_COMMUNITY): Payer: Self-pay | Admitting: Internal Medicine

## 2019-10-29 ENCOUNTER — Other Ambulatory Visit: Payer: Self-pay

## 2019-10-29 ENCOUNTER — Ambulatory Visit (HOSPITAL_COMMUNITY)
Admission: RE | Admit: 2019-10-29 | Discharge: 2019-10-29 | Disposition: A | Discharge status: Home / Self Care | Payer: Medicare Other | Source: Ambulatory Visit | Attending: Vascular Surgery | Admitting: Vascular Surgery

## 2019-10-29 DIAGNOSIS — R600 Localized edema: Secondary | ICD-10-CM

## 2019-11-14 ENCOUNTER — Telehealth: Payer: Self-pay | Admitting: Diagnostic Neuroimaging

## 2019-11-14 NOTE — Telephone Encounter (Signed)
Caryl Pina w/PCP called back. She stated the patient saw NP in their office earlier this month. She presented with same symptoms, complaints and had a full work up. I requested Caryl Pina fax notes and lab results for Dr Penumalli's review.
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
## 2019-11-14 NOTE — Telephone Encounter (Addendum)
Called patient who stated she thinks she had Covid July 2020 but was never tested. She reports muscle aches and pain, chronic fatigue and memory issues, shaking all of which are getting worse. I advised she may need to be tested for underlying infections, low Vit B12, Covid antibodies.  She stated she called PCP's RN, Ashely and was told to call this office. I advised her I'll call RN, also suggested she call cardiologist since she has heart conditions. She agreed, verbalized understanding, appreciation. Called Ashley, LVM requesting call back to discuss patient's reported conditions.

## 2019-11-18 NOTE — Telephone Encounter (Signed)
Received PCP notes as requested. On Dr [employer] desk for review. She has FU end of April. week.

## 2019-11-27 ENCOUNTER — Ambulatory Visit: Payer: Medicare Other | Admitting: Cardiology

## 2019-11-29 ENCOUNTER — Ambulatory Visit: Payer: Medicare Other | Admitting: Cardiology

## 2019-11-29 ENCOUNTER — Encounter: Payer: Self-pay | Admitting: Cardiology

## 2019-11-29 ENCOUNTER — Other Ambulatory Visit: Payer: Self-pay

## 2019-11-29 VITALS — BP 138/67 | HR 75 | Temp 98.3°F | Resp 15 | Ht 64.0 in | Wt 244.0 lb

## 2019-11-29 DIAGNOSIS — E1159 Type 2 diabetes mellitus with other circulatory complications: Secondary | ICD-10-CM

## 2019-11-29 DIAGNOSIS — R600 Localized edema: Secondary | ICD-10-CM

## 2019-11-29 DIAGNOSIS — I1A0 Resistant hypertension: Secondary | ICD-10-CM

## 2019-11-29 DIAGNOSIS — Z905 Acquired absence of kidney: Secondary | ICD-10-CM

## 2019-11-29 DIAGNOSIS — E782 Mixed hyperlipidemia: Secondary | ICD-10-CM

## 2019-11-29 DIAGNOSIS — Z6841 Body Mass Index (BMI) 40.0 and over, adult: Secondary | ICD-10-CM

## 2019-11-29 DIAGNOSIS — I10 Essential (primary) hypertension: Secondary | ICD-10-CM

## 2019-11-29 DIAGNOSIS — Z794 Long term (current) use of insulin: Secondary | ICD-10-CM
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
## 2019-12-10 ENCOUNTER — Encounter (INDEPENDENT_AMBULATORY_CARE_PROVIDER_SITE_OTHER): Payer: Self-pay | Admitting: Ophthalmology

## 2019-12-10 ENCOUNTER — Ambulatory Visit (INDEPENDENT_AMBULATORY_CARE_PROVIDER_SITE_OTHER): Payer: Medicare Other | Admitting: Ophthalmology

## 2019-12-10 ENCOUNTER — Other Ambulatory Visit: Payer: Self-pay

## 2019-12-10 DIAGNOSIS — H25813 Combined forms of age-related cataract, bilateral: Secondary | ICD-10-CM

## 2019-12-10 DIAGNOSIS — E113391 Type 2 diabetes mellitus with moderate nonproliferative diabetic retinopathy without macular edema, right eye: Secondary | ICD-10-CM | POA: Diagnosis not present

## 2019-12-10 DIAGNOSIS — E113312 Type 2 diabetes mellitus with moderate nonproliferative diabetic retinopathy with macular edema, left eye: Secondary | ICD-10-CM

## 2019-12-10 DIAGNOSIS — H35033 Hypertensive retinopathy, bilateral: Secondary | ICD-10-CM

## 2019-12-10 DIAGNOSIS — H3581 Retinal edema: Secondary | ICD-10-CM | POA: Diagnosis not present

## 2019-12-10 DIAGNOSIS — I10 Essential (primary) hypertension: Secondary | ICD-10-CM | POA: Diagnosis not present

## 2019-12-10 NOTE — Progress Notes (Signed)
[facility] Note  12/10/2019     CHIEF COMPLAINT Patient presents for Diabetic Eye Exam   HISTORY OF PRESENT ILLNESS: Debra GWILLIAM is a 69 y.o. female who presents to the clinic today for:   HPI    Diabetic Eye Exam    Vision is blurred for near and fluctuates with blood sugars.  Associated Symptoms Floaters.  Negative for Flashes, Blind Spot, Photophobia, Scalp Tenderness, Fever, Pain, Glare, Jaw Claudication, Weight Loss, Distortion, Redness, Trauma, Shoulder/Hip pain and Fatigue.  Diabetes characteristics include Type 2 and on insulin.  This started 1 year ago.  Last Blood Glucose 110 (Now, @ 01:05pm).  Last A1C 6.6 (1-2 months ago).  Associated Diagnosis Kidney Disease and Neuropathy.  I, the attending physician,  performed the HPI with the patient and updated documentation appropriately.          Comments    Patient states vision blurred at distance and near for the past year. Patient is IDDM for the past 15 years. Vision fluctuates at times. Some floaters OU--not new.        Last edited by Bernarda Caffey, MD on 12/10/2019  1:32 PM. (History)    pt is here on the referral of Dr. Parke Simmers for concern of diabetic retinopathy, she states this was her first appt with him, pt used to see Dr. Lynnda Shields at [facility] and then saw Dr. Ellie Lunch who told her she needs a diabetic specialist, pt states Dr. Oval Linsey never did any laser procedures or injections, pt states her last A1c was 6.6, down from 8.0, she states she does not check her BP "very often", but the drs have given her a "ton" of BP meds, so they are happy with where it's at right now  Referring physician: [address], [place],  [place] [address]  HISTORICAL INFORMATION:   Selected notes from the MEDICAL RECORD NUMBER Referral from Dr. Parke Simmers for eval of NPDR w/DME OU. VA cc: OD: 20/30- OS: 20/50 Wearing Rx: OD: +1.50+0.25x1 OS: +1.00+0.25x102 MRx: OD:  +1.25+1.25x180 20/25 OS: +1.25 sph 20/40 IOPS 19,17 Mixed form cataract OU, Htn Ret OU    CURRENT MEDICATIONS: No current outpatient medications on file. (Ophthalmic Drugs)   No current facility-administered medications for this visit. (Ophthalmic Drugs)   Current Outpatient Medications (Other)  Medication Sig  . Armodafinil 150 MG tablet Take 150 mg by mouth as directed. 1/2 tablet prn   . atorvastatin (LIPITOR) 10 MG tablet Take 1 tablet (10 mg total) by mouth daily.  . Cholecalciferol (VITAMIN D3 PO) Take by mouth daily.  . cyclobenzaprine (FLEXERIL) 10 MG tablet Take 10 mg by mouth at bedtime.   . DULoxetine (CYMBALTA) 60 MG capsule Take 60 mg by mouth daily.   . furosemide (LASIX) 20 MG tablet Take 20 mg by mouth.   . hydrALAZINE (APRESOLINE) 25 MG tablet Take 1 tablet (25 mg total) by mouth 3 (three) times daily.  . insulin glargine (LANTUS) 100 UNIT/ML injection Inject into the skin 2 (two) times daily. 37 units  . insulin lispro (HUMALOG) 100 UNIT/ML injection Inject 3-15 Units into the skin 4 (four) times daily. Sliding scale  . isosorbide dinitrate (ISORDIL) 30 MG tablet Take 1 tablet (30 mg total) by mouth 3 (three) times daily.  Marland Kitchen labetalol (NORMODYNE) 200 MG tablet Take 1 tablet (200 mg total) by mouth 3 (three) times daily.  . Levothyroxine Sodium 100 MCG CAPS Take 100 mcg by mouth every morning.   Marland Kitchen  losartan (COZAAR) 50 MG tablet Take 50 mg by mouth 2 (two) times daily.   . Multiple Vitamin (MULTIVITAMIN) tablet Take 1 tablet by mouth daily.  . pantoprazole (PROTONIX) 40 MG tablet Take 40 mg by mouth daily.  . rivaroxaban (XARELTO) 2.5 MG TABS tablet Take 2.5 mg by mouth 2 (two) times daily. XARLETO   . fenofibrate 160 MG tablet Take 1 tablet (160 mg total) by mouth daily.   No current facility-administered medications for this visit. (Other)      REVIEW OF SYSTEMS: ROS    Positive for: Endocrine, Cardiovascular, Eyes   Negative for: Constitutional,  Gastrointestinal, Neurological, Skin, Genitourinary, Musculoskeletal, HENT, Respiratory, Psychiatric, Allergic/Imm, Heme/Lymph   Last edited by Roselee Nova D, COT on 12/10/2019  1:06 PM. (History)       ALLERGIES Allergies  Allergen Reactions  . Norvasc [Amlodipine]     Difficulty breathing   . Penicillins   . Statins   . Sulfa Antibiotics Nausea Only  . Latex Rash    Occasional SOB    PAST MEDICAL HISTORY Past Medical History:  Diagnosis Date  . ADD (attention deficit disorder)   . Arthritis    gout  . Blue toe syndrome ([place]) 10/06/2011  . Broken shoulder    And tailbone   . Chronic kidney disease    hx of left nephrectomy  . Diabetes mellitus    type 2  . DM w/o complication type II, uncontrolled 09/06/2011  . Fatty liver   . Fibromyalgia   . Gout 09/06/2011  . History of headache   . History of kidney stones   . History of nephrectomy, unilateral 09/06/2011  . History of PCOS   . History of venous thrombosis and embolism    Left great toe  . Hyperlipidemia   . Hypersomnolence   . Hypertension   . Hypothyroid 09/06/2011  . Hypothyroidism   . Ischemia of extremity 09/06/2011  . Migraine headache 09/06/2011  . Neuromuscular disorder (HCC)    neuropathy to feet bilateral  . Obesity   . Seizure disorder ([place])   . Trouble swallowing    Past Surgical History:  Procedure Laterality Date  . ABDOMINAL HYSTERECTOMY    . HERNIA REPAIR    . LYMPH NODE BIOPSY    . NASAL SINUS SURGERY    . NEPHRECTOMY Left 2002   left nephrectomy  . TONSILLECTOMY  1969  . TUBAL LIGATION      FAMILY HISTORY Family History  Problem Relation Age of Onset  . Other Mother        blood clot history, varicose veins  . Stroke Mother   . Diabetes Mother   . Cancer Father   . Stroke Maternal Grandmother   . Healthy Brother   . Healthy Brother   . Healthy Brother   . Diabetes Daughter     SOCIAL HISTORY Social History   Tobacco Use  . Smoking status: Never Smoker  . Smokeless  tobacco: Never Used  Substance Use Topics  . Alcohol use: No  . Drug use: No         OPHTHALMIC EXAM:  Base Eye Exam    Visual Acuity (Snellen - Linear)      Right Left   Dist cc 20/30 -2 20/30 -2   Dist ph cc 20/20 20/25 +2   Correction: Glasses       Tonometry (Tonopen, 1:19 PM)      Right Left   Pressure 17 15  Pupils      Dark Light Shape React APD   Right 4 3 Round Brisk None   Left 4 3 Round Brisk None       Visual Fields (Counting fingers)      Left Right    Full Full       Extraocular Movement      Right Left    Full, Ortho Full, Ortho       Neuro/Psych    Oriented x3: Yes   Mood/Affect: Normal       Dilation    Both eyes: 1.0% Mydriacyl, 2.5% Phenylephrine @ 1:19 PM        Slit Lamp and Fundus Exam    Slit Lamp Exam      Right Left   Lids/Lashes Dermatochalasis - upper lid, mild Meibomian gland dysfunction Dermatochalasis - upper lid, mild Meibomian gland dysfunction   Conjunctiva/Sclera White and quiet White and quiet   Cornea 1-2+ inferior Punctate epithelial erosions 1-2+ inferior Punctate epithelial erosions   Anterior Chamber Deep and quiet, narrow temporal angle Deep and quiet, narrow temporal angle   Iris Round and dilated, mild anterior bowing Round and dilated, mild anterior bowing   Lens 2-3+ Nuclear sclerosis, 2-3+ Cortical cataract 2-3+ Nuclear sclerosis, 2-3+ Cortical cataract   Vitreous Vitreous syneresis Vitreous syneresis       Fundus Exam      Right Left   Disc Pink and Sharp, Compact Pink and Sharp, mild tilt   C/D Ratio 0.4 0.4   Macula Flat, Good foveal reflex, mild Retinal pigment epithelial mottling, scattered Microaneurysms Blunted foveal reflex, cluster of MA/DBH/punctate exudate ST to fovea with +edema, scattered MA   Vessels Mild Vascular attenuation, mild Tortuousity Vascular attenuation, Tortuous   Periphery Attached, scattered DBH greatest posteriorly    Attached, pigmented cystoid degeneration inferiorly,  scattered MA greatest posteriorly        Refraction    Wearing Rx      Sphere Cylinder Add   Right +1.50 Sphere +1.50   Left +1.00 Sphere +1.50       Manifest Refraction      Sphere Cylinder Axis Dist VA   Right +1.50 +0.50 180 20/25-2   Left +0.25 +0.75 175 20/30-1          IMAGING AND PROCEDURES  Imaging and Procedures for '@TODAY'$ @  OCT, Retina - OU - Both Eyes       Right Eye Quality was good. Central Foveal Thickness: 244. Progression has no prior data. Findings include normal foveal contour, no IRF, no SRF (Partial PVD).   Left Eye Quality was good. Central Foveal Thickness: 266. Progression has no prior data. Findings include normal foveal contour, no SRF, vitreomacular adhesion , intraretinal fluid (Mild, focal cystic changes ST macula extending to fovea).   Notes *Images captured and stored on drive  Diagnosis / Impression:  NFP, no IRF/SRF OU OS: Mild, focal cystic changes ST macula extending to fovea   Clinical management:  See below  Abbreviations: NFP - Normal foveal profile. CME - cystoid macular edema. PED - pigment epithelial detachment. IRF - intraretinal fluid. SRF - subretinal fluid. EZ - ellipsoid zone. ERM - epiretinal membrane. ORA - outer retinal atrophy. ORT - outer retinal tubulation. SRHM - subretinal hyper-reflective material        Fluorescein Angiography Optos (Transit OS)       Right Eye   Progression has no prior data. Early phase findings include microaneurysm. Mid/Late phase findings include microaneurysm, leakage.  Left Eye   Progression has no prior data. Early phase findings include microaneurysm. Mid/Late phase findings include microaneurysm, leakage.   Notes **Images stored on drive**  Impression: Moderate NPDR OU OS with focal cluster of leaking MA ST to fovea                  ASSESSMENT/PLAN:    ICD-10-CM   1. Moderate nonproliferative diabetic retinopathy of left eye with macular edema associated  with type 2 diabetes mellitus ([place])  Q11.9417   2. Moderate nonproliferative diabetic retinopathy of right eye without macular edema associated with type 2 diabetes mellitus ([place])  E08.1448   3. Retinal edema  H35.81 OCT, Retina - OU - Both Eyes  4. Essential hypertension  I10   5. Hypertensive retinopathy of both eyes  H35.033 Fluorescein Angiography Optos (Transit OS)  6. Combined forms of age-related cataract of both eyes  H25.813     1-3. Moderate Non-proliferative diabetic retinopathy, OU  - OD no DME  - OS +DME  - The incidence, risk factors for progression, natural history and treatment options for diabetic retinopathy  were discussed with patient.    - The need for close monitoring of blood glucose, blood pressure, and serum lipids, avoiding cigarette or any type of tobacco, and the need for long term follow up was also discussed with patient.  - exam shows scattered MA and IRH OU (OS > OD); no NV  - FA (04.20.21) w/ leaking MA OU, no NV  - OCT shows diabetic macular edema, left eye   - The natural history, pathology, and characteristics of diabetic macular edema discussed with patient.  A generalized discussion of the major clinical trials concerning treatment of diabetic macular edema (ETDRS, DCT, SCORE, RISE / RIDE, and ongoing DRCR net studies) was completed.  This discussion included mention of the various approaches to treating diabetic macular edema (observation, laser photocoagulation, anti-VEGF injections with lucentis / Avastin / Eylea, steroid injections with Kenalog / Ozurdex, and intraocular surgery with vitrectomy).  The goal hemoglobin A1C of 6-7 was discussed, as well as importance of smoking cessation and hypertension control.  Need for ongoing treatment and monitoring were specifically discussed with reference to chronic nature of diabetic macular edema.  - pt reports recent improvement in A1c from 8 to 6.6 and wishes to hold off on intervention OS -- reasonable given BCVA  20/25 and DME is mild  - f/u in 4-6 wks -- DFE/OCT possible intervention for DME  4,5. Hypertensive retinopathy OU  - discussed importance of tight BP control  - monitor  6. Mixed cataract OU  - The symptoms of cataract, surgical options, and treatments and risks were discussed with patient.  - discussed diagnosis and progression  - not yet visually significant  - monitor for now   Ophthalmic Meds Ordered this visit:  No orders of the defined types were placed in this encounter.      Return for f/u 4-6 weeks, NPDR OU, DFE, OCT.  There are no Patient Instructions on file for this visit.   Explained the diagnoses, plan, and follow up with the patient and they expressed understanding.  Patient expressed understanding of the importance of proper follow up care.  This document serves as a record of services personally performed by Gardiner Sleeper, MD, PhD. It was created on their behalf by Estill Bakes, COT an ophthalmic technician. The creation of this record is the provider's dictation and/or activities during the visit.    Electronically  signed by: Estill Bakes, COT 12/10/19 @ 11:11 PM   This document serves as a record of services personally performed by Gardiner Sleeper, MD, PhD. It was created on their behalf by Ernest Mallick, OA, an ophthalmic assistant. The creation of this record is the provider's dictation and/or activities during the visit.    Electronically signed by: Ernest Mallick, OA 04.20.2021 11:11 PM   Gardiner Sleeper, M.D., Ph.D. Diseases & Surgery of the Retina and Vitreous [facility]  I have reviewed the above documentation for accuracy and completeness, and I agree with the above. Gardiner Sleeper, M.D., Ph.D. 12/10/19 11:11 PM   Abbreviations: M myopia (nearsighted); A astigmatism; H hyperopia (farsighted); P presbyopia; Mrx spectacle prescription;  CTL contact lenses; OD right eye; OS left eye; OU both eyes  XT exotropia; ET  esotropia; PEK punctate epithelial keratitis; PEE punctate epithelial erosions; DES dry eye syndrome; MGD meibomian gland dysfunction; ATs artificial tears; PFAT's preservative free artificial tears; [place] nuclear sclerotic cataract; PSC posterior subcapsular cataract; ERM epi-retinal membrane; PVD posterior vitreous detachment; RD retinal detachment; DM diabetes mellitus; DR diabetic retinopathy; NPDR non-proliferative diabetic retinopathy; PDR proliferative diabetic retinopathy; CSME clinically significant macular edema; DME diabetic macular edema; dbh dot blot hemorrhages; CWS cotton wool spot; POAG primary open angle glaucoma; C/D cup-to-disc ratio; HVF humphrey visual field; GVF goldmann visual field; OCT optical coherence tomography; IOP intraocular pressure; BRVO Branch retinal vein occlusion; CRVO central retinal vein occlusion; CRAO central retinal artery occlusion; BRAO branch retinal artery occlusion; RT retinal tear; SB scleral buckle; PPV pars plana vitrectomy; VH Vitreous hemorrhage; PRP panretinal laser photocoagulation; IVK intravitreal kenalog; VMT vitreomacular traction; MH Macular hole;  NVD neovascularization of the disc; NVE neovascularization elsewhere; AREDS age related eye disease study; ARMD age related macular degeneration; POAG primary open angle glaucoma; EBMD epithelial/anterior basement membrane dystrophy; ACIOL anterior chamber intraocular lens; IOL intraocular lens; PCIOL posterior chamber intraocular lens; Phaco/IOL phacoemulsification with intraocular lens placement; [place] photorefractive keratectomy; LASIK laser assisted in situ keratomileusis; HTN hypertension; DM diabetes mellitus; COPD chronic obstructive pulmonary disease

## 2019-12-16 ENCOUNTER — Encounter: Payer: Self-pay | Admitting: Diagnostic Neuroimaging

## 2019-12-16 ENCOUNTER — Other Ambulatory Visit: Payer: Self-pay

## 2019-12-16 ENCOUNTER — Ambulatory Visit: Payer: Medicare Other | Admitting: Diagnostic Neuroimaging

## 2019-12-16 VITALS — BP 143/84 | HR 72 | Temp 97.7°F | Ht 64.0 in | Wt 250.2 lb

## 2019-12-16 DIAGNOSIS — R413 Other amnesia: Secondary | ICD-10-CM | POA: Diagnosis not present

## 2019-12-16 NOTE — Patient Instructions (Signed)
MILD COGNITIVE IMPAIRMENT (no major changes in ADLs; MMSE stable) - optimize nutrition, exercise, sleep and mood stabilization; stay active mentally, socially and physically - consider depression and ADHD evaluation by psychiatry / psychology
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
## 2019-12-18 ENCOUNTER — Institutional Professional Consult (permissible substitution): Payer: Medicare Other | Admitting: Neurology

## 2019-12-23 ENCOUNTER — Other Ambulatory Visit: Payer: Self-pay | Admitting: Cardiology

## 2019-12-23 DIAGNOSIS — I10 Essential (primary) hypertension: Secondary | ICD-10-CM

## 2019-12-24 ENCOUNTER — Other Ambulatory Visit: Payer: Self-pay | Admitting: Cardiology

## 2019-12-24 DIAGNOSIS — I10 Essential (primary) hypertension: Secondary | ICD-10-CM

## 2019-12-27 ENCOUNTER — Ambulatory Visit: Payer: Medicare Other | Admitting: Cardiology

## 2019-12-28 ENCOUNTER — Other Ambulatory Visit: Payer: Self-pay | Admitting: Cardiology

## 2019-12-28 DIAGNOSIS — E782 Mixed hyperlipidemia: Secondary | ICD-10-CM

## 2019-12-29 ENCOUNTER — Other Ambulatory Visit: Payer: Self-pay | Admitting: Cardiology

## 2019-12-29 DIAGNOSIS — E782 Mixed hyperlipidemia: Secondary | ICD-10-CM

## 2020-01-11 ENCOUNTER — Other Ambulatory Visit: Payer: Self-pay | Admitting: Cardiology

## 2020-01-11 DIAGNOSIS — E782 Mixed hyperlipidemia: Secondary | ICD-10-CM

## 2020-01-14 ENCOUNTER — Encounter (INDEPENDENT_AMBULATORY_CARE_PROVIDER_SITE_OTHER): Payer: Medicare Other | Admitting: Ophthalmology

## 2020-02-06 NOTE — Progress Notes (Signed)
[facility] Note  02/13/2020     CHIEF COMPLAINT Patient presents for Retina Follow Up   HISTORY OF PRESENT ILLNESS: Debra Jordan is a 69 y.o. female who presents to the clinic today for:   HPI    Retina Follow Up    Patient presents with  Diabetic Retinopathy.  In left eye.  This started weeks ago.  Severity is moderate.  Duration of 9 weeks.  Since onset it is stable.  I, the attending physician,  performed the HPI with the patient and updated documentation appropriately.          Comments    69 y/o female pt here for 9 wk f/u for mod NPDR OU.  Was supposed to return in 4-6 wks.  No change in [place] OU.  Denies pain, FOL, floaters.  BS 150 this a.m.  A1C 6.7 2 wks ago.       Last edited by Bernarda Caffey, MD on 02/13/2020 11:16 AM. (History)    Patient states blood sugar is better controlled. A1c decreased from 8 to 6.8. Patient is more careful about sugar intake.  Referring physician: Shon Baton, MD [place],  [place] [id number]  HISTORICAL INFORMATION:   Selected notes from the MEDICAL RECORD NUMBER Referral from Dr. Parke Simmers for eval of NPDR w/DME OU. VA cc: OD: 20/30- OS: 20/50 Wearing Rx: OD: +1.50+0.25x1 OS: +1.00+0.25x102 MRx: OD: +1.25+1.25x180 20/25 OS: +1.25 sph 20/40 IOPS 19,17 Mixed form cataract OU, Htn Ret OU    CURRENT MEDICATIONS: No current outpatient medications on file. (Ophthalmic Drugs)   No current facility-administered medications for this visit. (Ophthalmic Drugs)   Current Outpatient Medications (Other)  Medication Sig  . amLODipine (NORVASC) 10 MG tablet Take 10 mg by mouth at bedtime.  . Armodafinil 150 MG tablet Take 150 mg by mouth as directed. 1/2 tablet prn   . atorvastatin (LIPITOR) 10 MG tablet TAKE 1 TABLET(10 MG) BY MOUTH DAILY  . Cholecalciferol (VITAMIN D3 PO) Take by mouth daily.  . cyclobenzaprine (FLEXERIL) 10 MG tablet Take 10 mg by mouth at bedtime.   . DULoxetine (CYMBALTA) 60 MG  capsule Take 60 mg by mouth daily.   . furosemide (LASIX) 20 MG tablet Take 20 mg by mouth.   . insulin glargine (LANTUS) 100 UNIT/ML injection Inject into the skin 2 (two) times daily. 37 units  . insulin lispro (HUMALOG) 100 UNIT/ML injection Inject 3-15 Units into the skin 4 (four) times daily. Sliding scale  . isosorbide dinitrate (ISORDIL) 30 MG tablet TAKE 1 TABLET(30 MG) BY MOUTH THREE TIMES DAILY  . labetalol (NORMODYNE) 200 MG tablet TAKE 1 TABLET(200 MG) BY MOUTH THREE TIMES DAILY  . Levothyroxine Sodium 100 MCG CAPS Take 100 mcg by mouth every morning.   Marland Kitchen losartan (COZAAR) 50 MG tablet Take 50 mg by mouth 2 (two) times daily.   . Multiple Vitamin (MULTIVITAMIN) tablet Take 1 tablet by mouth daily.  . ondansetron (ZOFRAN) 4 MG tablet Take 4 mg by mouth 3 (three) times daily as needed.  . pantoprazole (PROTONIX) 40 MG tablet Take 40 mg by mouth daily.  . rivaroxaban (XARELTO) 2.5 MG TABS tablet Take 2.5 mg by mouth 2 (two) times daily. XARLETO   . amLODipine (NORVASC) 5 MG tablet Take 5 mg by mouth at bedtime. (Patient not taking: Reported on 02/13/2020)  . fenofibrate 160 MG tablet Take 1 tablet (160 mg total) by mouth daily.  . hydrALAZINE (APRESOLINE) 25 MG tablet  Take 1 tablet (25 mg total) by mouth 3 (three) times daily.   No current facility-administered medications for this visit. (Other)      REVIEW OF SYSTEMS: ROS    Positive for: Endocrine, Eyes   Negative for: Constitutional, Gastrointestinal, Neurological, Skin, Genitourinary, Musculoskeletal, HENT, Cardiovascular, Respiratory, Psychiatric, Allergic/Imm, Heme/Lymph   Last edited by Matthew Folks, COA on 02/13/2020 10:05 AM. (History)       ALLERGIES Allergies  Allergen Reactions  . Norvasc [Amlodipine]     Difficulty breathing   . Penicillins   . Statins   . Sulfa Antibiotics Nausea Only  . Latex Rash    Occasional SOB    PAST MEDICAL HISTORY Past Medical History:  Diagnosis Date  . ADD (attention  deficit disorder)   . Arthritis    gout  . Blue toe syndrome ([place]) 10/06/2011  . Broken shoulder    And tailbone   . Cataract    Mixed form OU  . Chronic kidney disease    hx of left nephrectomy  . Diabetes mellitus    type 2  . Diabetic retinopathy ([place])    NPDR OU  . DM w/o complication type II, uncontrolled 09/06/2011  . Fatty liver   . Fibromyalgia   . Gout 09/06/2011  . History of headache   . History of kidney stones   . History of nephrectomy, unilateral 09/06/2011  . History of PCOS   . History of venous thrombosis and embolism    Left great toe  . Hyperlipidemia   . Hypersomnolence   . Hypertension   . Hypertensive retinopathy    OU  . Hypothyroid 09/06/2011  . Hypothyroidism   . Ischemia of extremity 09/06/2011  . Migraine headache 09/06/2011  . Neuromuscular disorder (H[facility])    neuropathy to feet bilateral  . Obesity   . Seizure disorder ([place])   . Trouble swallowing    Past Surgical History:  Procedure Laterality Date  . ABDOMINAL HYSTERECTOMY    . HERNIA REPAIR    . LYMPH NODE BIOPSY    . NASAL SINUS SURGERY    . NEPHRECTOMY Left 2002   left nephrectomy  . TONSILLECTOMY  1969  . TUBAL LIGATION      FAMILY HISTORY Family History  Problem Relation Age of Onset  . Other Mother        blood clot history, varicose veins  . Stroke Mother   . Diabetes Mother   . Cancer Father   . Stroke Maternal Grandmother   . Healthy Brother   . Healthy Brother   . Healthy Brother   . Diabetes Daughter     SOCIAL HISTORY Social History   Tobacco Use  . Smoking status: Never Smoker  . Smokeless tobacco: Never Used  Vaping Use  . Vaping Use: Never used  Substance Use Topics  . Alcohol use: No  . Drug use: No         OPHTHALMIC EXAM:  Base Eye Exam    Visual Acuity (Snellen - Linear)      Right Left   Dist cc 20/30 20/30   Dist ph cc 20/20 -2 20/25       Tonometry (Tonopen, 10:09 AM)      Right Left   Pressure 17 17       Pupils      Dark  Light Shape React APD   Right 4 3 Round Brisk None   Left 4 3 Round Brisk None  Visual Fields (Counting fingers)      Left Right    Full Full       Extraocular Movement      Right Left    Full, Ortho Full, Ortho       Neuro/Psych    Oriented x3: Yes   Mood/Affect: Normal       Dilation    Both eyes: 1.0% Mydriacyl, 2.5% Phenylephrine @ 10:09 AM        Slit Lamp and Fundus Exam    Slit Lamp Exam      Right Left   Lids/Lashes Dermatochalasis - upper lid, mild Meibomian gland dysfunction Dermatochalasis - upper lid, mild Meibomian gland dysfunction   Conjunctiva/Sclera White and quiet White and quiet   Cornea trace inferior Punctate epithelial erosions tracew inferior Punctate epithelial erosions   Anterior Chamber Deep and quiet, narrow temporal angle Deep and quiet, narrow temporal angle   Iris Round and dilated, mild anterior bowing Round and dilated, mild anterior bowing   Lens 2-3+ Nuclear sclerosis, 2-3+ Cortical cataract 2-3+ Nuclear sclerosis, 2-3+ Cortical cataract   Vitreous Vitreous syneresis Vitreous syneresis       Fundus Exam      Right Left   Disc Pink and Sharp, Compact Pink and Sharp, mild tilt   C/D Ratio 0.4 0.4   Macula Flat, Good foveal reflex, mild Retinal pigment epithelial mottling, scattered Microaneurysms Blunted foveal reflex, cluster of MA/DBH/punctate exudate ST to fovea with +edema--improved, scattered MA   Vessels Mild Vascular attenuation, mild Tortuousity Vascular attenuation, Tortuous   Periphery Attached, scattered DBH greatest posteriorly    Attached, pigmented cystoid degeneration inferiorly, scattered MA greatest posteriorly          IMAGING AND PROCEDURES  Imaging and Procedures for _0 @  OCT, Retina - OU - Both Eyes       Right Eye Quality was good. Central Foveal Thickness: 241. Progression has been stable. Findings include normal foveal contour, no IRF, no SRF (Partial PVD, trace cystic changes).   Left  Eye Quality was good. Central Foveal Thickness: 250. Progression has improved. Findings include normal foveal contour, no SRF, vitreomacular adhesion , intraretinal fluid (Mild, focal cystic changes ST macula extending to fovea).   Notes *Images captured and stored on drive  Diagnosis / Impression:  NFP, no IRF/SRF OU OS: Mild, interval improvement in focal cystic changes/IRF ST macula   Clinical management:  See below  Abbreviations: NFP - Normal foveal profile. CME - cystoid macular edema. PED - pigment epithelial detachment. IRF - intraretinal fluid. SRF - subretinal fluid. EZ - ellipsoid zone. ERM - epiretinal membrane. ORA - outer retinal atrophy. ORT - outer retinal tubulation. SRHM - subretinal hyper-reflective material                 ASSESSMENT/PLAN:    ICD-10-CM   1. Moderate nonproliferative diabetic retinopathy of left eye with macular edema associated with type 2 diabetes mellitus ([place])  C37.6283   2. Moderate nonproliferative diabetic retinopathy of right eye without macular edema associated with type 2 diabetes mellitus ([place])  T51.7616   3. Retinal edema  H35.81 OCT, Retina - OU - Both Eyes  4. Combined forms of age-related cataract of both eyes  H25.813   5. Hypertensive retinopathy of both eyes  H35.033   6. Essential hypertension  I10     1-3. Moderate Non-proliferative diabetic retinopathy, OU  - OD no DME  - OS +DME -- improved  - A1c improved to 6.2 from 8  per pt report  - exam shows scattered MA and IRH OU (OS > OD); no NV  - FA (04.20.21) w/ leaking MA OU, no NV  - OCT shows diabetic macular edema with interval improvement in focal cystic changes ST macula, left eye   - pt reports recent improvement in A1c from 8 to 6.8 and wishes to hold off on intervention OS -- reasonable given BCVA 20/25 and DME is mild, OCT shows interval improvement in cystic changes/IRF  - f/u in 2-3 months, sooner prn -- DFE, OCT  4,5. Hypertensive retinopathy OU  -  discussed importance of tight BP control  - monitor  6. Mixed cataract OU  - The symptoms of cataract, surgical options, and treatments and risks were discussed with patient.  - discussed diagnosis and progression  - not yet visually significant  - monitor for now   Ophthalmic Meds Ordered this visit:  No orders of the defined types were placed in this encounter.      Return 2-3 months, for DFE, OCT.  There are no Patient Instructions on file for this visit.   Explained the diagnoses, plan, and follow up with the patient and they expressed understanding.  Patient expressed understanding of the importance of proper follow up care.   This document serves as a record of services personally performed by Gardiner Sleeper, MD, PhD. It was created on their behalf by Roselee Nova, COMT. The creation of this record is the provider's dictation and/or activities during the visit.  Electronically signed by: Roselee Nova, COMT 02/15/20 11:07 PM   This document serves as a record of services personally performed by Gardiner Sleeper, MD, PhD. It was created on their behalf by Leeann Must, [place], a certified ophthalmic assistant. The creation of this record is the provider's dictation and/or activities during the visit.    Electronically signed by: Leeann Must, COA _0 @ 11:07 PM  Gardiner Sleeper, M.D., Ph.D. Diseases & Surgery of the Retina and Vitreous [facility]  I have reviewed the above documentation for accuracy and completeness, and I agree with the above. Gardiner Sleeper, M.D., Ph.D. 02/15/20 11:07 PM   Abbreviations: M myopia (nearsighted); A astigmatism; H hyperopia (farsighted); P presbyopia; Mrx spectacle prescription;  CTL contact lenses; OD right eye; OS left eye; OU both eyes  XT exotropia; ET esotropia; PEK punctate epithelial keratitis; PEE punctate epithelial erosions; DES dry eye syndrome; MGD meibomian gland dysfunction; ATs artificial tears;  PFAT's preservative free artificial tears; [place] nuclear sclerotic cataract; PSC posterior subcapsular cataract; ERM epi-retinal membrane; PVD posterior vitreous detachment; RD retinal detachment; DM diabetes mellitus; DR diabetic retinopathy; NPDR non-proliferative diabetic retinopathy; PDR proliferative diabetic retinopathy; CSME clinically significant macular edema; DME diabetic macular edema; dbh dot blot hemorrhages; CWS cotton wool spot; POAG primary open angle glaucoma; C/D cup-to-disc ratio; HVF humphrey visual field; GVF goldmann visual field; OCT optical coherence tomography; IOP intraocular pressure; BRVO Branch retinal vein occlusion; CRVO central retinal vein occlusion; CRAO central retinal artery occlusion; BRAO branch retinal artery occlusion; RT retinal tear; SB scleral buckle; PPV pars plana vitrectomy; VH Vitreous hemorrhage; PRP panretinal laser photocoagulation; IVK intravitreal kenalog; VMT vitreomacular traction; MH Macular hole;  NVD neovascularization of the disc; NVE neovascularization elsewhere; AREDS age related eye disease study; ARMD age related macular degeneration; POAG primary open angle glaucoma; EBMD epithelial/anterior basement membrane dystrophy; ACIOL anterior chamber intraocular lens; IOL intraocular lens; PCIOL posterior chamber intraocular lens; Phaco/IOL phacoemulsification with intraocular lens placement; PRK photorefractive  keratectomy; LASIK laser assisted in situ keratomileusis; HTN hypertension; DM diabetes mellitus; COPD chronic obstructive pulmonary disease

## 2020-02-13 ENCOUNTER — Encounter (INDEPENDENT_AMBULATORY_CARE_PROVIDER_SITE_OTHER): Payer: Self-pay | Admitting: Ophthalmology

## 2020-02-13 ENCOUNTER — Other Ambulatory Visit: Payer: Self-pay

## 2020-02-13 ENCOUNTER — Ambulatory Visit (INDEPENDENT_AMBULATORY_CARE_PROVIDER_SITE_OTHER): Payer: Medicare Other | Admitting: Ophthalmology

## 2020-02-13 DIAGNOSIS — E113391 Type 2 diabetes mellitus with moderate nonproliferative diabetic retinopathy without macular edema, right eye: Secondary | ICD-10-CM | POA: Diagnosis not present

## 2020-02-13 DIAGNOSIS — E113312 Type 2 diabetes mellitus with moderate nonproliferative diabetic retinopathy with macular edema, left eye: Secondary | ICD-10-CM | POA: Diagnosis not present

## 2020-02-13 DIAGNOSIS — H25813 Combined forms of age-related cataract, bilateral: Secondary | ICD-10-CM | POA: Diagnosis not present

## 2020-02-13 DIAGNOSIS — I10 Essential (primary) hypertension: Secondary | ICD-10-CM

## 2020-02-13 DIAGNOSIS — H3581 Retinal edema: Secondary | ICD-10-CM | POA: Diagnosis not present

## 2020-02-13 DIAGNOSIS — H35033 Hypertensive retinopathy, bilateral: Secondary | ICD-10-CM

## 2020-02-28 ENCOUNTER — Ambulatory Visit: Payer: Medicare Other | Admitting: Cardiology

## 2020-05-08 ENCOUNTER — Telehealth: Payer: Self-pay | Admitting: Diagnostic Neuroimaging

## 2020-05-08 NOTE — Progress Notes (Signed)
[facility] Note  05/13/2020     CHIEF COMPLAINT Patient presents for Retina Follow Up   HISTORY OF PRESENT ILLNESS: Debra Jordan is a 69 y.o. female who presents to the clinic today for:   HPI    Retina Follow Up    Patient presents with  Diabetic Retinopathy.  In left eye.  This started months ago.  Severity is moderate.  Duration of months.  Since onset it is stable.  I, the attending physician,  performed the HPI with the patient and updated documentation appropriately.          Comments    Pt states vision fluctuates--some days is good and some days is bad.  Patient denies eye pain or discomfort and denies any new or worsening floaters or fol OU.       Last edited by Bernarda Caffey, MD on 05/13/2020 11:37 AM. (History)    Patient states her vision is "up and down", pt states she has been taking care of her special needs daughter who broke her ankle and had to have sx 6 weeks ago, she states she can tell it is affecting her blood sugar  Referring physician: [place], [place], [address] [place],  [place] [address]  HISTORICAL INFORMATION:   Selected notes from the MEDICAL RECORD NUMBER Referral from Dr. Parke Simmers for eval of NPDR w/DME OU. VA cc: OD: 20/30- OS: 20/50 Wearing Rx: OD: +1.50+0.25x1 OS: +1.00+0.25x102 MRx: OD: +1.25+1.25x180 20/25 OS: +1.25 sph 20/40 IOPS 19,17 Mixed form cataract OU, Htn Ret OU    CURRENT MEDICATIONS: No current outpatient medications on file. (Ophthalmic Drugs)   No current facility-administered medications for this visit. (Ophthalmic Drugs)   Current Outpatient Medications (Other)  Medication Sig  . amLODipine (NORVASC) 10 MG tablet Take 10 mg by mouth at bedtime.  Marland Kitchen amLODipine (NORVASC) 5 MG tablet Take 5 mg by mouth at bedtime. (Patient not taking: Reported on 02/13/2020)  . Armodafinil 150 MG tablet Take 150 mg by mouth as directed. 1/2 tablet prn   . atorvastatin (LIPITOR) 10 MG tablet TAKE 1  TABLET(10 MG) BY MOUTH DAILY  . Cholecalciferol (VITAMIN D3 PO) Take by mouth daily.  . cyclobenzaprine (FLEXERIL) 10 MG tablet Take 10 mg by mouth at bedtime.   . DULoxetine (CYMBALTA) 60 MG capsule Take 60 mg by mouth daily.   . fenofibrate 160 MG tablet Take 1 tablet (160 mg total) by mouth daily.  . furosemide (LASIX) 20 MG tablet Take 20 mg by mouth.   . hydrALAZINE (APRESOLINE) 25 MG tablet Take 1 tablet (25 mg total) by mouth 3 (three) times daily.  . insulin glargine (LANTUS) 100 UNIT/ML injection Inject into the skin 2 (two) times daily. 37 units  . insulin lispro (HUMALOG) 100 UNIT/ML injection Inject 3-15 Units into the skin 4 (four) times daily. Sliding scale  . isosorbide dinitrate (ISORDIL) 30 MG tablet TAKE 1 TABLET(30 MG) BY MOUTH THREE TIMES DAILY  . labetalol (NORMODYNE) 200 MG tablet TAKE 1 TABLET(200 MG) BY MOUTH THREE TIMES DAILY  . Levothyroxine Sodium 100 MCG CAPS Take 100 mcg by mouth every morning.   Marland Kitchen losartan (COZAAR) 50 MG tablet Take 50 mg by mouth 2 (two) times daily.   . Multiple Vitamin (MULTIVITAMIN) tablet Take 1 tablet by mouth daily.  . ondansetron (ZOFRAN) 4 MG tablet Take 4 mg by mouth 3 (three) times daily as needed.  . pantoprazole (PROTONIX) 40 MG tablet Take 40 mg by mouth  daily.  . rivaroxaban (XARELTO) 2.5 MG TABS tablet Take 2.5 mg by mouth 2 (two) times daily. XARLETO    No current facility-administered medications for this visit. (Other)      REVIEW OF SYSTEMS: ROS    Positive for: Endocrine, Eyes   Negative for: Constitutional, Gastrointestinal, Neurological, Skin, Genitourinary, Musculoskeletal, HENT, Cardiovascular, Respiratory, Psychiatric, Allergic/Imm, Heme/Lymph   Last edited by Doneen Poisson on 05/13/2020  9:19 AM. (History)       ALLERGIES Allergies  Allergen Reactions  . Norvasc [Amlodipine]     Difficulty breathing   . Penicillins   . Statins   . Sulfa Antibiotics Nausea Only  . Latex Rash    Occasional SOB     PAST MEDICAL HISTORY Past Medical History:  Diagnosis Date  . ADD (attention deficit disorder)   . Arthritis    gout  . Blue toe syndrome ([place]) 10/06/2011  . Broken shoulder    And tailbone   . Cataract    Mixed form OU  . Chronic kidney disease    hx of left nephrectomy  . Diabetes mellitus    type 2  . Diabetic retinopathy ([place])    NPDR OU  . DM w/o complication type II, uncontrolled 09/06/2011  . Fatty liver   . Fibromyalgia   . Gout 09/06/2011  . History of headache   . History of kidney stones   . History of nephrectomy, unilateral 09/06/2011  . History of PCOS   . History of venous thrombosis and embolism    Left great toe  . Hyperlipidemia   . Hypersomnolence   . Hypertension   . Hypertensive retinopathy    OU  . Hypothyroid 09/06/2011  . Hypothyroidism   . Ischemia of extremity 09/06/2011  . Migraine headache 09/06/2011  . Neuromuscular disorder (H[facility])    neuropathy to feet bilateral  . Obesity   . Seizure disorder ([place])   . Trouble swallowing    Past Surgical History:  Procedure Laterality Date  . ABDOMINAL HYSTERECTOMY    . HERNIA REPAIR    . LYMPH NODE BIOPSY    . NASAL SINUS SURGERY    . NEPHRECTOMY Left 2002   left nephrectomy  . TONSILLECTOMY  1969  . TUBAL LIGATION      FAMILY HISTORY Family History  Problem Relation Age of Onset  . Other Mother        blood clot history, varicose veins  . Stroke Mother   . Diabetes Mother   . Cancer Father   . Stroke Maternal Grandmother   . Healthy Brother   . Healthy Brother   . Healthy Brother   . Diabetes Daughter     SOCIAL HISTORY Social History   Tobacco Use  . Smoking status: Never Smoker  . Smokeless tobacco: Never Used  Vaping Use  . Vaping Use: Never used  Substance Use Topics  . Alcohol use: No  . Drug use: No         OPHTHALMIC EXAM:  Base Eye Exam    Visual Acuity (Snellen - Linear)      Right Left   Dist cc 20/25 -2 20/30 -1   Dist ph cc NI 20/25 -2   Correction:  Glasses       Tonometry (Tonopen, 9:24 AM)      Right Left   Pressure 13 14       Pupils      Dark Light Shape React APD   Right 3 2 Round Brisk  0   Left 3 2 Round Brisk 0       Visual Fields      Left Right    Full Full       Extraocular Movement      Right Left    Full Full       Neuro/Psych    Oriented x3: Yes   Mood/Affect: Normal       Dilation    Both eyes: 1.0% Mydriacyl, 2.5% Phenylephrine @ 9:24 AM        Slit Lamp and Fundus Exam    Slit Lamp Exam      Right Left   Lids/Lashes Dermatochalasis - upper lid, mild Meibomian gland dysfunction Dermatochalasis - upper lid, mild Meibomian gland dysfunction   Conjunctiva/Sclera White and quiet White and quiet   Cornea trace inferior Punctate epithelial erosions trace inferior Punctate epithelial erosions   Anterior Chamber Deep and quiet, narrow temporal angle Deep and quiet, narrow temporal angle   Iris Round and dilated, mild anterior bowing Round and dilated, mild anterior bowing   Lens 2-3+ Nuclear sclerosis, 2-3+ Cortical cataract 2-3+ Nuclear sclerosis, 2-3+ Cortical cataract   Vitreous Vitreous syneresis, Posterior vitreous detachment Vitreous syneresis       Fundus Exam      Right Left   Disc Pink and Sharp, Compact Pink and Sharp, mild tilt   C/D Ratio 0.4 0.4   Macula Flat, Good foveal reflex, mild Retinal pigment epithelial mottling, scattered Microaneurysms, scattered cystic changes Blunted foveal reflex, scattered MA/IRHcystic changes   Vessels Vascular attenuation, mild Tortuousity Vascular attenuation, Tortuous   Periphery Attached, scattered DBH greatest posteriorly    Attached, pigmented cystoid degeneration inferiorly, scattered MA greatest posteriorly, focal DBH IN to disc        Refraction    Wearing Rx      Sphere Cylinder Add   Right +1.50 Sphere +1.50   Left +1.00 Sphere +1.50          IMAGING AND PROCEDURES  Imaging and Procedures for $RemoveBefore'@[email or web address]$ @  OCT, Retina - OU - Both Eyes        Right Eye Quality was good. Central Foveal Thickness: 239. Progression has worsened. Findings include normal foveal contour, no SRF, intraretinal fluid (Partial PVD, trace cystic changes temporal macula slightly increased from prior).   Left Eye Quality was good. Central Foveal Thickness: 247. Progression has improved. Findings include normal foveal contour, no SRF, vitreomacular adhesion , intraretinal fluid (Mild improvement in cystic changes temporal macula).   Notes *Images captured and stored on drive  Diagnosis / Impression:  NFP, no SRF OU OD: trace cystic changes temporal macula slightly increased from prior OS: Mild improvement in cystic changes temporal macula  Clinical management:  See below  Abbreviations: NFP - Normal foveal profile. CME - cystoid macular edema. PED - pigment epithelial detachment. IRF - intraretinal fluid. SRF - subretinal fluid. EZ - ellipsoid zone. ERM - epiretinal membrane. ORA - outer retinal atrophy. ORT - outer retinal tubulation. SRHM - subretinal hyper-reflective material                 ASSESSMENT/PLAN:    ICD-10-CM   1. Moderate nonproliferative diabetic retinopathy of both eyes with macular edema associated with type 2 diabetes mellitus ([place])  X79.3903   2. Retinal edema  H35.81 OCT, Retina - OU - Both Eyes  3. Essential hypertension  I10   4. Hypertensive retinopathy of both eyes  H35.033   5. Combined forms of age-related cataract  of both eyes  H25.813     1,2. Moderate Non-proliferative diabetic retinopathy w/ DME OU  - A1c improved to 6.2 from 8 per pt report  - exam shows scattered MA and IRH OU; no NV  - FA (04.20.21) w/ leaking MA OU, no NV  - BCVA 20/25 OU (OD down from 20/20, OS stable  - OCT shows OD: trace cystic changes temporal macula slightly increased from prior; OS: mild improvement in cystic changes temporal macula  - pt reports recent stressors have impacted BG control -- pt's special needs daughter broke  ankle and is on bedrest for 3 mos  - no intervention recommended -- continue monitoring  - f/u in 3-4 months, sooner prn -- DFE, OCT  4,5. Hypertensive retinopathy OU  - discussed importance of tight BP control  - monitor  6. Mixed cataract OU  - The symptoms of cataract, surgical options, and treatments and risks were discussed with patient.  - discussed diagnosis and progression  - not yet visually significant  - monitor for now   Ophthalmic Meds Ordered this visit:  No orders of the defined types were placed in this encounter.      Return for f/u 3-4 months, NPDR OU, DFE, OCT.  There are no Patient Instructions on file for this visit.   Explained the diagnoses, plan, and follow up with the patient and they expressed understanding.  Patient expressed understanding of the importance of proper follow up care.   This document serves as a record of services personally performed by Gardiner Sleeper, MD, PhD. It was created on their behalf by Roselee Nova, COMT. The creation of this record is the provider's dictation and/or activities during the visit.  Electronically signed by: Roselee Nova, COMT 05/13/20 11:51 AM   This document serves as a record of services personally performed by Gardiner Sleeper, MD, PhD. It was created on their behalf by San Jetty. Owens Shark, OA an ophthalmic technician. The creation of this record is the provider's dictation and/or activities during the visit.    Electronically signed by: San Jetty. Owens Shark, [place] 09.22.2021 11:51 AM  Gardiner Sleeper, M.D., Ph.D. Diseases & Surgery of the Retina and Vitreous [facility]  I have reviewed the above documentation for accuracy and completeness, and I agree with the above. Gardiner Sleeper, M.D., Ph.D. 05/13/20 11:51 AM   Abbreviations: M myopia (nearsighted); A astigmatism; H hyperopia (farsighted); P presbyopia; Mrx spectacle prescription;  CTL contact lenses; OD right eye; OS left eye; OU both  eyes  XT exotropia; ET esotropia; PEK punctate epithelial keratitis; PEE punctate epithelial erosions; DES dry eye syndrome; MGD meibomian gland dysfunction; ATs artificial tears; PFAT's preservative free artificial tears; Tat [place] nuclear sclerotic cataract; PSC posterior subcapsular cataract; ERM epi-retinal membrane; PVD posterior vitreous detachment; RD retinal detachment; DM diabetes mellitus; DR diabetic retinopathy; NPDR non-proliferative diabetic retinopathy; PDR proliferative diabetic retinopathy; CSME clinically significant macular edema; DME diabetic macular edema; dbh dot blot hemorrhages; CWS cotton wool spot; POAG primary open angle glaucoma; C/D cup-to-disc ratio; HVF humphrey visual field; GVF goldmann visual field; OCT optical coherence tomography; IOP intraocular pressure; BRVO Branch retinal vein occlusion; CRVO central retinal vein occlusion; CRAO central retinal artery occlusion; BRAO branch retinal artery occlusion; RT retinal tear; SB scleral buckle; PPV pars plana vitrectomy; VH Vitreous hemorrhage; PRP panretinal laser photocoagulation; IVK intravitreal kenalog; VMT vitreomacular traction; MH Macular hole;  NVD neovascularization of the disc; NVE neovascularization elsewhere; AREDS age related eye disease study;  ARMD age related macular degeneration; POAG primary open angle glaucoma; EBMD epithelial/anterior basement membrane dystrophy; ACIOL anterior chamber intraocular lens; IOL intraocular lens; PCIOL posterior chamber intraocular lens; Phaco/IOL phacoemulsification with intraocular lens placement; [place] photorefractive keratectomy; LASIK laser assisted in situ keratomileusis; HTN hypertension; DM diabetes mellitus; COPD chronic obstructive pulmonary disease

## 2020-05-08 NOTE — Telephone Encounter (Signed)
Pt is asking for a call from RN to discuss her worsening symptoms since Covid-19 from 07-04 of last year.  Pt has the following: loss of smell & taste, her hands shake, headaches, when pt is exhausted her legs shake.  Please call.

## 2020-05-11 NOTE — Telephone Encounter (Signed)
Called patient and advised she call her PCP. She stated she already has and "it wasn't helpful". She has not had [place] vaccinations. I advised she get tested for Covid. She verbalized understanding, appreciation.

## 2020-05-13 ENCOUNTER — Other Ambulatory Visit: Payer: Self-pay

## 2020-05-13 ENCOUNTER — Encounter (INDEPENDENT_AMBULATORY_CARE_PROVIDER_SITE_OTHER): Payer: Self-pay | Admitting: Ophthalmology

## 2020-05-13 ENCOUNTER — Ambulatory Visit (INDEPENDENT_AMBULATORY_CARE_PROVIDER_SITE_OTHER): Payer: Medicare Other | Admitting: Ophthalmology

## 2020-05-13 DIAGNOSIS — E113313 Type 2 diabetes mellitus with moderate nonproliferative diabetic retinopathy with macular edema, bilateral: Secondary | ICD-10-CM

## 2020-05-13 DIAGNOSIS — I10 Essential (primary) hypertension: Secondary | ICD-10-CM

## 2020-05-13 DIAGNOSIS — H35033 Hypertensive retinopathy, bilateral: Secondary | ICD-10-CM | POA: Diagnosis not present

## 2020-05-13 DIAGNOSIS — H25813 Combined forms of age-related cataract, bilateral: Secondary | ICD-10-CM

## 2020-05-13 DIAGNOSIS — H3581 Retinal edema: Secondary | ICD-10-CM

## 2020-06-01 ENCOUNTER — Ambulatory Visit: Payer: Medicare Other | Admitting: Cardiology

## 2020-06-04 ENCOUNTER — Other Ambulatory Visit: Payer: Self-pay

## 2020-06-04 ENCOUNTER — Encounter: Payer: Self-pay | Admitting: Cardiology

## 2020-06-04 ENCOUNTER — Ambulatory Visit: Payer: Medicare Other | Admitting: Cardiology

## 2020-06-04 VITALS — BP 117/68 | HR 90 | Resp 16 | Ht 64.0 in | Wt 239.0 lb

## 2020-06-04 DIAGNOSIS — I10 Essential (primary) hypertension: Secondary | ICD-10-CM

## 2020-06-04 DIAGNOSIS — M7989 Other specified soft tissue disorders: Secondary | ICD-10-CM

## 2020-06-04 MED ORDER — LABETALOL HCL 100 MG PO TABS: 50.0000 mg | ORAL_TABLET | Freq: Two times a day (BID) | ORAL | 1 refills | Status: DC

## 2020-06-04 MED ORDER — FUROSEMIDE 20 MG PO TABS: 20.0000 mg | ORAL_TABLET | Freq: Every day | ORAL | Status: DC | PRN

## 2020-06-04 NOTE — Progress Notes (Signed)
Primary Physician/Referring:  Shon Baton, MD  Patient ID: Debra Jordan, female    DOB: 1950/12/27, 69 y.o.   MRN: [id number]  Chief Complaint  Patient presents with  . Hypertension  . Follow-up    6 month   HPI:    HPI: Debra Jordan  is a 69 y.o. with type 2 diabetes with stage 3 CKD and h/o left nephrectomy for pelvic kidney in 1995,, hyperlipidemia, hypertension, hypothyroidism, history of migraines, history of gout,  Chronic back pain, history of recurrent embolic toe ischemia in 3149 (left great toe and right 2nd toe) and has been on anticoagulation since, hypercoagulable work up negative, echo in 2013 with bubble study negative for shunt.  He is presently on chronic low-dose Xarelto with no recurrence.  I seen her almost 6 to 7 months ago, about a month ago she had an episode of syncope related to low blood pressure and hence labetalol, hydralazine and isosorbide mononitrate were discontinued.  Prior to this 2 months ago she had Covid 19 variant infection and also she is presently going through treatment for UTI.  Since making lifestyle changes she has lost about 10 pounds in weight, leg edema is essentially resolved and she has been using furosemide very rarely.  No other specific complaints today.  Past Medical History:  Diagnosis Date  . ADD (attention deficit disorder)   . Arthritis    gout  . Blue toe syndrome ([place]) 10/06/2011  . Broken shoulder    And tailbone   . Cataract    Mixed form OU  . Chronic kidney disease    hx of left nephrectomy  . Diabetes mellitus    type 2  . Diabetic retinopathy ([place])    NPDR OU  . DM w/o complication type II, uncontrolled 09/06/2011  . Fatty liver   . Fibromyalgia   . Gout 09/06/2011  . History of headache   . History of kidney stones   . History of nephrectomy, unilateral 09/06/2011  . History of PCOS   . History of venous thrombosis and embolism    Left great toe  . Hyperlipidemia   . Hypersomnolence   . Hypertension   .  Hypertensive retinopathy    OU  . Hypothyroid 09/06/2011  . Hypothyroidism   . Ischemia of extremity 09/06/2011  . Migraine headache 09/06/2011  . Neuromuscular disorder (HCC)    neuropathy to feet bilateral  . Obesity   . Seizure disorder ([place])   . Trouble swallowing    Past Surgical History:  Procedure Laterality Date  . ABDOMINAL HYSTERECTOMY    . HERNIA REPAIR    . LYMPH NODE BIOPSY    . NASAL SINUS SURGERY    . NEPHRECTOMY Left 2002   left nephrectomy  . TONSILLECTOMY  1969  . TUBAL LIGATION     Social History   Tobacco Use  . Smoking status: Never Smoker  . Smokeless tobacco: Never Used  Substance Use Topics  . Alcohol use: No  Marital Status: Widowed   ROS  Review of Systems  Constitutional: Negative for malaise/fatigue and weight gain.  Cardiovascular: Positive for dyspnea on exertion (stable) and leg swelling (occasional). Negative for syncope.  Respiratory: Negative for hemoptysis.   Endocrine: Negative for cold intolerance.  Hematologic/Lymphatic: Does not bruise/bleed easily.  Musculoskeletal: Positive for arthritis.  Gastrointestinal: Negative for melena.  Neurological: Negative for headaches and light-headedness.  Psychiatric/Behavioral: Positive for depression and memory loss.   Objective  Blood pressure 117/68, pulse 90, resp.  rate 16, height 5\' 4"  (1.626 m), weight 239 lb (108.4 kg), SpO2 98 %. Body mass index is 41.02 kg/m.   Vitals with BMI 06/04/2020 12/16/2019 11/29/2019  Height 5\' 4"  5\' 4"  5\' 4"   Weight 239 lbs 250 lbs 3 oz 244 lbs  BMI 41 60.45 40.98  Systolic 119 147 829  Diastolic 68 84 67  Pulse 90 72 75    Physical Exam Constitutional:      Comments: She is moderately built and morbidly obese in no acute distress.  HENT:     Head: Atraumatic.  Eyes:     Conjunctiva/sclera: Conjunctivae normal.  Neck:     Thyroid: No thyromegaly.  Cardiovascular:     Rate and Rhythm: Normal rate and regular rhythm.     Pulses: Intact distal  pulses.     Heart sounds: Normal heart sounds. No murmur heard.  No gallop.      Comments: 1+ bilateral pitting leg edema. No JVD. Pulmonary:     Effort: Pulmonary effort is normal.     Breath sounds: Normal breath sounds.  Abdominal:     General: Bowel sounds are normal.     Palpations: Abdomen is soft.  Musculoskeletal:     Cervical back: Neck supple.  Neurological:     Mental Status: She is alert.    Radiology: No results found.  Laboratory examination:   External labs  Cholesterol, total 221.000 m 05/13/2019 HDL 38 MG/DL 05/13/2019 LDL 140.000 m 05/13/2019 Triglycerides 213.000 05/13/2019  A1C 7.900 % 08/01/2019. TSH 3.620 08/05/2019  Hemoglobin 13.100 g/ 08/05/2019 Platelets 215.000 X 11/07/2018  Creatinine, Serum 1.500 mg/ 08/05/2019 Potassium 4.500 MM 11/06/2015 ALT (SGPT) 32.000 uni 05/13/2019  Medications   Current Outpatient Medications on File Prior to Visit  Medication Sig Dispense Refill  . amLODipine (NORVASC) 5 MG tablet Take 5 mg by mouth at bedtime.     . Cholecalciferol (VITAMIN D3 PO) Take by mouth daily.    . ciprofloxacin (CIPRO) 250 MG tablet Take 250 mg by mouth 2 (two) times daily.    . cyclobenzaprine (FLEXERIL) 10 MG tablet Take 10 mg by mouth at bedtime.     . DULoxetine (CYMBALTA) 60 MG capsule Take 60 mg by mouth daily.   0  . fenofibrate 160 MG tablet Take 1 tablet (160 mg total) by mouth daily.    . insulin glargine (LANTUS) 100 UNIT/ML injection Inject into the skin 2 (two) times daily. 37 units    . insulin lispro (HUMALOG) 100 UNIT/ML injection Inject 3-15 Units into the skin 4 (four) times daily. Sliding scale    . Levothyroxine Sodium 100 MCG CAPS Take 100 mcg by mouth every morning.     Marland Kitchen losartan (COZAAR) 50 MG tablet Take 50 mg by mouth 2 (two) times daily.     . Multiple Vitamin (MULTIVITAMIN) tablet Take 1 tablet by mouth daily.    . ondansetron (ZOFRAN) 4 MG tablet Take 4 mg by mouth 3 (three) times daily as needed.    .  pantoprazole (PROTONIX) 40 MG tablet Take 40 mg by mouth daily.    . rivaroxaban (XARELTO) 2.5 MG TABS tablet Take 2.5 mg by mouth 2 (two) times daily. XARLETO     . Armodafinil 150 MG tablet Take 150 mg by mouth as directed. 1/2 tablet prn      No current facility-administered medications on file prior to visit.     Cardiac Studies:   MRA of the abdomen and bilateral lower extremity for 08/11/11:  Normal lower extremity MRA, single right kidney, no evidence of renal artery stenosis.  No significant disease of the aorta.  Echocardiogram 2013:Bubble  study negative for to cardiac shunting, normal LV systolic function, grade 1 diastolic dysfunction.  Echocardiogram 09/06/2019:  Left ventricle cavity is normal in size. Moderate concentric hypertrophy of the left ventricle. Normal global wall motion. Doppler evidence of grade II (pseudonormal) diastolic dysfunction, elevated LAP. Normal LV systolic function with visual EF 50-55%. Calculated EF 50%. IVC is not well visualized.  Lexiscan (Walking with mod Bruce)Tetrofosmin Stress Test  09/02/2019: Nondiagnostic ECG stress. The heart rate response was accelerated. The baseline blood pressure was 200/116 mmHg and increased to 230/120 mmHg at peak infusion.  Resting EKG/ECG demonstrated normal sinus rhythm. Peak EKG/ECG revealed non-specific ST-T abnormality. The LV is mildly dilated both in rest and stress images.  Normal myocardial perfusion. Mild global hypokinesis. Stress LV EF is mildly dysfunctional 42%.  Findings consistent with non-ischemic cardiomyopathy.  No previous exam available for comparison. Intermediate risk study.  Lower Extremity Venous Duplex  10/29/2019: RIGHT: - No indirect evidence of obstruction proximal to the inguinal ligament. LEFT: - There is no evidence of deep vein thrombosis in the lower extremity.  EKG:   EKG 06/04/2020: Normal sinus rhythm at rate of 98 bpm, left axis deviation, left anterior fascicular block.   Poor R wave progression, cannot exclude anterolateral infarct old.  Diffuse nonspecific T abnormality.  Low-voltage complexes, pulmonary disease pattern.  Compared to 08/09/2019, low-voltage complexes are new.   Assessment     ICD-10-CM   1. Resistant hypertension  I10 EKG 12-Lead    labetalol (NORMODYNE) 100 MG tablet  2. Leg swelling  M79.89 furosemide (LASIX) 20 MG tablet   Meds ordered this encounter  Medications  . furosemide (LASIX) 20 MG tablet    Sig: Take 1 tablet (20 mg total) by mouth daily as needed.    Dispense:  30 tablet  . labetalol (NORMODYNE) 100 MG tablet    Sig: Take 0.5 tablets (50 mg total) by mouth 2 (two) times daily.    Dispense:  60 tablet    Refill:  1    Medications Discontinued During This Encounter  Medication Reason  . atorvastatin (LIPITOR) 10 MG tablet Patient Preference  . amLODipine (NORVASC) 5 MG tablet Discontinued by provider  . furosemide (LASIX) 20 MG tablet No longer needed (for PRN medications)  . hydrALAZINE (APRESOLINE) 25 MG tablet Discontinued by provider  . labetalol (NORMODYNE) 200 MG tablet   . isosorbide dinitrate (ISORDIL) 30 MG tablet Discontinued by provider     Recommendations:   Debra Jordan  is a 69 y.o.  with type 2 diabetes with stage 3 CKD and h/o left nephrectomy for pelvic kidney in 1995, hyperlipidemia, hypertension, hypothyroidism, history of migraines, depression and anxiety, mild cognitive impairment, history of gout,  chronic back pain, history of recurrent embolic events leading to toe ischemia in 2013 (left great toe and right 2nd toe) and has been on anticoagulation since, hypercoagulable work up negative, echo in 2013 with bubble study negative for shunt.  She is presently only on 2.5 mg of Xarelto every other day and has not had any recurrence of embolic complications, unable to tolerate greater than this dose due to nosebleeds.  1 month ago she had an episode of syncope related to low blood pressure, probably  contributed by UTI.  Also about 8 weeks ago she had coronavirus variant infection as well.  Suspect hypotension was related to infection,  she is presently finishing ciprofloxacin dosage in the next 2 to 3 days.  I am concerned that her blood pressure can potentially increase again hence I have restarted back labetalol but instead of 200 mg p.o. twice daily I will start her at 50 mg twice daily.  She has discontinued Imdur and also hydralazine.  I would like to monitor her closely in 6 weeks and if she remains stable I will see her back on a as needed basis.  She has lost about 10 pounds in weight since infections, advised her to continue to lose weight which is probably also help with blood pressure control.   Adrian Prows, MD, [facility] 06/04/2020, 12:31 PM Office: [phone]   CC: Donato Heinz, MD (Nephro); Shon Baton, [facility]

## 2020-07-20 ENCOUNTER — Other Ambulatory Visit: Payer: Self-pay

## 2020-07-20 ENCOUNTER — Ambulatory Visit: Payer: Medicare Other | Admitting: Cardiology

## 2020-07-20 ENCOUNTER — Encounter: Payer: Self-pay | Admitting: Cardiology

## 2020-07-20 VITALS — BP 140/71 | HR 80 | Resp 16 | Ht 64.0 in | Wt 238.0 lb

## 2020-07-20 DIAGNOSIS — E782 Mixed hyperlipidemia: Secondary | ICD-10-CM

## 2020-07-20 DIAGNOSIS — I10 Essential (primary) hypertension: Secondary | ICD-10-CM

## 2020-07-20 MED ORDER — EZETIMIBE 10 MG PO TABS: 10.0000 mg | ORAL_TABLET | Freq: Every day | ORAL | 2 refills | Status: DC

## 2020-07-20 MED ORDER — PITAVASTATIN CALCIUM 2 MG PO TABS: 1.0000 | ORAL_TABLET | Freq: Every day | ORAL | 2 refills | Status: DC

## 2020-07-20 MED ORDER — LABETALOL HCL 100 MG PO TABS: 100.0000 mg | ORAL_TABLET | Freq: Two times a day (BID) | ORAL | 3 refills | Status: DC

## 2020-07-20 NOTE — Progress Notes (Signed)
Primary Physician/Referring:  Shon Baton, MD  Patient ID: Debra Jordan, female    DOB: 19-Mar-1951, 69 y.o.   MRN: [id number]  Chief Complaint  Patient presents with  . Hypertension  . Follow-up   HPI:    HPI: Debra Jordan  is a 69 y.o. with type 2 diabetes with stage 3 CKD and h/o left nephrectomy for pelvic kidney in 1995,, hyperlipidemia with severe statin intolerance with myalgia and abdominal discomfort, hypertension, hypothyroidism, history of migraines, history of gout,  chronic back pain, history of recurrent embolic toe ischemia in 9622 (left great toe and right 2nd toe) and has been on low-dose Xarelto since and hypercoagulable work up negative, echo in 2013 with bubble study negative for shunt.    She now presents for a 6 weeks office visit for follow-up of hypertension.  She is tolerating all her medications well.  She was on labetalol 200 mg twice daily with excellent control of hypertension however patient had developed UTI and near syncope and the medication was discontinued.  She is now back on 50 mg twice daily and remains asymptomatic.  Past Medical History:  Diagnosis Date  . ADD (attention deficit disorder)   . Arthritis    gout  . Blue toe syndrome ([place]) 10/06/2011  . Broken shoulder    And tailbone   . Cataract    Mixed form OU  . Chronic kidney disease    hx of left nephrectomy  . Diabetes mellitus    type 2  . Diabetic retinopathy ([place])    NPDR OU  . DM w/o complication type II, uncontrolled 09/06/2011  . Fatty liver   . Fibromyalgia   . Gout 09/06/2011  . History of headache   . History of kidney stones   . History of nephrectomy, unilateral 09/06/2011  . History of PCOS   . History of venous thrombosis and embolism    Left great toe  . Hyperlipidemia   . Hypersomnolence   . Hypertension   . Hypertensive retinopathy    OU  . Hypothyroid 09/06/2011  . Hypothyroidism   . Ischemia of extremity 09/06/2011  . Migraine headache 09/06/2011  .  Neuromuscular disorder (HCC)    neuropathy to feet bilateral  . Obesity   . Seizure disorder ([place])   . Trouble swallowing    Past Surgical History:  Procedure Laterality Date  . ABDOMINAL HYSTERECTOMY    . HERNIA REPAIR    . LYMPH NODE BIOPSY    . NASAL SINUS SURGERY    . NEPHRECTOMY Left 2002   left nephrectomy  . TONSILLECTOMY  1969  . TUBAL LIGATION     Social History   Tobacco Use  . Smoking status: Never Smoker  . Smokeless tobacco: Never Used  Substance Use Topics  . Alcohol use: No  Marital Status: Widowed   ROS  Review of Systems  Constitutional: Negative for malaise/fatigue and weight gain.  Cardiovascular: Positive for dyspnea on exertion (improving with weight loss) and leg swelling (occasional). Negative for syncope.  Respiratory: Negative for hemoptysis.   Endocrine: Negative for cold intolerance.  Hematologic/Lymphatic: Does not bruise/bleed easily.  Musculoskeletal: Positive for arthritis.  Gastrointestinal: Negative for melena.  Neurological: Negative for headaches and light-headedness.  Psychiatric/Behavioral: Positive for depression and memory loss.   Objective  Blood pressure 140/71, pulse 80, resp. rate 16, height 5\' 4"  (1.626 m), weight 238 lb (108 kg), SpO2 96 %. Body mass index is 40.85 kg/m.   Vitals with BMI 07/20/2020  06/04/2020 12/16/2019  Height 5\' 4"  5\' 4"  5\' 4"   Weight 238 lbs 239 lbs 250 lbs 3 oz  BMI 24.09 41 73.53  Systolic 299 242 683  Diastolic 71 68 84  Pulse 80 90 72    Physical Exam Constitutional:      Comments: She is moderately built and morbidly obese in no acute distress.  HENT:     Head: Atraumatic.  Eyes:     Conjunctiva/sclera: Conjunctivae normal.  Neck:     Thyroid: No thyromegaly.  Cardiovascular:     Rate and Rhythm: Normal rate and regular rhythm.     Pulses: Intact distal pulses.     Heart sounds: Normal heart sounds. No murmur heard.  No gallop.      Comments: No leg edema. No JVD. Pulmonary:      Effort: Pulmonary effort is normal.     Breath sounds: Normal breath sounds.  Abdominal:     General: Bowel sounds are normal.     Palpations: Abdomen is soft.  Musculoskeletal:     Cervical back: Neck supple.  Neurological:     Mental Status: She is alert.    Radiology: No results found.  Laboratory examination:   Lipid Panel     Component Value Date/Time   CHOL 230 (H) 09/07/2011 0519   TRIG 401 (H) 09/07/2011 0519   HDL 39 (L) 09/07/2011 0519   CHOLHDL 5.9 09/07/2011 0519   VLDL UNABLE TO CALCULATE IF TRIGLYCERIDE OVER 400 mg/dL 09/07/2011 0519   LDLCALC UNABLE TO CALCULATE IF TRIGLYCERIDE OVER 400 mg/dL 09/07/2011 0519    External labs  Cholesterol, total 221.000 m 05/13/2019 HDL 38 MG/DL 05/13/2019 LDL 140.000 m 05/13/2019 Triglycerides 213.000 05/13/2019  A1C 7.900 % 08/01/2019. TSH 3.620 08/05/2019  Hemoglobin 13.100 g/ 08/05/2019 Platelets 215.000 X 11/07/2018  Creatinine, Serum 1.500 mg/ 08/05/2019 Potassium 4.500 MM 11/06/2015 ALT (SGPT) 32.000 uni 05/13/2019  Medications   Current Outpatient Medications on File Prior to Visit  Medication Sig Dispense Refill  . amLODipine (NORVASC) 5 MG tablet Take 5 mg by mouth at bedtime.     . Armodafinil 150 MG tablet Take 150 mg by mouth as directed. 1/2 tablet prn     . Cholecalciferol (VITAMIN D3 PO) Take by mouth daily.    . cyclobenzaprine (FLEXERIL) 10 MG tablet Take 10 mg by mouth at bedtime.     . DULoxetine (CYMBALTA) 60 MG capsule Take 60 mg by mouth daily.   0  . fenofibrate 160 MG tablet Take 1 tablet (160 mg total) by mouth daily.    . furosemide (LASIX) 20 MG tablet Take 1 tablet (20 mg total) by mouth daily as needed. 30 tablet   . insulin lispro (HUMALOG) 100 UNIT/ML injection Inject 3-15 Units into the skin 4 (four) times daily. Sliding scale    . Levothyroxine Sodium 100 MCG CAPS Take 100 mcg by mouth every morning.     Marland Kitchen losartan (COZAAR) 50 MG tablet Take 50 mg by mouth 2 (two) times daily.     .  Multiple Vitamin (MULTIVITAMIN) tablet Take 1 tablet by mouth daily.    . ondansetron (ZOFRAN) 4 MG tablet Take 4 mg by mouth 3 (three) times daily as needed.    . pantoprazole (PROTONIX) 40 MG tablet Take 40 mg by mouth daily.    . rivaroxaban (XARELTO) 2.5 MG TABS tablet Take 2.5 mg by mouth 2 (two) times daily. XARLETO     . albuterol (VENTOLIN HFA) 108 (90 Base) MCG/ACT inhaler  SMARTSIG:1-2 Puff(s) By Mouth Every 4 Hours PRN    . montelukast (SINGULAIR) 10 MG tablet Take 10 mg by mouth at bedtime.    Debra Jordan MAX SOLOSTAR 300 UNIT/ML Solostar Pen SMARTSIG:44 Unit(s) SUB-Q Twice Daily     No current facility-administered medications on file prior to visit.     Cardiac Studies:   MRA of the abdomen and bilateral lower extremity for 08/11/11: Normal lower extremity MRA, single right kidney, no evidence of renal artery stenosis.  No significant disease of the aorta.  Echocardiogram 2013:Bubble  study negative for to cardiac shunting, normal LV systolic function, grade 1 diastolic dysfunction.  Echocardiogram 09/06/2019:  Left ventricle cavity is normal in size. Moderate concentric hypertrophy of the left ventricle. Normal global wall motion. Doppler evidence of grade II (pseudonormal) diastolic dysfunction, elevated LAP. Normal LV systolic function with visual EF 50-55%. Calculated EF 50%. IVC is not well visualized.  Lexiscan (Walking with mod Bruce)Tetrofosmin Stress Test  09/02/2019: Nondiagnostic ECG stress. The heart rate response was accelerated. The baseline blood pressure was 200/116 mmHg and increased to 230/120 mmHg at peak infusion.  Resting EKG/ECG demonstrated normal sinus rhythm. Peak EKG/ECG revealed non-specific ST-T abnormality. The LV is mildly dilated both in rest and stress images.  Normal myocardial perfusion. Mild global hypokinesis. Stress LV EF is mildly dysfunctional 42%.  Findings consistent with non-ischemic cardiomyopathy.  No previous exam available for  comparison. Intermediate risk study.  Lower Extremity Venous Duplex  10/29/2019: RIGHT:  No indirect evidence of obstruction proximal to the inguinal ligament. LEFT:  There is no evidence of deep vein thrombosis in the lower extremity.  EKG:   EKG 06/04/2020: Normal sinus rhythm at rate of 98 bpm, left axis deviation, left anterior fascicular block.  Poor R wave progression, cannot exclude anterolateral infarct old.  Diffuse nonspecific T abnormality.  Low-voltage complexes, pulmonary disease pattern.  Compared to 08/09/2019, low-voltage complexes are new.   Assessment     ICD-10-CM   1. Primary hypertension  I10 labetalol (NORMODYNE) 100 MG tablet  2. Mixed hyperlipidemia  E78.2 Pitavastatin Calcium 2 MG TABS    ezetimibe (ZETIA) 10 MG tablet    Lipid Panel With LDL/HDL Ratio   Meds ordered this encounter  Medications  . Pitavastatin Calcium 2 MG TABS    Sig: Take 1 tablet (2 mg total) by mouth daily.    Dispense:  30 tablet    Refill:  2  . ezetimibe (ZETIA) 10 MG tablet    Sig: Take 1 tablet (10 mg total) by mouth daily after supper.    Dispense:  30 tablet    Refill:  2  . labetalol (NORMODYNE) 100 MG tablet    Sig: Take 1 tablet (100 mg total) by mouth 2 (two) times daily.    Dispense:  90 tablet    Refill:  3    Medications Discontinued During This Encounter  Medication Reason  . ciprofloxacin (CIPRO) 250 MG tablet Completed Course  . insulin glargine (LANTUS) 100 UNIT/ML injection Change in therapy  . labetalol (NORMODYNE) 100 MG tablet Reorder     Recommendations:   Debra Jordan  is a 69 y.o.  with type 2 diabetes with stage 3 CKD and h/o left nephrectomy for pelvic kidney in 1995,, hyperlipidemia with severe statin intolerance with myalgia and abdominal discomfort, hypertension, hypothyroidism, history of migraines, history of gout,  chronic back pain, history of recurrent embolic toe ischemia in 2683 (left great toe and right 2nd toe) and has been  on low-dose  Xarelto since and hypercoagulable work up negative, echo in 2013 with bubble study negative for shunt.    She now presents for a 6 weeks office visit for follow-up of hypertension.  Presently tolerating labetalol without hypotension, previously on 200 mg twice daily, we will increase 200 mg twice daily from present 50 mg twice daily dose.  Expect improvement in blood pressure.  She is also losing weight and has made significant lifestyle changes, dyspnea has improved, I am very pleased with her progress.  Her BMI has reduced from nearly 40 to present 38+.  Positive encouragement given.  She is diabetes, hypertension, obesity and also embolic and hypercoagulable state.  She would be at extreme high risk for future cardiovascular events and has not been able to tolerate statins.  It was more than 10 to 15 years ago she had tried any other statins, will try Livalo 2 mg daily and if she tolerates this for 2 weeks, she will also add Zetia 10 mg daily.  We could consider discontinuing fenofibric acid.  I will repeat lipids in 6 weeks and see her back in 2 months both for hypertension and hyperlipidemia.  If she remains stable I will see her back on a as needed basis.   Adrian Prows, MD, [facility] 07/20/2020, 11:55 AM Office: [phone]

## 2020-08-06 ENCOUNTER — Ambulatory Visit: Payer: Self-pay

## 2020-08-06 ENCOUNTER — Other Ambulatory Visit: Payer: Self-pay

## 2020-08-06 ENCOUNTER — Ambulatory Visit: Payer: Medicare Other | Admitting: Orthopaedic Surgery

## 2020-08-06 ENCOUNTER — Encounter: Payer: Self-pay | Admitting: Orthopaedic Surgery

## 2020-08-06 VITALS — Ht 64.5 in | Wt 237.0 lb

## 2020-08-06 DIAGNOSIS — M19041 Primary osteoarthritis, right hand: Secondary | ICD-10-CM

## 2020-08-06 DIAGNOSIS — M25511 Pain in right shoulder: Secondary | ICD-10-CM

## 2020-08-06 DIAGNOSIS — M19049 Primary osteoarthritis, unspecified hand: Secondary | ICD-10-CM

## 2020-08-06 DIAGNOSIS — M79642 Pain in left hand: Secondary | ICD-10-CM | POA: Diagnosis not present

## 2020-08-06 DIAGNOSIS — M79641 Pain in right hand: Secondary | ICD-10-CM

## 2020-08-06 DIAGNOSIS — M19042 Primary osteoarthritis, left hand: Secondary | ICD-10-CM

## 2020-08-06 DIAGNOSIS — G8929 Other chronic pain: Secondary | ICD-10-CM | POA: Diagnosis not present

## 2020-08-06 HISTORY — DX: Pain in right shoulder: M25.511

## 2020-08-06 HISTORY — DX: Primary osteoarthritis, unspecified hand: M19.049

## 2020-08-06 NOTE — Progress Notes (Signed)
Office Visit Note   Patient: Debra Jordan           Date of Birth: 1951-05-26           MRN: [id number] Visit Date: 08/06/2020              Requested by: Shon Baton, [address],  [place] [address] PCP: Shon Baton, MD   Assessment & Plan: Visit Diagnoses:  1. Chronic right shoulder pain   2. Bilateral hand pain   3. Primary osteoarthritis of both hands     Plan: To arthritis both hands primarily affecting DIP and PIP joints.  Able to make a full fist and release.  Little bit of arthritis at the base of both thumbs with slight subluxation which does weaken her grip.  Neurologically intact.  Long discussion regarding treatment options including exercises, use of Voltaren gel and what she may expect over time.  Might have very minimal carpal tunnel on the right side which at this point is not functionally limiting.  Right shoulder impingement but with excellent range of motion.  Does have some degenerative change at the [facility] joint and some ectopic calcification over the supraspinatus attachment at the greater tuberosity.  No specific treatment at this point as she was minimally symptomatic.  She is diabetic and wants to limit cortisone.  We have discussed exercises, Voltaren gel and NSAIDs as needed.  She only has 1 kidney and will have to be very careful with the use of the NSAIDs and to check with her primary care physician before using any of the oral meds.  Follow-Up Instructions: Return if symptoms worsen or fail to improve.   Orders:  Orders Placed This Encounter  Procedures  . XR Shoulder Right  . XR Hand Complete Left  . XR Hand Complete Right   No orders of the defined types were placed in this encounter.     Procedures: No procedures performed   Clinical Data: No additional findings.   Subjective: Chief Complaint  Patient presents with  . Right Shoulder - Pain  . Right Hand - Pain  . Left Hand - Pain  Patient presents today for right shoulder  and bilateral hand pain. She said that she fractured her right shoulder in 2000, and has had pain on and off since. This year she fractured her clavicle as well on the right side. She said that she shoulder hurts all throughout. Her pain is worse with range of motion. She also has complaints of bilateral hand pain. She has knots on the joints of her hands that are tender. She is diabetic and has one kidney.  HPI  Review of Systems   Objective: Vital Signs: Ht 5' 4.5" (1.638 m)   Wt 237 lb (107.5 kg)   BMI 40.05 kg/m   Physical Exam Constitutional:      Appearance: She is well-developed and well-nourished.  HENT:     Mouth/Throat:     Mouth: Oropharynx is clear and moist.  Eyes:     Extraocular Movements: EOM normal.     Pupils: Pupils are equal, round, and reactive to light.  Pulmonary:     Effort: Pulmonary effort is normal.  Skin:    General: Skin is warm and dry.  Neurological:     Mental Status: She is alert and oriented to person, place, and time.  Psychiatric:        Mood and Affect: Mood and affect normal.  Behavior: Behavior normal.     Ortho Exam awake alert and oriented x3.  Comfortable sitting.  Some degenerative changes at the DIP and PIP joints of both hands with nodule formation but has excellent flexion and extension.  Neurologically intact.  Has had some tingling into the index and long finger but no Tinel's over the median nerve.  Minimal impingement symptoms right shoulder.  Able to place arm fully overhead and no limitation of internal and external rotation.  No crepitation  Specialty Comments:  No specialty comments available.  Imaging: XR Hand Complete Left  Result Date: 08/06/2020 Films of the left hand were obtained in several projections and compared to the right hand.  Findings are similar with degenerative changes at the DIP and PIP joints diffusely.  There is also some arthritis at the base of the thumb with slight subluxation.  No acute  changes  XR Hand Complete Right  Result Date: 08/06/2020 Films of the right hand were obtained in several projections.  There are degenerative changes diffusely at the DIP and PIP joints consistent with primary osteoarthritis.  There is also some degenerative change at the base of the thumb carpometacarpal joint with slight subluxation.  No acute changes  XR Shoulder Right  Result Date: 08/06/2020 Films of the right shoulder obtained in 3 projections.  There is diffuse degenerative change at the [facility] joint with some bulky osteophytes.  Normal space between the humeral head and the acromium.  Humeral head is centered about the glenoid.  There is a very small area of ectopic calcification near the greater tuberosity consistent with calcific tendinitis.  No acute changes.  No obvious degenerative change at the glenohumeral joint    PMFS History: Patient Active Problem List   Diagnosis Date Noted  . Pain in right shoulder 08/06/2020  . Osteoarthritis, hand 08/06/2020  . Blue toe syndrome ([place]) 10/06/2011  . DM (diabetes mellitus), type 2 with peripheral vascular complications ([place]) 37/85/8850  . HTN (hypertension) 09/06/2011  . Hypertriglyceridemia 09/06/2011  . Ischemia of extremity 09/06/2011  . Hypothyroid 09/06/2011  . Gout 09/06/2011  . Migraine headache 09/06/2011  . History of nephrectomy, unilateral 09/06/2011  . DM w/o complication type II, uncontrolled 09/06/2011  . Pain in toe of right foot 09/05/2011   Past Medical History:  Diagnosis Date  . ADD (attention deficit disorder)   . Arthritis    gout  . Blue toe syndrome ([place]) 10/06/2011  . Broken shoulder    And tailbone   . Cataract    Mixed form OU  . Chronic kidney disease    hx of left nephrectomy  . Diabetes mellitus    type 2  . Diabetic retinopathy ([place])    NPDR OU  . DM w/o complication type II, uncontrolled 09/06/2011  . Fatty liver   . Fibromyalgia   . Gout 09/06/2011  . History of headache   . History of  kidney stones   . History of nephrectomy, unilateral 09/06/2011  . History of PCOS   . History of venous thrombosis and embolism    Left great toe  . Hyperlipidemia   . Hypersomnolence   . Hypertension   . Hypertensive retinopathy    OU  . Hypothyroid 09/06/2011  . Hypothyroidism   . Ischemia of extremity 09/06/2011  . Migraine headache 09/06/2011  . Neuromuscular disorder (HCC)    neuropathy to feet bilateral  . Obesity   . Seizure disorder ([place])   . Trouble swallowing     Family  History  Problem Relation Age of Onset  . Other Mother        blood clot history, varicose veins  . Stroke Mother   . Diabetes Mother   . Cancer Father   . Stroke Maternal Grandmother   . Healthy Brother   . Healthy Brother   . Healthy Brother   . Diabetes Daughter     Past Surgical History:  Procedure Laterality Date  . ABDOMINAL HYSTERECTOMY    . HERNIA REPAIR    . LYMPH NODE BIOPSY    . NASAL SINUS SURGERY    . NEPHRECTOMY Left 2002   left nephrectomy  . TONSILLECTOMY  1969  . TUBAL LIGATION     Social History   Occupational History    Comment: [employer]  Tobacco Use  . Smoking status: Never Smoker  . Smokeless tobacco: Never Used  Vaping Use  . Vaping Use: Never used  Substance and Sexual Activity  . Alcohol use: No  . Drug use: No  . Sexual activity: Not Currently    Birth control/protection: Post-menopausal

## 2020-09-01 NOTE — Progress Notes (Signed)
[facility] Note  09/02/2020     CHIEF COMPLAINT Patient presents for Retina Follow Up   HISTORY OF PRESENT ILLNESS: Debra Jordan is a 70 y.o. female who presents to the clinic today for:   HPI    Retina Follow Up    Patient presents with  Diabetic Retinopathy.  In both eyes.  This started 4 months ago.  I, the attending physician,  performed the HPI with the patient and updated documentation appropriately.          Comments    Patient here for 4 months Retina follow up for NPDR OU. Patient states vision is up and down. It it issues with blood sugar. One day sees great then the next day vision blurry and watery. Blood sugar noon was 116 before ate. No eye pain.        Last edited by Bernarda Caffey, MD on 09/03/2020  9:13 PM. (History)    Patient states she doesn't feel like her vision has changed, but ever since she had covid, her blood sugars have been fluctuating    Referring physician: Shon Baton, MD [place],  [place] [id number]  HISTORICAL INFORMATION:   Selected notes from the MEDICAL RECORD NUMBER Referral from Dr. Parke Simmers for eval of NPDR w/DME OU. VA cc: OD: 20/30- OS: 20/50 Wearing Rx: OD: +1.50+0.25x1 OS: +1.00+0.25x102 MRx: OD: +1.25+1.25x180 20/25 OS: +1.25 sph 20/40 IOPS 19,17 Mixed form cataract OU, Htn Ret OU    CURRENT MEDICATIONS: No current outpatient medications on file. (Ophthalmic Drugs)   No current facility-administered medications for this visit. (Ophthalmic Drugs)   Current Outpatient Medications (Other)  Medication Sig  . albuterol (VENTOLIN HFA) 108 (90 Base) MCG/ACT inhaler SMARTSIG:1-2 Puff(s) By Mouth Every 4 Hours PRN  . amLODipine (NORVASC) 5 MG tablet Take 5 mg by mouth at bedtime.   . Armodafinil 150 MG tablet Take 150 mg by mouth as directed. 1/2 tablet prn  . Cholecalciferol (VITAMIN D3 PO) Take by mouth daily.  . cyclobenzaprine (FLEXERIL) 10 MG tablet Take 10 mg by mouth at bedtime.    . DULoxetine (CYMBALTA) 60 MG capsule Take 60 mg by mouth daily.   Marland Kitchen ezetimibe (ZETIA) 10 MG tablet Take 1 tablet (10 mg total) by mouth daily after supper.  . fenofibrate 160 MG tablet Take 1 tablet (160 mg total) by mouth daily.  . furosemide (LASIX) 20 MG tablet Take 1 tablet (20 mg total) by mouth daily as needed.  . insulin lispro (HUMALOG) 100 UNIT/ML injection Inject 3-15 Units into the skin 4 (four) times daily. Sliding scale  . labetalol (NORMODYNE) 100 MG tablet Take 1 tablet (100 mg total) by mouth 2 (two) times daily.  . Levothyroxine Sodium 100 MCG CAPS Take 100 mcg by mouth every morning.   Marland Kitchen losartan (COZAAR) 50 MG tablet Take 50 mg by mouth 2 (two) times daily.   . montelukast (SINGULAIR) 10 MG tablet Take 10 mg by mouth at bedtime.  . Multiple Vitamin (MULTIVITAMIN) tablet Take 1 tablet by mouth daily.  . ondansetron (ZOFRAN) 4 MG tablet Take 4 mg by mouth 3 (three) times daily as needed.  . pantoprazole (PROTONIX) 40 MG tablet Take 40 mg by mouth daily.  . Pitavastatin Calcium 2 MG TABS Take 1 tablet (2 mg total) by mouth daily.  . rivaroxaban (XARELTO) 2.5 MG TABS tablet Take 2.5 mg by mouth 2 (two) times daily. XARLETO  . TOUJEO MAX SOLOSTAR 300 UNIT/ML Solostar  Pen SMARTSIG:44 Unit(s) SUB-Q Twice Daily   No current facility-administered medications for this visit. (Other)      REVIEW OF SYSTEMS: ROS    Positive for: Endocrine, Cardiovascular, Eyes   Negative for: Constitutional, Gastrointestinal, Neurological, Skin, Genitourinary, Musculoskeletal, HENT, Respiratory, Psychiatric, Allergic/Imm, Heme/Lymph   Last edited by Theodore Demark, COA on 09/02/2020  1:14 PM. (History)       ALLERGIES Allergies  Allergen Reactions  . Statins Other (See Comments)    Severe myalgia and abdominal cramps  . Norvasc [Amlodipine]     Difficulty breathing   . Penicillins   . Sulfa Antibiotics Nausea Only  . Latex Rash    Occasional SOB    PAST MEDICAL HISTORY Past  Medical History:  Diagnosis Date  . ADD (attention deficit disorder)   . Arthritis    gout  . Blue toe syndrome ([place]) 10/06/2011  . Broken shoulder    And tailbone   . Cataract    Mixed form OU  . Chronic kidney disease    hx of left nephrectomy  . Diabetes mellitus    type 2  . Diabetic retinopathy ([place])    NPDR OU  . DM w/o complication type II, uncontrolled 09/06/2011  . Fatty liver   . Fibromyalgia   . Gout 09/06/2011  . History of headache   . History of kidney stones   . History of nephrectomy, unilateral 09/06/2011  . History of PCOS   . History of venous thrombosis and embolism    Left great toe  . Hyperlipidemia   . Hypersomnolence   . Hypertension   . Hypertensive retinopathy    OU  . Hypothyroid 09/06/2011  . Hypothyroidism   . Ischemia of extremity 09/06/2011  . Migraine headache 09/06/2011  . Neuromuscular disorder (HCC)    neuropathy to feet bilateral  . Obesity   . Seizure disorder ([place])   . Trouble swallowing    Past Surgical History:  Procedure Laterality Date  . ABDOMINAL HYSTERECTOMY    . HERNIA REPAIR    . LYMPH NODE BIOPSY    . NASAL SINUS SURGERY    . NEPHRECTOMY Left 2002   left nephrectomy  . TONSILLECTOMY  1969  . TUBAL LIGATION      FAMILY HISTORY Family History  Problem Relation Age of Onset  . Other Mother        blood clot history, varicose veins  . Stroke Mother   . Diabetes Mother   . Cancer Father   . Stroke Maternal Grandmother   . Healthy Brother   . Healthy Brother   . Healthy Brother   . Diabetes Daughter     SOCIAL HISTORY Social History   Tobacco Use  . Smoking status: Never Smoker  . Smokeless tobacco: Never Used  Vaping Use  . Vaping Use: Never used  Substance Use Topics  . Alcohol use: No  . Drug use: No         OPHTHALMIC EXAM:  Base Eye Exam    Visual Acuity (Snellen - Linear)      Right Left   Dist [place] 20/30 -1 20/30 -1   Dist ph [place] 20/20 -1 20/20       Tonometry (Tonopen, 1:10 PM)       Right Left   Pressure 14 13       Pupils      Dark Light Shape React APD   Right 3 2 Round Brisk None   Left 3 2 Round  Brisk None       Visual Fields (Counting fingers)      Left Right    Full Full       Extraocular Movement      Right Left    Full Full       Neuro/Psych    Oriented x3: Yes   Mood/Affect: Normal       Dilation    Both eyes: 1.0% Mydriacyl, 2.5% Phenylephrine @ 1:10 PM        Slit Lamp and Fundus Exam    Slit Lamp Exam      Right Left   Lids/Lashes Dermatochalasis - upper lid, mild Meibomian gland dysfunction Dermatochalasis - upper lid, mild Meibomian gland dysfunction   Conjunctiva/Sclera White and quiet White and quiet   Cornea trace inferior Punctate epithelial erosions trace inferior Punctate epithelial erosions   Anterior Chamber Deep and quiet, narrow temporal angle Deep and quiet, narrow temporal angle   Iris Round and dilated, mild anterior bowing Round and dilated, mild anterior bowing   Lens 2-3+ Nuclear sclerosis, 2-3+ Cortical cataract 2-3+ Nuclear sclerosis, 2-3+ Cortical cataract   Vitreous Vitreous syneresis, Posterior vitreous detachment Vitreous syneresis       Fundus Exam      Right Left   Disc Pink and Sharp, Compact, mild temporal PPA Pink and Sharp, mild tilt   C/D Ratio 0.4 0.4   Macula Flat, Good foveal reflex, mild Retinal pigment epithelial mottling, scattered Microaneurysms/DBH -- slightly improved from prior, trace, persistent cystic changes Blunted foveal reflex, scattered MA/IRH/cystic changes   Vessels Vascular attenuation, mild Tortuousity Vascular attenuation, Tortuous   Periphery Attached, scattered DBH greatest posteriorly    Attached, pigmented cystoid degeneration inferiorly, scattered MA greatest posteriorly, focal DBH IN to disc          IMAGING AND PROCEDURES  Imaging and Procedures for _0 @  OCT, Retina - OU - Both Eyes       Right Eye Quality was good. Central Foveal Thickness: 239. Progression  has been stable. Findings include normal foveal contour, no SRF, intraretinal fluid (Partial PVD, persistent cystic changes temporal macula ).   Left Eye Quality was good. Central Foveal Thickness: 247. Progression has improved. Findings include normal foveal contour, no SRF, vitreomacular adhesion , intraretinal fluid (Interval improvement in cystic changes temporal macula -- best seen on widefiled).   Notes *Images captured and stored on drive  Diagnosis / Impression:  NFP, no SRF OU OD: persistent cystic changes temporal macula -- mild OS: interval improvement in cystic changes temporal macula -- best seen on widefiled  Clinical management:  See below  Abbreviations: NFP - Normal foveal profile. CME - cystoid macular edema. PED - pigment epithelial detachment. IRF - intraretinal fluid. SRF - subretinal fluid. EZ - ellipsoid zone. ERM - epiretinal membrane. ORA - outer retinal atrophy. ORT - outer retinal tubulation. SRHM - subretinal hyper-reflective material                 ASSESSMENT/PLAN:    ICD-10-CM   1. Moderate nonproliferative diabetic retinopathy of both eyes with macular edema associated with type 2 diabetes mellitus ([place])  H47.6546   2. Retinal edema  H35.81 OCT, Retina - OU - Both Eyes  3. Essential hypertension  I10   4. Hypertensive retinopathy of both eyes  H35.033   5. Combined forms of age-related cataract of both eyes  H25.813     1,2. Moderate Non-proliferative diabetic retinopathy w/ DME OU  - A1c improved to 6.2  from 8 per pt report  - exam shows scattered MA and IRH OU; no NV  - FA (04.20.21) w/ leaking MA OU, no NV  - BCVA 20/20 OU  - OCT shows [id number] cystic changes temporal macula -- mild; OS: interval improvement in cystic changes temporal macula -- best seen on widefiled  - pt reports recent stressors have impacted BG control -- pt has had Covid twice  - no intervention recommended -- continue monitoring  - f/u in 4 months, sooner prn  -- DFE, OCT  4,5. Hypertensive retinopathy OU  - discussed importance of tight BP control  - monitor  6. Mixed cataract OU  - The symptoms of cataract, surgical options, and treatments and risks were discussed with patient.  - discussed diagnosis and progression  - not yet visually significant  - monitor for now   Ophthalmic Meds Ordered this visit:  No orders of the defined types were placed in this encounter.      Return in about 4 months (around 12/31/2020) for f/u NPDR OU, DFE, OCT.  There are no Patient Instructions on file for this visit.   Explained the diagnoses, plan, and follow up with the patient and they expressed understanding.  Patient expressed understanding of the importance of proper follow up care.   This document serves as a record of services personally performed by Gardiner Sleeper, MD, PhD. It was created on their behalf by Roselee Nova, COMT. The creation of this record is the provider's dictation and/or activities during the visit.  Electronically signed by: Roselee Nova, COMT 09/03/20 9:18 PM   This document serves as a record of services personally performed by Gardiner Sleeper, MD, PhD. It was created on their behalf by San Jetty. Owens Shark, OA an ophthalmic technician. The creation of this record is the provider's dictation and/or activities during the visit.    Electronically signed by: San Jetty. Owens Shark, [place] 01.12.2022 9:18 PM  Gardiner Sleeper, M.D., Ph.D. Diseases & Surgery of the Retina and Vitreous [facility]  I have reviewed the above documentation for accuracy and completeness, and I agree with the above. Gardiner Sleeper, M.D., Ph.D. 09/03/20 9:18 PM   Abbreviations: M myopia (nearsighted); A astigmatism; H hyperopia (farsighted); P presbyopia; Mrx spectacle prescription;  CTL contact lenses; OD right eye; OS left eye; OU both eyes  XT exotropia; ET esotropia; PEK punctate epithelial keratitis; PEE punctate epithelial erosions;  DES dry eye syndrome; MGD meibomian gland dysfunction; ATs artificial tears; PFAT's preservative free artificial tears; [place] nuclear sclerotic cataract; PSC posterior subcapsular cataract; ERM epi-retinal membrane; PVD posterior vitreous detachment; RD retinal detachment; DM diabetes mellitus; DR diabetic retinopathy; NPDR non-proliferative diabetic retinopathy; PDR proliferative diabetic retinopathy; CSME clinically significant macular edema; DME diabetic macular edema; dbh dot blot hemorrhages; CWS cotton wool spot; POAG primary open angle glaucoma; C/D cup-to-disc ratio; HVF humphrey visual field; GVF goldmann visual field; OCT optical coherence tomography; IOP intraocular pressure; BRVO Branch retinal vein occlusion; CRVO central retinal vein occlusion; CRAO central retinal artery occlusion; BRAO branch retinal artery occlusion; RT retinal tear; SB scleral buckle; PPV pars plana vitrectomy; VH Vitreous hemorrhage; PRP panretinal laser photocoagulation; IVK intravitreal kenalog; VMT vitreomacular traction; MH Macular hole;  NVD neovascularization of the disc; NVE neovascularization elsewhere; AREDS age related eye disease study; ARMD age related macular degeneration; POAG primary open angle glaucoma; EBMD epithelial/anterior basement membrane dystrophy; ACIOL anterior chamber intraocular lens; IOL intraocular lens; PCIOL posterior chamber intraocular lens; Phaco/IOL phacoemulsification with  intraocular lens placement; [place] photorefractive keratectomy; LASIK laser assisted in situ keratomileusis; HTN hypertension; DM diabetes mellitus; COPD chronic obstructive pulmonary disease

## 2020-09-02 ENCOUNTER — Other Ambulatory Visit: Payer: Self-pay

## 2020-09-02 ENCOUNTER — Encounter (INDEPENDENT_AMBULATORY_CARE_PROVIDER_SITE_OTHER): Payer: Self-pay | Admitting: Ophthalmology

## 2020-09-02 ENCOUNTER — Ambulatory Visit (INDEPENDENT_AMBULATORY_CARE_PROVIDER_SITE_OTHER): Payer: Medicare Other | Admitting: Ophthalmology

## 2020-09-02 DIAGNOSIS — H25813 Combined forms of age-related cataract, bilateral: Secondary | ICD-10-CM

## 2020-09-02 DIAGNOSIS — I10 Essential (primary) hypertension: Secondary | ICD-10-CM

## 2020-09-02 DIAGNOSIS — E113313 Type 2 diabetes mellitus with moderate nonproliferative diabetic retinopathy with macular edema, bilateral: Secondary | ICD-10-CM

## 2020-09-02 DIAGNOSIS — H35033 Hypertensive retinopathy, bilateral: Secondary | ICD-10-CM

## 2020-09-02 DIAGNOSIS — H3581 Retinal edema: Secondary | ICD-10-CM | POA: Diagnosis not present

## 2020-09-03 ENCOUNTER — Encounter (INDEPENDENT_AMBULATORY_CARE_PROVIDER_SITE_OTHER): Payer: Self-pay | Admitting: Ophthalmology

## 2020-09-09 ENCOUNTER — Ambulatory Visit: Payer: Medicare Other | Admitting: Cardiology

## 2020-09-14 ENCOUNTER — Ambulatory Visit: Payer: Medicare Other | Admitting: Cardiology

## 2020-12-25 ENCOUNTER — Emergency Department (HOSPITAL_BASED_OUTPATIENT_CLINIC_OR_DEPARTMENT_OTHER): Payer: Medicare Other

## 2020-12-25 ENCOUNTER — Other Ambulatory Visit: Payer: Self-pay

## 2020-12-25 ENCOUNTER — Emergency Department (HOSPITAL_BASED_OUTPATIENT_CLINIC_OR_DEPARTMENT_OTHER)
Admission: EM | Admit: 2020-12-25 | Discharge: 2020-12-25 | Disposition: A | Discharge status: Home / Self Care | Payer: Medicare Other | Attending: Emergency Medicine | Admitting: Emergency Medicine

## 2020-12-25 ENCOUNTER — Encounter (HOSPITAL_BASED_OUTPATIENT_CLINIC_OR_DEPARTMENT_OTHER): Payer: Self-pay | Admitting: Emergency Medicine

## 2020-12-25 DIAGNOSIS — Z79899 Other long term (current) drug therapy: Secondary | ICD-10-CM | POA: Diagnosis not present

## 2020-12-25 DIAGNOSIS — I129 Hypertensive chronic kidney disease with stage 1 through stage 4 chronic kidney disease, or unspecified chronic kidney disease: Secondary | ICD-10-CM | POA: Insufficient documentation

## 2020-12-25 DIAGNOSIS — E1122 Type 2 diabetes mellitus with diabetic chronic kidney disease: Secondary | ICD-10-CM | POA: Insufficient documentation

## 2020-12-25 DIAGNOSIS — E11319 Type 2 diabetes mellitus with unspecified diabetic retinopathy without macular edema: Secondary | ICD-10-CM | POA: Diagnosis not present

## 2020-12-25 DIAGNOSIS — Z7901 Long term (current) use of anticoagulants: Secondary | ICD-10-CM | POA: Diagnosis not present

## 2020-12-25 DIAGNOSIS — N189 Chronic kidney disease, unspecified: Secondary | ICD-10-CM | POA: Insufficient documentation

## 2020-12-25 DIAGNOSIS — M25511 Pain in right shoulder: Secondary | ICD-10-CM

## 2020-12-25 DIAGNOSIS — E039 Hypothyroidism, unspecified: Secondary | ICD-10-CM | POA: Insufficient documentation

## 2020-12-25 DIAGNOSIS — Z91040 Latex allergy status: Secondary | ICD-10-CM | POA: Insufficient documentation

## 2020-12-25 DIAGNOSIS — M25551 Pain in right hip: Secondary | ICD-10-CM | POA: Diagnosis not present

## 2020-12-25 DIAGNOSIS — W01198A Fall on same level from slipping, tripping and stumbling with subsequent striking against other object, initial encounter: Secondary | ICD-10-CM | POA: Insufficient documentation

## 2020-12-25 DIAGNOSIS — Z794 Long term (current) use of insulin: Secondary | ICD-10-CM | POA: Insufficient documentation

## 2020-12-25 DIAGNOSIS — G44309 Post-traumatic headache, unspecified, not intractable: Secondary | ICD-10-CM | POA: Diagnosis not present

## 2020-12-25 DIAGNOSIS — R110 Nausea: Secondary | ICD-10-CM | POA: Diagnosis not present

## 2020-12-25 DIAGNOSIS — G44319 Acute post-traumatic headache, not intractable: Secondary | ICD-10-CM

## 2020-12-25 DIAGNOSIS — R5383 Other fatigue: Secondary | ICD-10-CM | POA: Diagnosis not present

## 2020-12-25 DIAGNOSIS — W19XXXA Unspecified fall, initial encounter: Secondary | ICD-10-CM

## 2020-12-25 NOTE — ED Notes (Signed)
Back from imaging.

## 2020-12-25 NOTE — ED Triage Notes (Signed)
Fall 3 -4 weeks, head injury back then. Nausea headache  and dizziness x 3 weeks . On anticoagulants. Did not seek medical attention when injury occurred.

## 2020-12-25 NOTE — Discharge Instructions (Addendum)
You were seen in the emergency department for evaluation of injuries from a fall.  You had a CAT scan of your head along with x-rays of your right shoulder and right hip that did not show any acute traumatic findings.  Please contact your primary care doctor for close follow-up.

## 2020-12-25 NOTE — ED Provider Notes (Signed)
[facility] DE[facility] Provider Note   CSN: [id number] Arrival date & time: 12/25/20  1112     History Chief Complaint  Patient presents with  . Headache    Debra Jordan is a 70 y.o. female.  She is on Xarelto for DVT PE.  She had a fall while standing on a table changing the ceiling fan direction.  She struck her head and right shoulder right hip still hurt.  This was 3 weeks ago.  She continues to have occipital headaches dizziness and nausea.  Her PCP recommended that she come to the ER but she was afraid to be around ill people.  The history is provided by the patient.  Headache Pain location:  Occipital Quality:  Dull Radiates to:  Does not radiate Severity currently:  4/10 Severity at highest:  4/10 Onset quality:  Sudden Duration:  3 weeks Timing:  Intermittent Progression:  Unchanged Chronicity:  New Relieved by:  Nothing Exacerbated by: leaning forward. Ineffective treatments:  Acetaminophen Associated symptoms: dizziness (since covid), fatigue (since covid) and nausea   Associated symptoms: no abdominal pain, no back pain, no cough, no fever, no focal weakness, no neck pain, no sore throat and no vomiting        Past Medical History:  Diagnosis Date  . ADD (attention deficit disorder)   . Arthritis    gout  . Blue toe syndrome ([place]) 10/06/2011  . Broken shoulder    And tailbone   . Cataract    Mixed form OU  . Chronic kidney disease    hx of left nephrectomy  . Diabetes mellitus    type 2  . Diabetic retinopathy ([place])    NPDR OU  . DM w/o complication type II, uncontrolled 09/06/2011  . Fatty liver   . Fibromyalgia   . Gout 09/06/2011  . History of headache   . History of kidney stones   . History of nephrectomy, unilateral 09/06/2011  . History of PCOS   . History of venous thrombosis and embolism    Left great toe  . Hyperlipidemia   . Hypersomnolence   . Hypertension   . Hypertensive retinopathy    OU  . Hypothyroid  09/06/2011  . Hypothyroidism   . Ischemia of extremity 09/06/2011  . Migraine headache 09/06/2011  . Neuromuscular disorder ([facility])    neuropathy to feet bilateral  . Obesity   . Seizure disorder ([place])   . Trouble swallowing     Patient Active Problem List   Diagnosis Date Noted  . Pain in right shoulder 08/06/2020  . Osteoarthritis, hand 08/06/2020  . Blue toe syndrome ([place]) 10/06/2011  . DM (diabetes mellitus), type 2 with peripheral vascular complications ([place]) 69/62/9528  . HTN (hypertension) 09/06/2011  . Hypertriglyceridemia 09/06/2011  . Ischemia of extremity 09/06/2011  . Hypothyroid 09/06/2011  . Gout 09/06/2011  . Migraine headache 09/06/2011  . History of nephrectomy, unilateral 09/06/2011  . DM w/o complication type II, uncontrolled 09/06/2011  . Pain in toe of right foot 09/05/2011    Past Surgical History:  Procedure Laterality Date  . ABDOMINAL HYSTERECTOMY    . HERNIA REPAIR    . LYMPH NODE BIOPSY    . NASAL SINUS SURGERY    . NEPHRECTOMY Left 2002   left nephrectomy  . TONSILLECTOMY  1969  . TUBAL LIGATION       OB History   No obstetric history on file.     Family History  Problem Relation Age of Onset  .  Other Mother        blood clot history, varicose veins  . Stroke Mother   . Diabetes Mother   . Cancer Father   . Stroke Maternal Grandmother   . Healthy Brother   . Healthy Brother   . Healthy Brother   . Diabetes Daughter     Social History   Tobacco Use  . Smoking status: Never Smoker  . Smokeless tobacco: Never Used  Vaping Use  . Vaping Use: Never used  Substance Use Topics  . Alcohol use: No  . Drug use: No    Home Medications Prior to Admission medications   Medication Sig Start Date End Date Taking? Authorizing Provider  albuterol (VENTOLIN HFA) 108 (90 Base) MCG/ACT inhaler SMARTSIG:1-2 Puff(s) By Mouth Every 4 Hours PRN 07/10/20   [provider]  amLODipine (NORVASC) 5 MG tablet Take 5 mg by mouth at bedtime.   01/22/20   [provider]  Armodafinil 150 MG tablet Take 150 mg by mouth as directed. 1/2 tablet prn    [provider]  Cholecalciferol (VITAMIN D3 PO) Take by mouth daily.    [provider]  cyclobenzaprine (FLEXERIL) 10 MG tablet Take 10 mg by mouth at bedtime.     [provider]  DULoxetine (CYMBALTA) 60 MG capsule Take 60 mg by mouth daily.  03/09/18   [provider]  ezetimibe (ZETIA) 10 MG tablet Take 1 tablet (10 mg total) by mouth daily after supper. 07/20/20 10/18/20  Adrian Prows, MD  fenofibrate 160 MG tablet Take 1 tablet (160 mg total) by mouth daily. 09/08/11 07/20/20  Shon Baton, MD  furosemide (LASIX) 20 MG tablet Take 1 tablet (20 mg total) by mouth daily as needed. 06/04/20   Adrian Prows, MD  insulin lispro (HUMALOG) 100 UNIT/ML injection Inject 3-15 Units into the skin 4 (four) times daily. Sliding scale    [provider]  labetalol (NORMODYNE) 100 MG tablet Take 1 tablet (100 mg total) by mouth 2 (two) times daily. 07/20/20   Adrian Prows, MD  Levothyroxine Sodium 100 MCG CAPS Take 100 mcg by mouth every morning.     [provider]  losartan (COZAAR) 50 MG tablet Take 50 mg by mouth 2 (two) times daily.  08/04/14   [provider]  montelukast (SINGULAIR) 10 MG tablet Take 10 mg by mouth at bedtime. 07/10/20   [provider]  Multiple Vitamin (MULTIVITAMIN) tablet Take 1 tablet by mouth daily.    [provider]  ondansetron (ZOFRAN) 4 MG tablet Take 4 mg by mouth 3 (three) times daily as needed. 01/30/20   [provider]  pantoprazole (PROTONIX) 40 MG tablet Take 40 mg by mouth daily.    [provider]  Pitavastatin Calcium 2 MG TABS Take 1 tablet (2 mg total) by mouth daily. 07/20/20   Adrian Prows, MD  rivaroxaban (XARELTO) 2.5 MG TABS tablet Take 2.5 mg by mouth 2 (two) times daily. XARLETO 09/08/11   Collins, Susette Racer, PA-C  TOUJEO MAX SOLOSTAR 300 UNIT/ML Solostar Pen  SMARTSIG:44 Unit(s) SUB-Q Twice Daily 07/13/20   [provider]    Allergies    Statins, Norvasc [amlodipine], Penicillins, Sulfa antibiotics, and Latex  Review of Systems   Review of Systems  Constitutional: Positive for fatigue (since covid). Negative for fever.  HENT: Negative for sore throat.   Eyes: Negative for visual disturbance.  Respiratory: Negative for cough and shortness of breath.   Cardiovascular: Negative for chest pain.  Gastrointestinal: Positive for nausea. Negative for abdominal pain and vomiting.  Genitourinary: Negative for dysuria.  Musculoskeletal: Negative for back pain and neck pain.  Skin: Negative for rash.  Neurological: Positive for dizziness (since covid) and headaches. Negative for focal weakness.    Physical Exam Updated Vital Signs BP (!) 152/73 (BP Location: Right Arm)   Pulse 62   Temp 98.7 F (37.1 C) (Oral)   Resp 18   Ht 5' 4.5" (1.638 m)   Wt 106.6 kg   SpO2 98%   BMI 39.71 kg/m   Physical Exam Vitals and nursing note reviewed.  Constitutional:      General: She is not in acute distress.    Appearance: She is well-developed.  HENT:     Head: Normocephalic and atraumatic.  Eyes:     Conjunctiva/sclera: Conjunctivae normal.  Cardiovascular:     Rate and Rhythm: Normal rate and regular rhythm.     Heart sounds: No murmur heard.   Pulmonary:     Effort: Pulmonary effort is normal. No respiratory distress.     Breath sounds: Normal breath sounds.  Abdominal:     Palpations: Abdomen is soft.     Tenderness: There is no abdominal tenderness.  Musculoskeletal:     Cervical back: Neck supple.  Skin:    General: Skin is warm and dry.  Neurological:     Mental Status: She is alert.     GCS: GCS eye subscore is 4. GCS verbal subscore is 5. GCS motor subscore is 6.     Cranial Nerves: No cranial nerve deficit or dysarthria.     Gait: Gait normal.     ED Results / Procedures / Treatments   Labs (all labs ordered are  listed, but only abnormal results are displayed) Labs Reviewed - No data to display  EKG None  Radiology DG Shoulder Right  Result Date: 12/25/2020 CLINICAL DATA:  Fall, pain EXAM: RIGHT SHOULDER - 2+ VIEW COMPARISON:  08/06/2020 FINDINGS: There is minimal glenohumeral and AC joint degenerative change. There is no evidence of fracture. IMPRESSION: No acute osseous abnormality. Electronically Signed   By: Maurine Simmering   On: 12/25/2020 12:36   CT Head Wo Contrast  Result Date: 12/25/2020 CLINICAL DATA:  Head trauma, minor (Age >= 65y) EXAM: CT HEAD WITHOUT CONTRAST TECHNIQUE: Contiguous axial images were obtained from the base of the skull through the vertex without intravenous contrast. COMPARISON:  MRI head 04/28/2018 FINDINGS: Brain: No evidence of acute infarction, hemorrhage, hydrocephalus, extra-axial collection or mass lesion/mass effect.Scattered subcortical and periventricular white matter hypodensities, nonspecific but likely sequela of small vessel ischemic disease. Vascular: No hyperdense vessel or unexpected calcification. Skull: Normal. Negative for fracture or focal lesion. Sinuses/Orbits: Minimal right mastoid fluid. The paranasal sinuses are predominantly clear. Other: None. IMPRESSION: No acute intracranial abnormality. Electronically Signed   By: Maurine Simmering   On: 12/25/2020 12:34   DG Hip Unilat With Pelvis 2-3 Views Right  Result Date: 12/25/2020 CLINICAL DATA:  Fall, pain EXAM: DG HIP (WITH OR WITHOUT PELVIS) 2-3V RIGHT COMPARISON:  CT abdomen pelvis 06/06/2016 FINDINGS: There is no evidence of right hip fracture or dislocation. There is mild degenerative change with osteophyte formation and subchondral sclerosis. Mild findings of osteitis pubis. IMPRESSION: No evidence of right hip fracture.  Mild right hip osteoarthritis. Electronically Signed   By: Maurine Simmering   On: 12/25/2020 12:37    Procedures Procedures   Medications Ordered in ED Medications - No data to display  ED  Course  I have reviewed the triage vital signs and the nursing notes.  Pertinent labs & imaging results that were available during my care of the patient were reviewed by me and considered in my medical decision making (see chart for details).  Clinical Course as of 12/25/20 1733  Fri Dec 25, 2020  1242 Right shoulder and hip interpreted by me as no acute fractures. [MB]    Clinical Course User Index [MB] Hayden Rasmussen, MD   MDM Rules/Calculators/A&P                         70 year old female on anticoagulation here after a fall in which she struck her head 3 weeks ago.  Differential includes intracranial bleed including subdural, posttraumatic headache, concussion, fracture, dislocation.  Imaging does not show any acute findings.  Reviewed with patient and she is comfortable plan for symptomatic treatment and follow-up with primary care doctor.  Return  Final Clinical Im was discussed pression(s) / ED Diagnoses Final diagnoses:  Acute post-traumatic headache, not intractable  Acute pain of right shoulder  Right hip pain  Fall, initial encounter    Rx / DC Orders ED Discharge Orders    None       Hayden Rasmussen, MD 12/25/20 1734

## 2020-12-29 NOTE — Progress Notes (Signed)
[facility] Note  12/30/2020     CHIEF COMPLAINT Patient presents for Retina Follow Up   HISTORY OF PRESENT ILLNESS: Debra Jordan is a 70 y.o. female who presents to the clinic today for:   HPI    Retina Follow Up    Patient presents with  Diabetic Retinopathy.  In both eyes.  This started 13 months ago.  Severity is moderate.  Duration of 4 months.  Since onset it is stable.  I, the attending physician,  performed the HPI with the patient and updated documentation appropriately.          Comments    70 y/o female pt here for 4 mo f/u for mod NPDR w/DME OU.  No change in [place] OU noticed.  Denies pain, FOL, floaters, but has had intermittent irritation/FBS OD over the past month.  AT prn OU.  BS in office today was 223, but pt has not taken her diabetic meds yet today.  Last A1C was 6.7.       Last edited by Bernarda Caffey, MD on 12/30/2020  1:22 PM. (History)    Patient states she lost the lens out of her glasses, so the glasses she is wearing today are 68 years old, pt states since she had covid, she is getting "grey outs" in vision and losing her balance, she states this happened about a month ago while climbing a ladder, she fell straight back and it her head   Referring physician: [address], [place], [place],  [place] [address]  HISTORICAL INFORMATION:   Selected notes from the MEDICAL RECORD NUMBER Referral from Dr. Parke Simmers for eval of NPDR w/DME OU. VA cc: OD: 20/30- OS: 20/50 Wearing Rx: OD: +1.50+0.25x1 OS: +1.00+0.25x102 MRx: OD: +1.25+1.25x180 20/25 OS: +1.25 sph 20/40 IOPS 19,17 Mixed form cataract OU, Htn Ret OU    CURRENT MEDICATIONS: No current outpatient medications on file. (Ophthalmic Drugs)   No current facility-administered medications for this visit. (Ophthalmic Drugs)   Current Outpatient Medications (Other)  Medication Sig  . albuterol (VENTOLIN HFA) 108 (90 Base) MCG/ACT inhaler SMARTSIG:1-2 Puff(s) By  Mouth Every 4 Hours PRN  . amLODipine (NORVASC) 5 MG tablet Take 5 mg by mouth at bedtime.   . Armodafinil 150 MG tablet Take 150 mg by mouth as directed. 1/2 tablet prn  . chlorthalidone (HYGROTON) 25 MG tablet Take 12.5 mg by mouth every morning.  . Cholecalciferol (VITAMIN D3 PO) Take by mouth daily.  . cyclobenzaprine (FLEXERIL) 10 MG tablet Take 10 mg by mouth at bedtime.   . DULoxetine (CYMBALTA) 60 MG capsule Take 60 mg by mouth daily.   . fluconazole (DIFLUCAN) 200 MG tablet Take 200 mg by mouth once a week.  . furosemide (LASIX) 20 MG tablet Take 1 tablet (20 mg total) by mouth daily as needed.  Marland Kitchen glucose blood (ACCU-CHEK AVIVA PLUS) test strip Check blood sugar 4x a day as directed Dx code-E11.51-patient needs Rx for lancets for Accu-chek Aviva  . insulin lispro (HUMALOG) 100 UNIT/ML injection Inject 3-15 Units into the skin 4 (four) times daily. Sliding scale  . Insulin Pen Needle (BD PEN NEEDLE NANO U/F) 32G X 4 MM MISC Use 1 pen needle as directed with Rx of Humalog and Lantus Dx code-E11.51  . labetalol (NORMODYNE) 100 MG tablet Take 1 tablet (100 mg total) by mouth 2 (two) times daily.  . Levothyroxine Sodium 100 MCG CAPS Take 100 mcg by mouth every morning.   Marland Kitchen  losartan (COZAAR) 50 MG tablet Take 50 mg by mouth 2 (two) times daily.   . montelukast (SINGULAIR) 10 MG tablet Take 10 mg by mouth at bedtime.  . Multiple Vitamin (MULTIVITAMIN) tablet Take 1 tablet by mouth daily.  . Multiple Vitamins-Minerals (CENTRUM SILVER 50+WOMEN) TABS See admin instructions.  . ondansetron (ZOFRAN) 4 MG tablet Take 4 mg by mouth 3 (three) times daily as needed.  . pantoprazole (PROTONIX) 40 MG tablet Take 40 mg by mouth daily.  . Pitavastatin Calcium 2 MG TABS Take 1 tablet (2 mg total) by mouth daily.  . rivaroxaban (XARELTO) 2.5 MG TABS tablet Take 2.5 mg by mouth 2 (two) times daily. XARLETO  . TOUJEO MAX SOLOSTAR 300 UNIT/ML Solostar Pen SMARTSIG:44 Unit(s) SUB-Q Twice Daily  . ezetimibe  (ZETIA) 10 MG tablet Take 1 tablet (10 mg total) by mouth daily after supper.  . fenofibrate 160 MG tablet Take 1 tablet (160 mg total) by mouth daily.   No current facility-administered medications for this visit. (Other)      REVIEW OF SYSTEMS: ROS    Positive for: Genitourinary, Musculoskeletal, Endocrine, Eyes, Psychiatric   Negative for: Constitutional, Gastrointestinal, Neurological, Skin, HENT, Cardiovascular, Respiratory, Allergic/Imm, Heme/Lymph   Last edited by Matthew Folks, COA on 12/30/2020  1:11 PM. (History)       ALLERGIES Allergies  Allergen Reactions  . Statins Other (See Comments)    Severe myalgia and abdominal cramps  . Norvasc [Amlodipine]     Difficulty breathing   . Penicillins   . Sulfa Antibiotics Nausea Only  . Latex Rash    Occasional SOB    PAST MEDICAL HISTORY Past Medical History:  Diagnosis Date  . ADD (attention deficit disorder)   . Arthritis    gout  . Blue toe syndrome ([place]) 10/06/2011  . Broken shoulder    And tailbone   . Cataract    Mixed form OU  . Chronic kidney disease    hx of left nephrectomy  . Diabetes mellitus    type 2  . Diabetic retinopathy ([place])    NPDR OU  . DM w/o complication type II, uncontrolled 09/06/2011  . Fatty liver   . Fibromyalgia   . Gout 09/06/2011  . History of headache   . History of kidney stones   . History of nephrectomy, unilateral 09/06/2011  . History of PCOS   . History of venous thrombosis and embolism    Left great toe  . Hyperlipidemia   . Hypersomnolence   . Hypertension   . Hypertensive retinopathy    OU  . Hypothyroid 09/06/2011  . Hypothyroidism   . Ischemia of extremity 09/06/2011  . Migraine headache 09/06/2011  . Neuromuscular disorder (H[facility])    neuropathy to feet bilateral  . Obesity   . Seizure disorder ([place])   . Trouble swallowing    Past Surgical History:  Procedure Laterality Date  . ABDOMINAL HYSTERECTOMY    . HERNIA REPAIR    . LYMPH NODE BIOPSY    . NASAL  SINUS SURGERY    . NEPHRECTOMY Left 2002   left nephrectomy  . TONSILLECTOMY  1969  . TUBAL LIGATION      FAMILY HISTORY Family History  Problem Relation Age of Onset  . Other Mother        blood clot history, varicose veins  . Stroke Mother   . Diabetes Mother   . Cancer Father   . Stroke Maternal Grandmother   . Healthy Brother   .  Healthy Brother   . Healthy Brother   . Diabetes Daughter     SOCIAL HISTORY Social History   Tobacco Use  . Smoking status: Never Smoker  . Smokeless tobacco: Never Used  Vaping Use  . Vaping Use: Never used  Substance Use Topics  . Alcohol use: No  . Drug use: No         OPHTHALMIC EXAM:  Base Eye Exam    Visual Acuity (Snellen - Linear)      Right Left   Dist cc 20/25 -2 20/80 -2   Dist ph cc 20/20 -2 20/20 -2   Correction: Glasses  Old glasses       Tonometry (Tonopen, 1:14 PM)      Right Left   Pressure 14 15       Pupils      Dark Light Shape React APD   Right 3 2 Round Brisk None   Left 3 2 Round Brisk None       Visual Fields (Counting fingers)      Left Right    Full Full       Extraocular Movement      Right Left    Full, Ortho Full, Ortho       Neuro/Psych    Oriented x3: Yes   Mood/Affect: Normal       Dilation    Both eyes: 1.0% Mydriacyl, 2.5% Phenylephrine @ 1:14 PM        Slit Lamp and Fundus Exam    Slit Lamp Exam      Right Left   Lids/Lashes Dermatochalasis - upper lid, mild Meibomian gland dysfunction Dermatochalasis - upper lid, mild Meibomian gland dysfunction   Conjunctiva/Sclera White and quiet White and quiet   Cornea 1+inferior Punctate epithelial erosions 1+inferior Punctate epithelial erosions, decreased TBUT   Anterior Chamber Deep and quiet, narrow temporal angle Deep and quiet, narrow temporal angle   Iris Round and dilated, mild anterior bowing Round and dilated, mild anterior bowing   Lens 2-3+ Nuclear sclerosis, 2-3+ Cortical cataract 2-3+ Nuclear sclerosis, 2-3+  Cortical cataract   Vitreous Vitreous syneresis, Posterior vitreous detachment Vitreous syneresis       Fundus Exam      Right Left   Disc Pink and Sharp, Compact, mild temporal PPA Pink and Sharp, mild tilt   C/D Ratio 0.4 0.4   Macula Flat, blunted  foveal reflex, mild ERM, mild Retinal pigment epithelial mottling, scattered Microaneurysms/DBH, trace, persistent cystic changes Blunted foveal reflex, scattered MA/DBH/cystic changes   Vessels attenuated, mild tortuousity attenuated, mild tortuousity   Periphery Attached, scattered DBH greatest posteriorly    Attached, pigmented cystoid degeneration inferiorly, scattered MA greatest posteriorly          IMAGING AND PROCEDURES  Imaging and Procedures for _0 @  OCT, Retina - OU - Both Eyes       Right Eye Quality was good. Central Foveal Thickness: 286. Progression has worsened. Findings include normal foveal contour, no SRF, intraretinal fluid, intraretinal hyper-reflective material, epiretinal membrane (Interval development of ERM and blunting of foveal contour, trace cystic changes temporal mac).   Left Eye Quality was good. Central Foveal Thickness: 257. Progression has been stable. Findings include normal foveal contour, no SRF, vitreomacular adhesion , intraretinal fluid (Stable improvement in cystic changes temporal macula ).   Notes *Images captured and stored on drive  Diagnosis / Impression:  NFP, no SRF OU OD: Interval development of ERM and blunting of foveal contour, trace cystic changes  temporal mac OS: stable improvement in cystic changes temporal macula   Clinical management:  See below  Abbreviations: NFP - Normal foveal profile. CME - cystoid macular edema. PED - pigment epithelial detachment. IRF - intraretinal fluid. SRF - subretinal fluid. EZ - ellipsoid zone. ERM - epiretinal membrane. ORA - outer retinal atrophy. ORT - outer retinal tubulation. SRHM - subretinal hyper-reflective material                  ASSESSMENT/PLAN:    ICD-10-CM   1. Moderate nonproliferative diabetic retinopathy of both eyes with macular edema associated with type 2 diabetes mellitus ([place])  Z61.0960   2. Retinal edema  H35.81 OCT, Retina - OU - Both Eyes  3. Epiretinal membrane (ERM) of right eye  H35.371   4. Essential hypertension  I10   5. Hypertensive retinopathy of both eyes  H35.033   6. Combined forms of age-related cataract of both eyes  H25.813     1,2. Moderate Non-proliferative diabetic retinopathy w/ DME OU  - A1c improved to 6.2 from 8 per pt report  - exam shows scattered MA and IRH OU; no NV  - FA (04.20.21) w/ leaking MA OU, no NV  - BCVA 20/20 OU  - OCT shows OD: persistent cystic changes temporal macula -- mild; OS: interval improvement in cystic changes temporal macula -- best seen on widefiled  - pt reports recent stressors have impacted BG control -- pt has had Covid twice  - no intervention recommended -- continue monitoring  - f/u in 4-6 months, sooner prn -- DFE, OCT  3. Epiretinal membrane, OD - The natural history, anatomy, potential for loss of vision, and treatment options including vitrectomy techniques and the complications of endophthalmitis, retinal detachment, vitreous hemorrhage, cataract progression and permanent vision loss discussed with the patient. - mild ERM w/ blunted foveal contour-- first noted 5.11.22 - BCVA 20/20 - asymptomatic, no metamorphopsia - no indication for surgery at this time - monitor for now - f/u 4-6 mos -- DFE/OCT  4,5. Hypertensive retinopathy OU  - discussed importance of tight BP control  - monitor  6. Mixed cataract OU  - The symptoms of cataract, surgical options, and treatments and risks were discussed with patient.  - discussed diagnosis and progression  - not yet visually significant  - monitor for now   Ophthalmic Meds Ordered this visit:  No orders of the defined types were placed in this encounter.      Return for f/u  4-6 months, ERM OD, DFE, OCT.  There are no Patient Instructions on file for this visit.   Explained the diagnoses, plan, and follow up with the patient and they expressed understanding.  Patient expressed understanding of the importance of proper follow up care.   This document serves as a record of services personally performed by Gardiner Sleeper, MD, PhD. It was created on their behalf by Roselee Nova, COMT. The creation of this record is the provider's dictation and/or activities during the visit.  Electronically signed by: Roselee Nova, COMT 12/30/20 3:24 PM   This document serves as a record of services personally performed by Gardiner Sleeper, MD, PhD. It was created on their behalf by San Jetty. Owens Shark, OA an ophthalmic technician. The creation of this record is the provider's dictation and/or activities during the visit.    Electronically signed by: San Jetty. Owens Shark, [place] 05.11.2022 3:24 PM  Gardiner Sleeper, M.D., Ph.D. Diseases & Surgery of the Retina and Vitreous [facility]  I have reviewed the above documentation for accuracy and completeness, and I agree with the above. Gardiner Sleeper, M.D., Ph.D. 12/30/20 3:24 PM   Abbreviations: M myopia (nearsighted); A astigmatism; H hyperopia (farsighted); P presbyopia; Mrx spectacle prescription;  CTL contact lenses; OD right eye; OS left eye; OU both eyes  XT exotropia; ET esotropia; PEK punctate epithelial keratitis; PEE punctate epithelial erosions; DES dry eye syndrome; MGD meibomian gland dysfunction; ATs artificial tears; PFAT's preservative free artificial tears; [place] nuclear sclerotic cataract; PSC posterior subcapsular cataract; ERM epi-retinal membrane; PVD posterior vitreous detachment; RD retinal detachment; DM diabetes mellitus; DR diabetic retinopathy; NPDR non-proliferative diabetic retinopathy; PDR proliferative diabetic retinopathy; CSME clinically significant macular edema; DME diabetic macular edema; dbh  dot blot hemorrhages; CWS cotton wool spot; POAG primary open angle glaucoma; C/D cup-to-disc ratio; HVF humphrey visual field; GVF goldmann visual field; OCT optical coherence tomography; IOP intraocular pressure; BRVO Branch retinal vein occlusion; CRVO central retinal vein occlusion; CRAO central retinal artery occlusion; BRAO branch retinal artery occlusion; RT retinal tear; SB scleral buckle; PPV pars plana vitrectomy; VH Vitreous hemorrhage; PRP panretinal laser photocoagulation; IVK intravitreal kenalog; VMT vitreomacular traction; MH Macular hole;  NVD neovascularization of the disc; NVE neovascularization elsewhere; AREDS age related eye disease study; ARMD age related macular degeneration; POAG primary open angle glaucoma; EBMD epithelial/anterior basement membrane dystrophy; ACIOL anterior chamber intraocular lens; IOL intraocular lens; PCIOL posterior chamber intraocular lens; Phaco/IOL phacoemulsification with intraocular lens placement; [place] photorefractive keratectomy; LASIK laser assisted in situ keratomileusis; HTN hypertension; DM diabetes mellitus; COPD chronic obstructive pulmonary disease

## 2020-12-30 ENCOUNTER — Ambulatory Visit (INDEPENDENT_AMBULATORY_CARE_PROVIDER_SITE_OTHER): Payer: Medicare Other | Admitting: Ophthalmology

## 2020-12-30 ENCOUNTER — Encounter (INDEPENDENT_AMBULATORY_CARE_PROVIDER_SITE_OTHER): Payer: Self-pay | Admitting: Ophthalmology

## 2020-12-30 ENCOUNTER — Other Ambulatory Visit: Payer: Self-pay

## 2020-12-30 DIAGNOSIS — H25813 Combined forms of age-related cataract, bilateral: Secondary | ICD-10-CM

## 2020-12-30 DIAGNOSIS — E113313 Type 2 diabetes mellitus with moderate nonproliferative diabetic retinopathy with macular edema, bilateral: Secondary | ICD-10-CM

## 2020-12-30 DIAGNOSIS — I10 Essential (primary) hypertension: Secondary | ICD-10-CM

## 2020-12-30 DIAGNOSIS — H35371 Puckering of macula, right eye: Secondary | ICD-10-CM

## 2020-12-30 DIAGNOSIS — H3581 Retinal edema: Secondary | ICD-10-CM | POA: Diagnosis not present

## 2020-12-30 DIAGNOSIS — H35033 Hypertensive retinopathy, bilateral: Secondary | ICD-10-CM

## 2021-03-15 ENCOUNTER — Institutional Professional Consult (permissible substitution): Payer: Medicare Other | Admitting: Diagnostic Neuroimaging

## 2021-04-21 ENCOUNTER — Other Ambulatory Visit: Payer: Self-pay | Admitting: *Deleted

## 2021-04-27 ENCOUNTER — Encounter: Payer: Self-pay | Admitting: Diagnostic Neuroimaging

## 2021-04-27 ENCOUNTER — Ambulatory Visit: Payer: Medicare Other | Admitting: Diagnostic Neuroimaging

## 2021-04-27 VITALS — BP 113/68 | HR 66 | Ht 64.0 in | Wt 231.2 lb

## 2021-04-27 DIAGNOSIS — R413 Other amnesia: Secondary | ICD-10-CM
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
## 2021-05-24 ENCOUNTER — Encounter (INDEPENDENT_AMBULATORY_CARE_PROVIDER_SITE_OTHER): Payer: Medicare Other | Admitting: Ophthalmology

## 2021-05-24 DIAGNOSIS — E113312 Type 2 diabetes mellitus with moderate nonproliferative diabetic retinopathy with macular edema, left eye: Secondary | ICD-10-CM

## 2021-05-24 DIAGNOSIS — I10 Essential (primary) hypertension: Secondary | ICD-10-CM

## 2021-05-24 DIAGNOSIS — E113313 Type 2 diabetes mellitus with moderate nonproliferative diabetic retinopathy with macular edema, bilateral: Secondary | ICD-10-CM

## 2021-05-24 DIAGNOSIS — H35033 Hypertensive retinopathy, bilateral: Secondary | ICD-10-CM

## 2021-05-24 DIAGNOSIS — H35371 Puckering of macula, right eye: Secondary | ICD-10-CM

## 2021-05-24 DIAGNOSIS — E113391 Type 2 diabetes mellitus with moderate nonproliferative diabetic retinopathy without macular edema, right eye: Secondary | ICD-10-CM

## 2021-05-24 DIAGNOSIS — H25813 Combined forms of age-related cataract, bilateral: Secondary | ICD-10-CM

## 2021-05-24 DIAGNOSIS — H3581 Retinal edema: Secondary | ICD-10-CM

## 2021-06-02 NOTE — Progress Notes (Addendum)
[facility] Note  06/07/2021     CHIEF COMPLAINT Patient presents for Retina Follow Up   HISTORY OF PRESENT ILLNESS: Debra Jordan is a 70 y.o. female who presents to the clinic today for:   HPI     Retina Follow Up   Patient presents with  Diabetic Retinopathy.  In both eyes.  Duration of 5 months.  I, the attending physician,  performed the HPI with the patient and updated documentation appropriately.        Comments   5 month follow up NPDR OU- Some days vision is better than others.  At times she will have to put on her stronger glasses to read or get closer to the TV to see the writing.  Patient states her sugar has a lot to do with it.  BS 160 an hour ago, 75 this am A1C 6.8      Last edited by Bernarda Caffey, MD on 06/07/2021 10:11 PM.    Patient states her old glasses broke, so she has been getting different strength reading glasses to use, she is wearing a +2.50 today, but says she sometimes has to go as high as a +3.00  Referring physician: Arnoldo Hooker, MD [place],  [place] [address]  HISTORICAL INFORMATION:  Selected notes from the MEDICAL RECORD NUMBER Referral from Dr. Parke Simmers for eval of NPDR w/DME OU. VA cc: OD: 20/30- OS: 20/50 Wearing Rx: OD: +1.50+0.25x1 OS: +1.00+0.25x102 MRx: OD: +1.25+1.25x180 20/25 OS: +1.25 sph 20/40 IOPS 19,17 Mixed form cataract OU, Htn Ret OU    CURRENT MEDICATIONS: No current outpatient medications on file. (Ophthalmic Drugs)   No current facility-administered medications for this visit. (Ophthalmic Drugs)   Current Outpatient Medications (Other)  Medication Sig   albuterol (VENTOLIN HFA) 108 (90 Base) MCG/ACT inhaler SMARTSIG:1-2 Puff(s) By Mouth Every 4 Hours PRN   amLODipine (NORVASC) 5 MG tablet Take 5 mg by mouth at bedtime.    Armodafinil 150 MG tablet Take 150 mg by mouth as directed. 1/2 tablet prn   chlorthalidone (HYGROTON) 25 MG tablet Take 12.5 mg by mouth  every morning.   Cholecalciferol (VITAMIN D3 PO) Take by mouth daily.   cyclobenzaprine (FLEXERIL) 10 MG tablet Take 10 mg by mouth at bedtime.   DULoxetine (CYMBALTA) 60 MG capsule Take 1 capsule by mouth daily.   furosemide (LASIX) 20 MG tablet Take 1 tablet (20 mg total) by mouth daily as needed.   insulin lispro (HUMALOG) 100 UNIT/ML injection Inject 3-15 Units into the skin 4 (four) times daily. Sliding scale   labetalol (NORMODYNE) 100 MG tablet Take 1 tablet (100 mg total) by mouth 2 (two) times daily.   levothyroxine (SYNTHROID) 112 MCG tablet Take 1 tablet by mouth daily.   losartan (COZAAR) 50 MG tablet Take 50 mg by mouth 2 (two) times daily.    Multiple Vitamin (MULTIVITAMIN) tablet Take 1 tablet by mouth daily.   Multiple Vitamins-Minerals (CENTRUM SILVER 50+WOMEN) TABS See admin instructions.   ondansetron (ZOFRAN) 4 MG tablet Take 4 mg by mouth 3 (three) times daily as needed.   pantoprazole (PROTONIX) 40 MG tablet Take 40 mg by mouth daily.   rivaroxaban (XARELTO) 2.5 MG TABS tablet Take 2.5 mg by mouth 2 (two) times daily. XARLETO   TOUJEO MAX SOLOSTAR 300 UNIT/ML Solostar Pen SMARTSIG:44 Unit(s) SUB-Q Twice Daily   ezetimibe (ZETIA) 10 MG tablet Take 1 tablet (10 mg total) by mouth daily after supper.  fenofibrate 160 MG tablet Take 1 tablet (160 mg total) by mouth daily.   Insulin Pen Needle (BD PEN NEEDLE NANO U/F) 32G X 4 MM MISC Use 1 pen needle as directed with Rx of Humalog and Lantus Dx code-E11.51   montelukast (SINGULAIR) 10 MG tablet Take 10 mg by mouth at bedtime. (Patient not taking: No sig reported)   Pitavastatin Calcium 2 MG TABS Take 1 tablet (2 mg total) by mouth daily. (Patient not taking: No sig reported)   No current facility-administered medications for this visit. (Other)   REVIEW OF SYSTEMS: ROS   Positive for: Genitourinary, Musculoskeletal, Endocrine, Eyes, Psychiatric Negative for: Constitutional, Gastrointestinal, Neurological, Skin, HENT,  Cardiovascular, Respiratory, Allergic/Imm, Heme/Lymph Last edited by Leonie Douglas, COA on 06/07/2021  1:03 PM.    ALLERGIES Allergies  Allergen Reactions   Statins Other (See Comments)    Severe myalgia and abdominal cramps   Norvasc [Amlodipine]     Difficulty breathing    Penicillins    Sulfa Antibiotics Nausea Only   Latex Rash    Occasional SOB   PAST MEDICAL HISTORY Past Medical History:  Diagnosis Date   ADD (attention deficit disorder)    Arthritis    gout   Blue toe syndrome ([place]) 10/06/2011   Broken shoulder    And tailbone    Cataract    Mixed form OU   Chronic kidney disease    hx of left nephrectomy   Diabetes mellitus    type 2   Diabetic retinopathy ([place])    NPDR OU   DM w/o complication type II, uncontrolled 09/06/2011   Fatty liver    Fibromyalgia    Gout 09/06/2011   History of headache    History of kidney stones    History of nephrectomy, unilateral 09/06/2011   History of PCOS    History of venous thrombosis and embolism    Left great toe   Hyperlipidemia    Hypersomnolence    Hypertension    Hypertensive retinopathy    OU   Hypothyroid 09/06/2011   Hypothyroidism    Ischemia of extremity 09/06/2011   Migraine headache 09/06/2011   Neuromuscular disorder (HCC)    neuropathy to feet bilateral   Obesity    Seizure disorder ([place])    Trouble swallowing    Past Surgical History:  Procedure Laterality Date   ABDOMINAL HYSTERECTOMY     HERNIA REPAIR     LYMPH NODE BIOPSY     NASAL SINUS SURGERY     NEPHRECTOMY Left 2002   left nephrectomy   TONSILLECTOMY  1969   TUBAL LIGATION      FAMILY HISTORY Family History  Problem Relation Age of Onset   Other Mother        blood clot history, varicose veins   Stroke Mother    Diabetes Mother    Cancer Father    Stroke Maternal Grandmother    Healthy Brother    Healthy Brother    Healthy Brother    Diabetes Daughter     SOCIAL HISTORY Social History   Tobacco Use   Smoking status:  Never   Smokeless tobacco: Never  Vaping Use   Vaping Use: Never used  Substance Use Topics   Alcohol use: No   Drug use: No       OPHTHALMIC EXAM: Base Eye Exam     Visual Acuity (Snellen - Linear)       Right Left   Dist cc 20/40 20/80 -2  Dist ph cc 20/25 20/25    Correction: Glasses  She is wearing OTC readers for distance today.  She has different powers at home.         Tonometry (Tonopen, 1:11 PM)       Right Left   Pressure 14 15         Pupils       Dark Light Shape React APD   Right 3 2 Round Brisk None   Left 3 2 Round Brisk None         Visual Fields (Counting fingers)       Left Right    Full Full         Extraocular Movement       Right Left    Full Full         Neuro/Psych     Oriented x3: Yes   Mood/Affect: Normal         Dilation     Both eyes: 1.0% Mydriacyl, 2.5% Phenylephrine @ 1:11 PM           Slit Lamp and Fundus Exam     Slit Lamp Exam       Right Left   Lids/Lashes Dermatochalasis - upper lid, mild Meibomian gland dysfunction Dermatochalasis - upper lid, mild Meibomian gland dysfunction   Conjunctiva/Sclera White and quiet White and quiet   Cornea trace Punctate epithelial erosions Trace Punctate epithelial erosions   Anterior Chamber Deep and quiet, narrow temporal angle Deep and quiet, narrow temporal angle   Iris Round and dilated, mild anterior bowing Round and dilated, mild anterior bowing   Lens 2-3+ Nuclear sclerosis with brunescence, 2-3+ Cortical cataract 2-3+ Nuclear sclerosis with brunescence, 2-3+ Cortical cataract   Vitreous Vitreous syneresis, Posterior vitreous detachment Vitreous syneresis         Fundus Exam       Right Left   Disc Pink and Sharp, Compact, mild temporal PPA Pink and Sharp, mild tilt   C/D Ratio 0.4 0.4   Macula Flat, blunted foveal reflex, mild ERM, mild Retinal pigment epithelial mottling, scattered Microaneurysms/DBH, trace, persistent cystic changes Blunted  foveal reflex, scattered MA/DBH/cystic changes -- all improved   Vessels attenuated, mild tortuousity, ,mild Copper wiring attenuated, mild tortuousity   Periphery Attached, scattered DBH greatest posteriorly    Attached, pigmented cystoid degeneration inferiorly, scattered MA greatest posteriorly           Refraction     Wearing Rx       Sphere   Right +2.50   Left +2.50            IMAGING AND PROCEDURES  Imaging and Procedures for '@TODAY' @  OCT, Retina - OU - Both Eyes       Right Eye Quality was good. Central Foveal Thickness: 296. Progression has worsened. Findings include normal foveal contour, no SRF, intraretinal fluid, intraretinal hyper-reflective material, epiretinal membrane (ERM and progressive blunting of foveal contour, interval increase in central trace cystic changes ).   Left Eye Quality was good. Central Foveal Thickness: 241. Progression has been stable. Findings include normal foveal contour, no SRF, vitreomacular adhesion , intraretinal fluid (Interval release of VMA to partial PVD).   Notes *Images captured and stored on drive  Diagnosis / Impression:  NFP, no SRF OU OD: ERM and progressive blunting of foveal contour, interval increase in central trace cystic changes, partial PVD OS: Interval release of VMA to partial PVD  Clinical management:  See below  Abbreviations: NFP -  Normal foveal profile. CME - cystoid macular edema. PED - pigment epithelial detachment. IRF - intraretinal fluid. SRF - subretinal fluid. EZ - ellipsoid zone. ERM - epiretinal membrane. ORA - outer retinal atrophy. ORT - outer retinal tubulation. SRHM - subretinal hyper-reflective material            ASSESSMENT/PLAN:   ICD-10-CM   1. Moderate nonproliferative diabetic retinopathy of both eyes with macular edema associated with type 2 diabetes mellitus ([place])  E32.1224     2. Retinal edema  H35.81 OCT, Retina - OU - Both Eyes    3. Epiretinal membrane (ERM) of right  eye  H35.371     4. Essential hypertension  I10     5. Hypertensive retinopathy of both eyes  H35.033     6. Combined forms of age-related cataract of both eyes  H25.813       1,2. Moderate Non-proliferative diabetic retinopathy w/ DME OU  - A1c improved to 6.2 from 8 per pt report  - exam shows scattered MA and IRH OU; no NV  - FA (04.20.21) w/ leaking MA OU, no NV  - BCVA 20/25 OU -- decreased  - OCT shows OD: ERM and progressive blunting of foveal contour, interval increase in central trace cystic changes, partial PVD; OS: Interval release of VMA to partial PVD  - pt reports recent stressors have impacted BG control -- pt has had Covid twice  - no intervention recommended -- continue monitoring  - f/u in 5 months, sooner prn -- DFE, OCT  3. Epiretinal membrane, OD - mild ERM w/ blunted foveal contour-- first noted 5.11.22 - BCVA 20/25 (dec from 20/20) - asymptomatic, no metamorphopsia - OCT shows progressive blunting of foveal contour, interval increaese in central trace cystic changes - no indication for surgery at this time - monitor for now - f/u 5 mos -- DFE/OCT  4,5. Hypertensive retinopathy OU  - discussed importance of tight BP control  - monitor  6. Mixed cataract OU  - The symptoms of cataract, surgical options, and treatments and risks were discussed with patient.  - discussed diagnosis and mild progression   Ophthalmic Meds Ordered this visit:  No orders of the defined types were placed in this encounter.    Return in about 5 months (around 11/05/2021) for f/u NPDR OU, DFE, OCT.  There are no Patient Instructions on file for this visit.  This document serves as a record of services personally performed by Gardiner Sleeper, MD, PhD. It was created on their behalf by Leeann Must, [place], an ophthalmic technician. The creation of this record is the provider's dictation and/or activities during the visit.    Electronically signed by: Leeann Must, COA '@TODAY' @  10:17 PM  This document serves as a record of services personally performed by Gardiner Sleeper, MD, PhD. It was created on their behalf by San Jetty. Owens Shark, OA an ophthalmic technician. The creation of this record is the provider's dictation and/or activities during the visit.    Electronically signed by: San Jetty. Owens Shark, [place] 10.17.2022 10:17 PM  Gardiner Sleeper, M.D., Ph.D. Diseases & Surgery of the Retina and [place] 06/07/2021   I have reviewed the above documentation for accuracy and completeness, and I agree with the above. Gardiner Sleeper, M.D., Ph.D. 06/07/21 10:17 PM  Abbreviations: M myopia (nearsighted); A astigmatism; H hyperopia (farsighted); P presbyopia; Mrx spectacle prescription;  CTL contact lenses; OD right eye; OS left eye; OU both  eyes  XT exotropia; ET esotropia; PEK punctate epithelial keratitis; PEE punctate epithelial erosions; DES dry eye syndrome; MGD meibomian gland dysfunction; ATs artificial tears; PFAT's preservative free artificial tears; [place] nuclear sclerotic cataract; PSC posterior subcapsular cataract; ERM epi-retinal membrane; PVD posterior vitreous detachment; RD retinal detachment; DM diabetes mellitus; DR diabetic retinopathy; NPDR non-proliferative diabetic retinopathy; PDR proliferative diabetic retinopathy; CSME clinically significant macular edema; DME diabetic macular edema; dbh dot blot hemorrhages; CWS cotton wool spot; POAG primary open angle glaucoma; C/D cup-to-disc ratio; HVF humphrey visual field; GVF goldmann visual field; OCT optical coherence tomography; IOP intraocular pressure; BRVO Branch retinal vein occlusion; CRVO central retinal vein occlusion; CRAO central retinal artery occlusion; BRAO branch retinal artery occlusion; RT retinal tear; SB scleral buckle; PPV pars plana vitrectomy; VH Vitreous hemorrhage; PRP panretinal laser photocoagulation; IVK intravitreal kenalog; VMT vitreomacular traction; MH Macular  hole;  NVD neovascularization of the disc; NVE neovascularization elsewhere; AREDS age related eye disease study; ARMD age related macular degeneration; POAG primary open angle glaucoma; EBMD epithelial/anterior basement membrane dystrophy; ACIOL anterior chamber intraocular lens; IOL intraocular lens; PCIOL posterior chamber intraocular lens; Phaco/IOL phacoemulsification with intraocular lens placement; [place] photorefractive keratectomy; LASIK laser assisted in situ keratomileusis; HTN hypertension; DM diabetes mellitus; COPD chronic obstructive pulmonary disease

## 2021-06-07 ENCOUNTER — Other Ambulatory Visit: Payer: Self-pay

## 2021-06-07 ENCOUNTER — Encounter (INDEPENDENT_AMBULATORY_CARE_PROVIDER_SITE_OTHER): Payer: Self-pay | Admitting: Ophthalmology

## 2021-06-07 ENCOUNTER — Ambulatory Visit (INDEPENDENT_AMBULATORY_CARE_PROVIDER_SITE_OTHER): Payer: Medicare Other | Admitting: Ophthalmology

## 2021-06-07 DIAGNOSIS — H35033 Hypertensive retinopathy, bilateral: Secondary | ICD-10-CM | POA: Diagnosis not present

## 2021-06-07 DIAGNOSIS — H35371 Puckering of macula, right eye: Secondary | ICD-10-CM

## 2021-06-07 DIAGNOSIS — E113313 Type 2 diabetes mellitus with moderate nonproliferative diabetic retinopathy with macular edema, bilateral: Secondary | ICD-10-CM | POA: Diagnosis not present

## 2021-06-07 DIAGNOSIS — H3581 Retinal edema: Secondary | ICD-10-CM

## 2021-06-07 DIAGNOSIS — E113312 Type 2 diabetes mellitus with moderate nonproliferative diabetic retinopathy with macular edema, left eye: Secondary | ICD-10-CM

## 2021-06-07 DIAGNOSIS — I10 Essential (primary) hypertension: Secondary | ICD-10-CM | POA: Diagnosis not present

## 2021-06-07 DIAGNOSIS — E113391 Type 2 diabetes mellitus with moderate nonproliferative diabetic retinopathy without macular edema, right eye: Secondary | ICD-10-CM

## 2021-06-07 DIAGNOSIS — H25813 Combined forms of age-related cataract, bilateral: Secondary | ICD-10-CM

## 2021-09-16 ENCOUNTER — Other Ambulatory Visit: Payer: Self-pay

## 2021-09-16 ENCOUNTER — Emergency Department (HOSPITAL_BASED_OUTPATIENT_CLINIC_OR_DEPARTMENT_OTHER): Payer: Medicare Other

## 2021-09-16 ENCOUNTER — Emergency Department (HOSPITAL_COMMUNITY): Payer: Medicare Other

## 2021-09-16 ENCOUNTER — Encounter (HOSPITAL_BASED_OUTPATIENT_CLINIC_OR_DEPARTMENT_OTHER): Payer: Self-pay

## 2021-09-16 ENCOUNTER — Observation Stay (HOSPITAL_BASED_OUTPATIENT_CLINIC_OR_DEPARTMENT_OTHER)
Admission: EM | Admit: 2021-09-16 | Discharge: 2021-09-17 | Disposition: A | Discharge status: Home / Self Care | Payer: Medicare Other | Attending: Emergency Medicine | Admitting: Emergency Medicine

## 2021-09-16 DIAGNOSIS — I129 Hypertensive chronic kidney disease with stage 1 through stage 4 chronic kidney disease, or unspecified chronic kidney disease: Secondary | ICD-10-CM | POA: Insufficient documentation

## 2021-09-16 DIAGNOSIS — R7401 Elevation of levels of liver transaminase levels: Secondary | ICD-10-CM | POA: Insufficient documentation

## 2021-09-16 DIAGNOSIS — R1013 Epigastric pain: Secondary | ICD-10-CM | POA: Diagnosis present

## 2021-09-16 DIAGNOSIS — K8021 Calculus of gallbladder without cholecystitis with obstruction: Secondary | ICD-10-CM | POA: Diagnosis not present

## 2021-09-16 DIAGNOSIS — Z794 Long term (current) use of insulin: Secondary | ICD-10-CM | POA: Diagnosis not present

## 2021-09-16 DIAGNOSIS — E039 Hypothyroidism, unspecified: Secondary | ICD-10-CM | POA: Diagnosis not present

## 2021-09-16 DIAGNOSIS — N189 Chronic kidney disease, unspecified: Secondary | ICD-10-CM | POA: Insufficient documentation

## 2021-09-16 DIAGNOSIS — E1122 Type 2 diabetes mellitus with diabetic chronic kidney disease: Secondary | ICD-10-CM | POA: Diagnosis not present

## 2021-09-16 DIAGNOSIS — Z905 Acquired absence of kidney: Secondary | ICD-10-CM | POA: Insufficient documentation

## 2021-09-16 DIAGNOSIS — Z20822 Contact with and (suspected) exposure to covid-19: Secondary | ICD-10-CM | POA: Diagnosis not present

## 2021-09-16 DIAGNOSIS — E119 Type 2 diabetes mellitus without complications: Secondary | ICD-10-CM

## 2021-09-16 DIAGNOSIS — K838 Other specified diseases of biliary tract: Secondary | ICD-10-CM | POA: Diagnosis not present

## 2021-09-16 DIAGNOSIS — R1011 Right upper quadrant pain: Secondary | ICD-10-CM

## 2021-09-16 DIAGNOSIS — Z91040 Latex allergy status: Secondary | ICD-10-CM | POA: Diagnosis not present

## 2021-09-16 DIAGNOSIS — Z79899 Other long term (current) drug therapy: Secondary | ICD-10-CM | POA: Insufficient documentation

## 2021-09-16 DIAGNOSIS — I10 Essential (primary) hypertension: Secondary | ICD-10-CM | POA: Diagnosis present

## 2021-09-16 DIAGNOSIS — Z7901 Long term (current) use of anticoagulants: Secondary | ICD-10-CM | POA: Insufficient documentation

## 2021-09-16 DIAGNOSIS — R778 Other specified abnormalities of plasma proteins: Secondary | ICD-10-CM | POA: Diagnosis present

## 2021-09-16 DIAGNOSIS — R7989 Other specified abnormal findings of blood chemistry: Secondary | ICD-10-CM | POA: Diagnosis present

## 2021-09-16 LAB — COMPREHENSIVE METABOLIC PANEL
ALT: 24 U/L (ref 0–44)
AST: 47 U/L — ABNORMAL HIGH (ref 15–41)
Albumin: 4.4 g/dL (ref 3.5–5.0)
Alkaline Phosphatase: 61 U/L (ref 38–126)
Anion gap: 9 (ref 5–15)
BUN: 20 mg/dL (ref 8–23)
CO2: 29 mmol/L (ref 22–32)
Calcium: 10.5 mg/dL — ABNORMAL HIGH (ref 8.9–10.3)
Chloride: 104 mmol/L (ref 98–111)
Creatinine, Ser: 1.58 mg/dL — ABNORMAL HIGH (ref 0.44–1.00)
GFR, Estimated: 35 mL/min — ABNORMAL LOW (ref >60)
Glucose, Bld: 147 mg/dL — ABNORMAL HIGH (ref 70–99)
Potassium: 3.9 mmol/L (ref 3.5–5.1)
Sodium: 142 mmol/L (ref 135–145)
Total Bilirubin: 0.6 mg/dL (ref 0.3–1.2)
Total Protein: 7.5 g/dL (ref 6.5–8.1)

## 2021-09-16 LAB — TROPONIN I (HIGH SENSITIVITY)
Troponin I (High Sensitivity): 17 ng/L (ref <18)
Troponin I (High Sensitivity): 20 ng/L — ABNORMAL HIGH (ref <18)

## 2021-09-16 LAB — URINALYSIS, ROUTINE W REFLEX MICROSCOPIC
Bilirubin Urine: NEGATIVE
Glucose, UA: NEGATIVE mg/dL
Hgb urine dipstick: NEGATIVE
Ketones, ur: NEGATIVE mg/dL
Nitrite: NEGATIVE
Specific Gravity, Urine: 1.022 (ref 1.005–1.030)
pH: 6.5 (ref 5.0–8.0)

## 2021-09-16 LAB — CBC
HCT: 42.8 % (ref 36.0–46.0)
Hemoglobin: 13.4 g/dL (ref 12.0–15.0)
MCH: 26.3 pg (ref 26.0–34.0)
MCHC: 31.3 g/dL (ref 30.0–36.0)
MCV: 84.1 fL (ref 80.0–100.0)
Platelets: 260 10*3/uL (ref 150–400)
RBC: 5.09 MIL/uL (ref 3.87–5.11)
RDW: 14.5 % (ref 11.5–15.5)
WBC: 10.6 10*3/uL — ABNORMAL HIGH (ref 4.0–10.5)
nRBC: 0 % (ref 0.0–0.2)

## 2021-09-16 LAB — LIPASE, BLOOD: Lipase: 84 U/L — ABNORMAL HIGH (ref 11–51)

## 2021-09-16 MED ORDER — GADOBUTROL 1 MMOL/ML IV SOLN
9.0000 mL | Freq: Once | INTRAVENOUS | Status: AC | PRN
  Administered 2021-09-16: 9 mL via INTRAVENOUS

## 2021-09-16 MED ORDER — ONDANSETRON HCL 4 MG/2ML IJ SOLN
4.0000 mg | Freq: Once | INTRAMUSCULAR | Status: AC
  Administered 2021-09-16: 4 mg via INTRAVENOUS
  Filled 2021-09-16: qty 2

## 2021-09-16 MED ORDER — DEXTROSE 50 % IV SOLN
25.0000 mL | Freq: Once | INTRAVENOUS | Status: AC
  Administered 2021-09-16: 25 mL via INTRAVENOUS
  Filled 2021-09-16: qty 50

## 2021-09-16 NOTE — ED Notes (Signed)
Attempted to call [facility] charge nurse, no answer x2.

## 2021-09-16 NOTE — ED Provider Notes (Signed)
Patient seen on arrival from our affiliated facility, awaiting MRCP.  She requests additional antiemetics, and is provided.   Carmin Muskrat, MD 09/16/21 2042

## 2021-09-16 NOTE — ED Notes (Signed)
Pt's CBG 70 via her [facility], states she feels weak. Given 1/2 amp D50, will recheck CBG in 15 min.

## 2021-09-16 NOTE — ED Notes (Signed)
Pt arrives POV from [place] for MRI for gallstone eval. Pt currently denies abd pain, N/V/D. Pt has IV present, aware of plan of care.

## 2021-09-16 NOTE — ED Notes (Signed)
Patient transported to MRI 

## 2021-09-16 NOTE — ED Triage Notes (Signed)
Patient BIB [facility] from Home with ABD Pain.  Patient began to experience Pain to Epigastric ABD approximately 3 days PTA.   Worsened today. Moderate Nausea. No Vomiting. No Diarrhea. No Fevers.  NAD Noted during Triage. A&Ox4. GCS 15. Ambulatory.

## 2021-09-16 NOTE — ED Provider Notes (Signed)
[facility] Provider Note   CSN: [id number] Arrival date & time: 09/16/21  1412     History  Chief Complaint  Patient presents with   Abdominal Pain    Debra Jordan is a 71 y.o. female.  HPI     71 year old female with a history of diabetes, CKD with history of left nephrectomy, hypertension, hyperlipidemia, PCOS, seizure disorder who presents with concern for epigastric pain for 3 days with nausea.    3 days of epigastric abdominal pain For the last few days was worse after eating, radiating to the back Today low appetite, pain constant today up until arriving here has eased off some At worst pain was stabbing and burning Now is just sore, getting acid coming up in throat Nausea No vomiting No diarrhea, goes every 1-2 days, no change from new baseline, not really constipated, goes and is hard stool Had covid 7 months ago, had dairrhea and vomiting then but not now No fever  No urinary symptoms, only went a few times this AM, had some hesitancy No other CP. Has had some dyspnea since having COVID, no acute dyspnea today.  Covid long hauler.   Don't take ibuprofen regularly, takes aleve 2-3 times a week No etoh, other drugs  Past Medical History:  Diagnosis Date   ADD (attention deficit disorder)    Arthritis    gout   Blue toe syndrome ([place]) 10/06/2011   Broken shoulder    And tailbone    Cataract    Mixed form OU   Chronic kidney disease    hx of left nephrectomy   Diabetes mellitus    type 2   Diabetic retinopathy ([place])    NPDR OU   DM w/o complication type II, uncontrolled 09/06/2011   Fatty liver    Fibromyalgia    Gout 09/06/2011   History of headache    History of kidney stones    History of nephrectomy, unilateral 09/06/2011   History of PCOS    History of venous thrombosis and embolism    Left great toe   Hyperlipidemia    Hypersomnolence    Hypertension    Hypertensive retinopathy    OU   Hypothyroid 09/06/2011    Hypothyroidism    Ischemia of extremity 09/06/2011   Migraine headache 09/06/2011   Neuromuscular disorder (HCC)    neuropathy to feet bilateral   Obesity    Seizure disorder ([place])    Trouble swallowing     Past Surgical History:  Procedure Laterality Date   ABDOMINAL HYSTERECTOMY     HERNIA REPAIR     LYMPH NODE BIOPSY     NASAL SINUS SURGERY     NEPHRECTOMY Left 2002   left nephrectomy   TONSILLECTOMY  1969   TUBAL LIGATION      Home Medications Prior to Admission medications   Not on File      Allergies    Statins, Norvasc [amlodipine], Penicillins, Sulfa antibiotics, and Latex    Review of Systems   Review of Systems See above Physical Exam Updated Vital Signs BP (!) 153/78    Pulse 71    Temp 98.1 F (36.7 C) (Oral)    Resp 17    Ht 5' 4" (1.626 m)    Wt 104.9 kg    SpO2 100%    BMI 39.70 kg/m  Physical Exam Vitals and nursing note reviewed.  Constitutional:      General: She is not in acute distress.  Appearance: She is well-developed. She is not diaphoretic.  HENT:     Head: Normocephalic and atraumatic.  Eyes:     Conjunctiva/sclera: Conjunctivae normal.  Cardiovascular:     Rate and Rhythm: Normal rate and regular rhythm.     Heart sounds: Normal heart sounds. No murmur heard.   No friction rub. No gallop.  Pulmonary:     Effort: Pulmonary effort is normal. No respiratory distress.     Breath sounds: Normal breath sounds. No wheezing or rales.  Abdominal:     General: There is no distension.     Palpations: Abdomen is soft.     Tenderness: There is abdominal tenderness in the right upper quadrant and epigastric area. There is no guarding. Positive signs include Murphy's sign.     Comments: Does report mild tenderness lower abdomen but reports that is unchanged from baseline  Musculoskeletal:        General: No tenderness.     Cervical back: Normal range of motion.  Skin:    General: Skin is warm and dry.     Findings: No erythema or rash.   Neurological:     Mental Status: She is alert and oriented to person, place, and time.    ED Results / Procedures / Treatments   Labs (all labs ordered are listed, but only abnormal results are displayed) Labs Reviewed  LIPASE, BLOOD - Abnormal; Notable for the following components:      Result Value   Lipase 84 (*)    All other components within normal limits  COMPREHENSIVE METABOLIC PANEL - Abnormal; Notable for the following components:   Glucose, Bld 147 (*)    Creatinine, Ser 1.58 (*)    Calcium 10.5 (*)    AST 47 (*)    GFR, Estimated 35 (*)    All other components within normal limits  CBC - Abnormal; Notable for the following components:   WBC 10.6 (*)    All other components within normal limits  URINALYSIS, ROUTINE W REFLEX MICROSCOPIC - Abnormal; Notable for the following components:   APPearance HAZY (*)    Protein, ur TRACE (*)    Leukocytes,Ua MODERATE (*)    Non Squamous Epithelial 0-5 (*)    All other components within normal limits  COMPREHENSIVE METABOLIC PANEL - Abnormal; Notable for the following components:   Glucose, Bld 124 (*)    Creatinine, Ser 1.63 (*)    GFR, Estimated 34 (*)    All other components within normal limits  CBG MONITORING, ED - Abnormal; Notable for the following components:   Glucose-Capillary 135 (*)    All other components within normal limits  TROPONIN I (HIGH SENSITIVITY) - Abnormal; Notable for the following components:   Troponin I (High Sensitivity) 20 (*)    All other components within normal limits  TROPONIN I (HIGH SENSITIVITY) - Abnormal; Notable for the following components:   Troponin I (High Sensitivity) 18 (*)    All other components within normal limits  RESP PANEL BY RT-PCR (FLU A&B, COVID) ARPGX2  MAGNESIUM  CBC WITH DIFFERENTIAL/PLATELET  HIV ANTIBODY (ROUTINE TESTING W REFLEX)  TROPONIN I (HIGH SENSITIVITY)    EKG EKG Interpretation  Date/Time:  Thursday September 16 2021 14:44:00 EST Ventricular Rate:   79 PR Interval:  142 QRS Duration: 80 QT Interval:  380 QTC Calculation: 435 R Axis:   -32 Text Interpretation: Normal sinus rhythm Left axis deviation Minimal voltage criteria for LVH, may be normal variant ( R in aVL )  T wave abnormality, consider lateral ischemia Abnormal ECG When compared with ECG of 05-Sep-2011 19:02, No significant change was found Similar to 2013 tracing Confirmed by Nanda Quinton [phone]) on 09/16/2021 2:53:18 PM  Radiology MR ABDOMEN MRCP W WO CONTAST  Result Date: 09/17/2021 CLINICAL DATA:  71 year old female with clinically suspected biliary tract obstruction. EXAM: MRI ABDOMEN WITHOUT AND WITH CONTRAST (INCLUDING MRCP) TECHNIQUE: Multiplanar multisequence MR imaging of the abdomen was performed both before and after the administration of intravenous contrast. Heavily T2-weighted images of the biliary and pancreatic ducts were obtained, and three-dimensional MRCP images were rendered by post processing. CONTRAST:  79m GADAVIST GADOBUTROL 1 MMOL/ML IV SOLN COMPARISON:  No priors. FINDINGS: Lower chest: Unremarkable. Hepatobiliary: Diffuse loss of signal intensity on out of phase dual echo images, indicative of severe hepatic steatosis. Some areas of focal fatty sparing are noted, most evident adjacent to the gallbladder fossa. No suspicious cystic or solid hepatic lesions. No intrahepatic biliary ductal dilatation. Common bile duct is mildly dilated measuring up to 9 mm in the porta hepatis. However, there are no filling defects within the common bile duct to suggest the presence of choledocholithiasis. Small filling defects lying dependently in the gallbladder compatible with tiny gallstones. Gallbladder is not distended. Gallbladder wall thickness is normal. No gallbladder wall thickening or pericholecystic fluid or inflammatory changes. Pancreas: No solid pancreatic mass. No pancreatic ductal dilatation noted on MRCP images. In the posterior aspect of the pancreatic head there  are 2 adjacent T1 hypointense, T2 hyperintense, nonenhancing lesions, without definite communication with the adjacent pancreatic duct, largest of which measures 1.4 cm (coronal image 25 of series 5). No other peripancreatic fluid collections or inflammatory changes. Spleen:  Unremarkable. Adrenals/Urinary Tract: Left kidney is not visualized, either surgically or congenitally absent. Right kidney and bilateral adrenal glands are normal in appearance. No right hydroureteronephrosis. Stomach/Bowel: Visualized portions are unremarkable. Vascular/Lymphatic: No aneurysm identified in the visualized abdominal vasculature. No lymphadenopathy noted in the abdomen. Other: No significant volume of ascites noted in the visualized portions of the peritoneal cavity. Musculoskeletal: No aggressive appearing osseous lesions are noted in the visualized portions of the skeleton. IMPRESSION: 1. Study is positive for cholelithiasis, but there is no evidence of acute cholecystitis at this time. 2. Very mild common bile duct dilatation measuring up to 9 mm in the porta hepatis. No intrahepatic biliary ductal dilatation to suggest true biliary tract obstruction at this time. Additionally, there is no evidence of obstructing pancreatic head mass or choledocholithiasis. 3. Severe hepatic steatosis. 4. Small cystic lesions in the posterior aspect of the head of the pancreas, which have a benign appearance, favored to represent tiny pancreatic pseudocysts or side branch IPMN (intraductal papillary mucinous neoplasm). Recommend follow up pre and post contrast MRI/MRCP or pancreatic protocol CT in 2 years. This recommendation follows ACR consensus guidelines: Management of Incidental Pancreatic Cysts: A White Paper of the ACR Incidental Findings Committee. [id number];46:[id number] Electronically Signed   By: DVinnie LangtonM.D.   On: 09/17/2021 05:20   [place]domen Limited RUQ (LIVER/GB)  Result Date: 09/16/2021 CLINICAL DATA:   Right upper quadrant pain for 3 days. EXAM: ULTRASOUND ABDOMEN LIMITED RIGHT UPPER QUADRANT COMPARISON:  CT abdomen and pelvis 05/27/2016 FINDINGS: The examination was limited by patient body habitus and overlying bowel gas. Gallbladder: Multiple tiny gallstones. No gallbladder wall thickening. No sonographic Murphy sign noted by sonographer. Common bile duct: Diameter: 12 mm Liver: Diffusely increased parenchymal echogenicity without a focal lesion identified. Portal vein  is patent on color Doppler imaging with normal direction of blood flow towards the liver. Other: None. IMPRESSION: 1. Cholelithiasis without evidence of acute cholecystitis. 2. Dilated common bile duct. 3. Echogenic liver, nonspecific but compatible with steatosis. Electronically Signed   By: Logan Bores M.D.   On: 09/16/2021 17:37    Procedures Procedures    Medications Ordered in ED Medications  naloxone (NARCAN) injection 0.4 mg (has no administration in time range)  fentaNYL (SUBLIMAZE) injection 50 mcg (has no administration in time range)  ondansetron (ZOFRAN) injection 4 mg (has no administration in time range)  acetaminophen (TYLENOL) tablet 650 mg (has no administration in time range)    Or  acetaminophen (TYLENOL) suppository 650 mg (has no administration in time range)  insulin glargine-yfgn (SEMGLEE) injection 10 Units (10 Units Subcutaneous Given 09/17/21 0939)  insulin aspart (novoLOG) injection 0-9 Units (1 Units Subcutaneous Given 09/17/21 1155)  lactated ringers infusion ( Intravenous New Bag/Given 09/17/21 0708)  pantoprazole (PROTONIX) injection 40 mg (40 mg Intravenous Given 09/17/21 0939)  amLODipine (NORVASC) tablet 5 mg (has no administration in time range)  labetalol (NORMODYNE) tablet 100 mg (100 mg Oral Given 09/17/21 0939)  ondansetron (ZOFRAN) injection 4 mg (4 mg Intravenous Given 09/16/21 2054)  dextrose 50 % solution 25 mL (25 mLs Intravenous Given 09/16/21 2125)  gadobutrol (GADAVIST) 1 MMOL/ML  injection 9 mL (9 mLs Intravenous Contrast Given 09/16/21 2357)    ED Course/ Medical Decision Making/ A&P                           Medical Decision Making Amount and/or Complexity of Data Reviewed Labs: ordered. Decision-making details documented in ED Course. Radiology: ordered and independent interpretation performed. Decision-making details documented in ED Course.  Risk Prescription drug management. Decision regarding hospitalization.    71 year old female with a history of diabetes, CKD with history of left nephrectomy, hypertension, hyperlipidemia, PCOS, seizure disorder who presents with concern for epigastric pain for 3 days with nausea.    DDx includes appendicitis, pancreatitis, cholecystitis, pyelonephritis, AAA, SBO, nephrolithiasis, diverticulitis, PID, ovarian torsion, and tuboovarian abscess.  Has no new lower abdominal pain on exam and only epigastric pain on history, and have highest suspicion for biliary pathology given history of pain worsening with eating, location of pain and tenderness.  Normal pulses bilaterally. Epigastric pain and ACS considered with ECG obtained without acute abnormalities and troponin negative.   RUQ ordered and reviewed showing cholelithiasis with biliary ductal dilation of 64m Labs do not show significant transaminitis, no elevation in alk phos or bilirubin, lipase mildly elevated without meeting criteria for pancreatitis. Low suspicion for appendicitis, diverticuliits, nephrolithiasis in setting of cholelithiasis with epigastric pain.  Discussed CBD dilation with Dr. SMichail SermonGI who recommends MRCP.    This has been ordered and [facility].    Will transfer for symptomatic cholelithiasis and evaluation for choledocolithiasis No fever, wbc 10 and have low suspicion for cholangitis at this time.         Final Clinical Impression(s) / ED Diagnoses Final diagnoses:  RUQ abdominal pain  Common  bile duct dilatation  Calculus of gallbladder with biliary obstruction but without cholecystitis    Rx / DC Orders ED Discharge Orders          Ordered    Discharge patient        09/17/21 1149  Gareth Morgan, MD 09/17/21 1229

## 2021-09-17 ENCOUNTER — Encounter (HOSPITAL_COMMUNITY): Payer: Self-pay | Admitting: Internal Medicine

## 2021-09-17 ENCOUNTER — Observation Stay (HOSPITAL_COMMUNITY): Payer: Medicare Other

## 2021-09-17 DIAGNOSIS — R1013 Epigastric pain: Secondary | ICD-10-CM | POA: Diagnosis not present

## 2021-09-17 DIAGNOSIS — K838 Other specified diseases of biliary tract: Secondary | ICD-10-CM | POA: Diagnosis not present

## 2021-09-17 DIAGNOSIS — R7989 Other specified abnormal findings of blood chemistry: Secondary | ICD-10-CM

## 2021-09-17 DIAGNOSIS — E119 Type 2 diabetes mellitus without complications: Secondary | ICD-10-CM

## 2021-09-17 DIAGNOSIS — I10 Essential (primary) hypertension: Secondary | ICD-10-CM

## 2021-09-17 DIAGNOSIS — R778 Other specified abnormalities of plasma proteins: Secondary | ICD-10-CM | POA: Diagnosis present

## 2021-09-17 DIAGNOSIS — Z794 Long term (current) use of insulin: Secondary | ICD-10-CM

## 2021-09-17 HISTORY — DX: Epigastric pain: R10.13

## 2021-09-17 HISTORY — DX: Hypercalcemia: E83.52

## 2021-09-17 HISTORY — DX: Other specified abnormal findings of blood chemistry: R79.89

## 2021-09-17 LAB — TROPONIN I (HIGH SENSITIVITY): Troponin I (High Sensitivity): 18 ng/L — ABNORMAL HIGH (ref <18)

## 2021-09-17 LAB — CBC WITH DIFFERENTIAL/PLATELET
Abs Immature Granulocytes: 0.02 10*3/uL (ref 0.00–0.07)
Basophils Absolute: 0.1 10*3/uL (ref 0.0–0.1)
Basophils Relative: 1 %
Eosinophils Absolute: 0.3 10*3/uL (ref 0.0–0.5)
Eosinophils Relative: 4 %
HCT: 41.1 % (ref 36.0–46.0)
Hemoglobin: 13 g/dL (ref 12.0–15.0)
Immature Granulocytes: 0 %
Lymphocytes Relative: 30 %
Lymphs Abs: 2.2 10*3/uL (ref 0.7–4.0)
MCH: 26.9 pg (ref 26.0–34.0)
MCHC: 31.6 g/dL (ref 30.0–36.0)
MCV: 85.1 fL (ref 80.0–100.0)
Monocytes Absolute: 0.6 10*3/uL (ref 0.1–1.0)
Monocytes Relative: 8 %
Neutro Abs: 4.1 10*3/uL (ref 1.7–7.7)
Neutrophils Relative %: 57 %
Platelets: 246 10*3/uL (ref 150–400)
RBC: 4.83 MIL/uL (ref 3.87–5.11)
RDW: 14.4 % (ref 11.5–15.5)
WBC: 7.3 10*3/uL (ref 4.0–10.5)
nRBC: 0 % (ref 0.0–0.2)

## 2021-09-17 LAB — ECHOCARDIOGRAM COMPLETE
Area-P 1/2: 2.99 cm2
Height: 64 in
S' Lateral: 3.1 cm
Weight: 3700.2 oz

## 2021-09-17 LAB — COMPREHENSIVE METABOLIC PANEL
ALT: 24 U/L (ref 0–44)
AST: 32 U/L (ref 15–41)
Albumin: 3.7 g/dL (ref 3.5–5.0)
Alkaline Phosphatase: 51 U/L (ref 38–126)
Anion gap: 7 (ref 5–15)
BUN: 16 mg/dL (ref 8–23)
CO2: 23 mmol/L (ref 22–32)
Calcium: 9.5 mg/dL (ref 8.9–10.3)
Chloride: 110 mmol/L (ref 98–111)
Creatinine, Ser: 1.63 mg/dL — ABNORMAL HIGH (ref 0.44–1.00)
GFR, Estimated: 34 mL/min — ABNORMAL LOW (ref >60)
Glucose, Bld: 124 mg/dL — ABNORMAL HIGH (ref 70–99)
Potassium: 3.9 mmol/L (ref 3.5–5.1)
Sodium: 140 mmol/L (ref 135–145)
Total Bilirubin: 0.5 mg/dL (ref 0.3–1.2)
Total Protein: 6.7 g/dL (ref 6.5–8.1)

## 2021-09-17 LAB — RESP PANEL BY RT-PCR (FLU A&B, COVID) ARPGX2
Influenza A by PCR: NEGATIVE
Influenza B by PCR: NEGATIVE
SARS Coronavirus 2 by RT PCR: NEGATIVE

## 2021-09-17 LAB — CBG MONITORING, ED: Glucose-Capillary: 135 mg/dL — ABNORMAL HIGH (ref 70–99)

## 2021-09-17 LAB — MAGNESIUM: Magnesium: 1.8 mg/dL (ref 1.7–2.4)

## 2021-09-17 LAB — HIV ANTIBODY (ROUTINE TESTING W REFLEX): HIV Screen 4th Generation wRfx: NONREACTIVE

## 2021-09-17 MED ORDER — LABETALOL HCL 200 MG PO TABS
100.0000 mg | ORAL_TABLET | Freq: Two times a day (BID) | ORAL | Status: DC
  Administered 2021-09-17: 100 mg via ORAL
  Filled 2021-09-17: qty 1

## 2021-09-17 MED ORDER — INSULIN ASPART 100 UNIT/ML IJ SOLN
0.0000 [IU] | Freq: Four times a day (QID) | INTRAMUSCULAR | Status: DC
  Administered 2021-09-17: 1 [IU] via SUBCUTANEOUS

## 2021-09-17 MED ORDER — NALOXONE HCL 0.4 MG/ML IJ SOLN: 0.4000 mg | INTRAMUSCULAR | Status: DC | PRN

## 2021-09-17 MED ORDER — AMLODIPINE BESYLATE 5 MG PO TABS: 5.0000 mg | ORAL_TABLET | Freq: Every day | ORAL | Status: DC

## 2021-09-17 MED ORDER — ACETAMINOPHEN 325 MG PO TABS: 650.0000 mg | ORAL_TABLET | Freq: Four times a day (QID) | ORAL | Status: DC | PRN

## 2021-09-17 MED ORDER — INSULIN GLARGINE-YFGN 100 UNIT/ML ~~LOC~~ SOLN
10.0000 [IU] | Freq: Two times a day (BID) | SUBCUTANEOUS | Status: DC
  Administered 2021-09-17: 10 [IU] via SUBCUTANEOUS
  Filled 2021-09-17 (×3): qty 0.1

## 2021-09-17 MED ORDER — LACTATED RINGERS IV SOLN: INTRAVENOUS | Status: DC

## 2021-09-17 MED ORDER — PANTOPRAZOLE SODIUM 40 MG IV SOLR
40.0000 mg | Freq: Every day | INTRAVENOUS | Status: DC
  Administered 2021-09-17: 40 mg via INTRAVENOUS
  Filled 2021-09-17: qty 40

## 2021-09-17 MED ORDER — FENTANYL CITRATE PF 50 MCG/ML IJ SOSY: 50.0000 ug | PREFILLED_SYRINGE | INTRAMUSCULAR | Status: DC | PRN

## 2021-09-17 MED ORDER — ONDANSETRON HCL 4 MG/2ML IJ SOLN: 4.0000 mg | Freq: Four times a day (QID) | INTRAMUSCULAR | Status: DC | PRN

## 2021-09-17 MED ORDER — ACETAMINOPHEN 650 MG RE SUPP: 650.0000 mg | Freq: Four times a day (QID) | RECTAL | Status: DC | PRN

## 2021-09-17 NOTE — ED Notes (Signed)
Pt ambulatory to waiting room. Pt verbalized understanding of discharge instructions.   

## 2021-09-17 NOTE — ED Notes (Signed)
Pt given ginger ale and apple sauce.

## 2021-09-17 NOTE — Discharge Summary (Signed)
Physician Discharge Summary  Debra Jordan RSW:[id number] DOB: 08-09-1951 DOA: 09/16/2021  PCP: Shon Baton, MD  Admit date: 09/16/2021 Discharge date: 09/17/2021  Admitted From: Home Disposition: Home  Recommendations for Outpatient Follow-up:  Follow up with PCP in 1-2 weeks Please obtain BMP/CBC in one week Please follow up with general surgery as discussed  Home Health: None Equipment/Devices: None  Discharge Condition: Stable CODE STATUS: Full Diet recommendation: Soft low-fat bland diet as discussed  Brief/Interim Summary: Debra Jordan is a 71 y.o. female with medical history significant for type 2 diabetes mellitus, essential pretension, GERD, who presented with transient episode of epigastric pain with questionable common bile duct dilation on imaging.  Repeat imaging with MRCP per GI recommendations shows no biliary obstruction, common bile duct dilation appears to be resolving.  Presumption is patient had passed previous gallstone which would cause her symptoms.  We discussed consulting general surgery given her multiple gallstones she is at risk for recurrence.  Patient requested outpatient follow-up given her current social situation at home where she cares for her daughter which is certainly reasonable given patient's normal labs and resolved symptoms.  We discussed that should her pain or symptoms recur or worsen she should immediately report back to the ED for further follow-up.  Patient otherwise reasonable and agreeable for discharge home.  Of note patient's home medication list has not been updated at the time of discharge, no medication changes were made during this hospitalization.   Discharge Instructions  Discharge Instructions     Discharge patient   Complete by: As directed    Discharge disposition: 01-Home or Self Care   Discharge patient date: 09/17/2021         Follow-up Information     Go to the [facility] Emergency Department for MRCP.                  Allergies  Allergen Reactions   Statins Other (See Comments)    Severe myalgia and abdominal cramps   Norvasc [Amlodipine]     Difficulty breathing    Penicillins    Sulfa Antibiotics Nausea Only   Latex Rash    Occasional SOB    Consultations: GI   Procedures/Studies: MR ABDOMEN MRCP W WO CONTAST  Result Date: 09/17/2021 CLINICAL DATA:  71 year old female with clinically suspected biliary tract obstruction. EXAM: MRI ABDOMEN WITHOUT AND WITH CONTRAST (INCLUDING MRCP) TECHNIQUE: Multiplanar multisequence MR imaging of the abdomen was performed both before and after the administration of intravenous contrast. Heavily T2-weighted images of the biliary and pancreatic ducts were obtained, and three-dimensional MRCP images were rendered by post processing. CONTRAST:  71mL GADAVIST GADOBUTROL 1 MMOL/ML IV SOLN COMPARISON:  No priors. FINDINGS: Lower chest: Unremarkable. Hepatobiliary: Diffuse loss of signal intensity on out of phase dual echo images, indicative of severe hepatic steatosis. Some areas of focal fatty sparing are noted, most evident adjacent to the gallbladder fossa. No suspicious cystic or solid hepatic lesions. No intrahepatic biliary ductal dilatation. Common bile duct is mildly dilated measuring up to 9 mm in the porta hepatis. However, there are no filling defects within the common bile duct to suggest the presence of choledocholithiasis. Small filling defects lying dependently in the gallbladder compatible with tiny gallstones. Gallbladder is not distended. Gallbladder wall thickness is normal. No gallbladder wall thickening or pericholecystic fluid or inflammatory changes. Pancreas: No solid pancreatic mass. No pancreatic ductal dilatation noted on MRCP images. In the posterior aspect of the pancreatic head there are 2 adjacent  T1 hypointense, T2 hyperintense, nonenhancing lesions, without definite communication with the adjacent pancreatic duct, largest of which  measures 1.4 cm (coronal image 25 of series 5). No other peripancreatic fluid collections or inflammatory changes. Spleen:  Unremarkable. Adrenals/Urinary Tract: Left kidney is not visualized, either surgically or congenitally absent. Right kidney and bilateral adrenal glands are normal in appearance. No right hydroureteronephrosis. Stomach/Bowel: Visualized portions are unremarkable. Vascular/Lymphatic: No aneurysm identified in the visualized abdominal vasculature. No lymphadenopathy noted in the abdomen. Other: No significant volume of ascites noted in the visualized portions of the peritoneal cavity. Musculoskeletal: No aggressive appearing osseous lesions are noted in the visualized portions of the skeleton. IMPRESSION: 1. Study is positive for cholelithiasis, but there is no evidence of acute cholecystitis at this time. 2. Very mild common bile duct dilatation measuring up to 9 mm in the porta hepatis. No intrahepatic biliary ductal dilatation to suggest true biliary tract obstruction at this time. Additionally, there is no evidence of obstructing pancreatic head mass or choledocholithiasis. 3. Severe hepatic steatosis. 4. Small cystic lesions in the posterior aspect of the head of the pancreas, which have a benign appearance, favored to represent tiny pancreatic pseudocysts or side branch IPMN (intraductal papillary mucinous neoplasm). Recommend follow up pre and post contrast MRI/MRCP or pancreatic protocol CT in 2 years. This recommendation follows ACR consensus guidelines: Management of Incidental Pancreatic Cysts: A White Paper of the ACR Incidental Findings Committee. [place] [id number]. Electronically Signed   By: Vinnie Langton M.D.   On: 09/17/2021 05:20   US Abdomen Limited RUQ (LIVER/GB)  Result Date: 09/16/2021 CLINICAL DATA:  Right upper quadrant pain for 3 days. EXAM: ULTRASOUND ABDOMEN LIMITED RIGHT UPPER QUADRANT COMPARISON:  CT abdomen and pelvis 05/27/2016 FINDINGS: The  examination was limited by patient body habitus and overlying bowel gas. Gallbladder: Multiple tiny gallstones. No gallbladder wall thickening. No sonographic Murphy sign noted by sonographer. Common bile duct: Diameter: 12 mm Liver: Diffusely increased parenchymal echogenicity without a focal lesion identified. Portal vein is patent on color Doppler imaging with normal direction of blood flow towards the liver. Other: None. IMPRESSION: 1. Cholelithiasis without evidence of acute cholecystitis. 2. Dilated common bile duct. 3. Echogenic liver, nonspecific but compatible with steatosis. Electronically Signed   By: Logan Bores M.D.   On: 09/16/2021 17:37     Subjective: No acute issues or events this morning, abdominal pain resolved no further episodes of epigastric pain nausea vomiting diarrhea constipation headache fevers chills chest pain shortness of breath.   Discharge Exam: Vitals:   09/17/21 0800 09/17/21 0930  BP: (!) 166/90 (!) 155/97  Pulse: 82 79  Resp: 18   Temp:    SpO2: 96% 98%   Vitals:   09/17/21 0515 09/17/21 0615 09/17/21 0800 09/17/21 0930  BP: (!) 159/84 (!) 174/87 (!) 166/90 (!) 155/97  Pulse: 70 74 82 79  Resp: 15  18   Temp:      TempSrc:      SpO2: 99% 100% 96% 98%  Weight:      Height:        General: Pt is alert, awake, not in acute distress Cardiovascular: RRR, S1/S2 +, no rubs, no gallops Respiratory: CTA bilaterally, no wheezing, no rhonchi Abdominal: Soft, NT, ND, bowel sounds + Extremities: no edema, no cyanosis    The results of significant diagnostics from this hospitalization (including imaging, microbiology, ancillary and laboratory) are listed below for reference.     Microbiology: Recent Results (from the past  240 hour(s))  Resp Panel by RT-PCR (Flu A&B, Covid) Nasopharyngeal Swab     Status: None   Collection Time: 09/17/21  4:09 AM   Specimen: Nasopharyngeal Swab; Nasopharyngeal(NP) swabs in vial transport medium  Result Value Ref Range  Status   SARS Coronavirus 2 by RT PCR NEGATIVE NEGATIVE Final    Comment: (NOTE) SARS-CoV-2 target nucleic acids are NOT DETECTED.  The SARS-CoV-2 RNA is generally detectable in upper respiratory specimens during the acute phase of infection. The lowest concentration of SARS-CoV-2 viral copies this assay can detect is 138 copies/mL. A negative result does not preclude SARS-Cov-2 infection and should not be used as the sole basis for treatment or other patient management decisions. A negative result may occur with  improper specimen collection/handling, submission of specimen other than nasopharyngeal swab, presence of viral mutation(s) within the areas targeted by this assay, and inadequate number of viral copies(<138 copies/mL). A negative result must be combined with clinical observations, patient history, and epidemiological information. The expected result is Negative.  Fact Sheet for Patients:  EntrepreneurPulse.com.au  Fact Sheet for Healthcare Providers:  IncredibleEmployment.be  This test is no t yet approved or cleared by the [place] FDA and  has been authorized for detection and/or diagnosis of SARS-CoV-2 by FDA under an Emergency Use Authorization (EUA). This EUA will remain  in effect (meaning this test can be used) for the duration of the COVID-19 declaration under Section 564(b)(1) of the Act, 21 U.S.C.section 360bbb-3(b)(1), unless the authorization is terminated  or revoked sooner.       Influenza A by PCR NEGATIVE NEGATIVE Final   Influenza B by PCR NEGATIVE NEGATIVE Final    Comment: (NOTE) The Xpert Xpress SARS-CoV-2/FLU/RSV plus assay is intended as an aid in the diagnosis of influenza from Nasopharyngeal swab specimens and should not be used as a sole basis for treatment. Nasal washings and aspirates are unacceptable for Xpert Xpress SARS-CoV-2/FLU/RSV testing.  Fact Sheet for  Patients: EntrepreneurPulse.com.au  Fact Sheet for Healthcare Providers: IncredibleEmployment.be  This test is not yet approved or cleared by the [place] FDA and has been authorized for detection and/or diagnosis of SARS-CoV-2 by FDA under an Emergency Use Authorization (EUA). This EUA will remain in effect (meaning this test can be used) for the duration of the COVID-19 declaration under Section 564(b)(1) of the Act, 21 U.S.C. section 360bbb-3(b)(1), unless the authorization is terminated or revoked.  Performed at [facility], [place] [address] [place], [place] [address]      Labs: BNP (last 3 results) No results for input(s): BNP in the last 8760 hours. Basic Metabolic Panel: Recent Labs  Lab 09/16/21 1449 09/17/21 0500  NA 142 140  K 3.9 3.9  CL 104 110  CO2 29 23  GLUCOSE 147* 124*  BUN 20 16  CREATININE 1.58* 1.63*  CALCIUM 10.5* 9.5  MG  --  1.8   Liver Function Tests: Recent Labs  Lab 09/16/21 1449 09/17/21 0500  AST 47* 32  ALT 24 24  ALKPHOS 61 51  BILITOT 0.6 0.5  PROT 7.5 6.7  ALBUMIN 4.4 3.7   Recent Labs  Lab 09/16/21 1449  LIPASE 84*   No results for input(s): AMMONIA in the last 168 hours. CBC: Recent Labs  Lab 09/16/21 1449 09/17/21 0500  WBC 10.6* 7.3  NEUTROABS  --  4.1  HGB 13.4 13.0  HCT 42.8 41.1  MCV 84.1 85.1  PLT 260 246   Cardiac Enzymes: No results for input(s):  CKTOTAL, CKMB, CKMBINDEX, TROPONINI in the last 168 hours. BNP: Invalid input(s): POCBNP CBG: Recent Labs  Lab 09/17/21 1149  GLUCAP 135*   D-Dimer No results for input(s): DDIMER in the last 72 hours. Hgb A1c No results for input(s): HGBA1C in the last 72 hours. Lipid Profile No results for input(s): CHOL, HDL, LDLCALC, TRIG, CHOLHDL, LDLDIRECT in the last 72 hours. Thyroid function studies No results for input(s): TSH, T4TOTAL, T3FREE, THYROIDAB in the last 72 hours.  Invalid input(s): FREET3 Anemia  work up No results for input(s): VITAMINB12, FOLATE, FERRITIN, TIBC, IRON, RETICCTPCT in the last 72 hours. Urinalysis    Component Value Date/Time   COLORURINE YELLOW 09/16/2021 1603   APPEARANCEUR HAZY (A) 09/16/2021 1603   LABSPEC 1.022 09/16/2021 1603   PHURINE 6.5 09/16/2021 1603   GLUCOSEU NEGATIVE 09/16/2021 1603   HGBUR NEGATIVE 09/16/2021 1603   BILIRUBINUR NEGATIVE 09/16/2021 1603   KETONESUR NEGATIVE 09/16/2021 1603   PROTEINUR TRACE (A) 09/16/2021 1603   UROBILINOGEN 0.2 09/07/2011 1240   NITRITE NEGATIVE 09/16/2021 1603   LEUKOCYTESUR MODERATE (A) 09/16/2021 1603   Sepsis Labs Invalid input(s): PROCALCITONIN,  WBC,  LACTICIDVEN Microbiology Recent Results (from the past 240 hour(s))  Resp Panel by RT-PCR (Flu A&B, Covid) Nasopharyngeal Swab     Status: None   Collection Time: 09/17/21  4:09 AM   Specimen: Nasopharyngeal Swab; Nasopharyngeal(NP) swabs in vial transport medium  Result Value Ref Range Status   SARS Coronavirus 2 by RT PCR NEGATIVE NEGATIVE Final    Comment: (NOTE) SARS-CoV-2 target nucleic acids are NOT DETECTED.  The SARS-CoV-2 RNA is generally detectable in upper respiratory specimens during the acute phase of infection. The lowest concentration of SARS-CoV-2 viral copies this assay can detect is 138 copies/mL. A negative result does not preclude SARS-Cov-2 infection and should not be used as the sole basis for treatment or other patient management decisions. A negative result may occur with  improper specimen collection/handling, submission of specimen other than nasopharyngeal swab, presence of viral mutation(s) within the areas targeted by this assay, and inadequate number of viral copies(<138 copies/mL). A negative result must be combined with clinical observations, patient history, and epidemiological information. The expected result is Negative.  Fact Sheet for Patients:  EntrepreneurPulse.com.au  Fact Sheet for  Healthcare Providers:  IncredibleEmployment.be  This test is no t yet approved or cleared by the [place] FDA and  has been authorized for detection and/or diagnosis of SARS-CoV-2 by FDA under an Emergency Use Authorization (EUA). This EUA will remain  in effect (meaning this test can be used) for the duration of the COVID-19 declaration under Section 564(b)(1) of the Act, 21 U.S.C.section 360bbb-3(b)(1), unless the authorization is terminated  or revoked sooner.       Influenza A by PCR NEGATIVE NEGATIVE Final   Influenza B by PCR NEGATIVE NEGATIVE Final    Comment: (NOTE) The Xpert Xpress SARS-CoV-2/FLU/RSV plus assay is intended as an aid in the diagnosis of influenza from Nasopharyngeal swab specimens and should not be used as a sole basis for treatment. Nasal washings and aspirates are unacceptable for Xpert Xpress SARS-CoV-2/FLU/RSV testing.  Fact Sheet for Patients: EntrepreneurPulse.com.au  Fact Sheet for Healthcare Providers: IncredibleEmployment.be  This test is not yet approved or cleared by the [place] FDA and has been authorized for detection and/or diagnosis of SARS-CoV-2 by FDA under an Emergency Use Authorization (EUA). This EUA will remain in effect (meaning this test can be used) for the duration of the COVID-19 declaration under  Section 564(b)(1) of the Act, 21 U.S.C. section 360bbb-3(b)(1), unless the authorization is terminated or revoked.  Performed at [facility], [place] [address] [place], [place] [address]      Time coordinating discharge: Over 30 minutes  SIGNED:   Little Ishikawa, DO [facility] 09/17/2021, 11:51 AM Pager   If 7PM-7AM, please contact night-coverage [email or web address]

## 2021-09-17 NOTE — Care Management Obs Status (Signed)
[place] NOTIFICATION   Patient Details  Name: MINNA DUMIRE MRN: [id number] Date of Birth: [birth year]   Medicare Observation Status Notification Given:  Yes    Fuller Mandril, RN 09/17/2021, 11:31 AM

## 2021-09-17 NOTE — ED Notes (Signed)
Pt tolerated ginger ale and apple sauce. Denies nausea and vomiting.

## 2021-09-17 NOTE — H&P (Signed)
History and Physical    PLEASE NOTE THAT DRAGON DICTATION SOFTWARE WAS USED IN THE CONSTRUCTION OF THIS NOTE.   Debra Jordan:[id number] DOB: December 01, 1950 DOA: 09/16/2021  PCP: Shon Baton, MD  Patient coming from: home   I have personally briefly reviewed patient's old medical records in [place]  Chief Complaint: Epigastric pain  HPI: Debra Jordan is a 72 y.o. female with medical history significant for type 2 diabetes mellitus, essential pretension, GERD, who is admitted to [facility] on 09/16/2021 with epigastric pain with common bile duct dilation after presenting from home to [facility] ED complaining of epigastric pain.   The patient reports 2 to 3 days of intermittent sharp epigastric discomfort, which appears to be exacerbated in a postprandial timeframe.  Reports associated intermittent nausea in the absence of any vomiting.  No recent trauma or travel.  Denies any associated diarrhea, melena, or hematochezia.  No associated subjective fever, chills, rigors, generalized myalgias.  Also denies any associated shortness of breath, palpitations, diaphoresis, dizziness, presyncope, or syncope.  Never previously experienced pain similar to this.  Denies any history of regular or recent alcohol consumption.  No recent unintentional weight loss.     ED Course:  Vital signs in the ED were notable for the following: Afebrile; heart rate 73-86; blood pressure 157/86; respiratory 15-20, oxygen saturation 97% on room air.  Labs were notable for the following: BMP notable for the following: Calcium 10.5, alkaline phosphatase 61, AST 47, ALT 24, total bilirubin 0.6.  Lipase 84.  High-sensitivity troponin I initially noted to be 17, with repeat value trending up to 20.  CBC notable for white cell count 10,600.  Urinalysis notable for white blood cell count 6-10, nitrate negative.  COVID-19/influenza PCR negative.  Imaging and additional notable ED work-up: EKG shows sinus rhythm  without evidence of acute images.  Right upper quadrant ultrasound showed cholelithiasis without evidence of acute cholecystitis, also demonstrating dilated common bile duct, measuring 12 mm, in the absence of any radiopaque choledocholithiasis.  EDP discussed the patient's case with the on-call [facility] gastroenterologist, Dr. Michail Sermon, Who recommended pursuit of MRCP, with plan for [facility] to formally consult.  While in the ED, the following were administered: Zofran 4 mg IV x1     Review of Systems: As per HPI otherwise 10 point review of systems negative.   Past Medical History:  Diagnosis Date   ADD (attention deficit disorder)    Arthritis    gout   Blue toe syndrome ([place]) 10/06/2011   Broken shoulder    And tailbone    Cataract    Mixed form OU   Chronic kidney disease    hx of left nephrectomy   Diabetes mellitus    type 2   Diabetic retinopathy ([place])    NPDR OU   DM w/o complication type II, uncontrolled 09/06/2011   Fatty liver    Fibromyalgia    Gout 09/06/2011   History of headache    History of kidney stones    History of nephrectomy, unilateral 09/06/2011   History of PCOS    History of venous thrombosis and embolism    Left great toe   Hyperlipidemia    Hypersomnolence    Hypertension    Hypertensive retinopathy    OU   Hypothyroid 09/06/2011   Hypothyroidism    Ischemia of extremity 09/06/2011   Migraine headache 09/06/2011   Neuromuscular disorder (HCC)    neuropathy to feet bilateral   Obesity  Seizure disorder ([place])    Trouble swallowing     Past Surgical History:  Procedure Laterality Date   ABDOMINAL HYSTERECTOMY     HERNIA REPAIR     LYMPH NODE BIOPSY     NASAL SINUS SURGERY     NEPHRECTOMY Left 2002   left nephrectomy   TONSILLECTOMY  1969   TUBAL LIGATION      Social History:  reports that she has never smoked. She has never used smokeless tobacco. She reports that she does not drink alcohol and does not use drugs.   Allergies   Allergen Reactions   Statins Other (See Comments)    Severe myalgia and abdominal cramps   Norvasc [Amlodipine]     Difficulty breathing    Penicillins    Sulfa Antibiotics Nausea Only   Latex Rash    Occasional SOB    Family History  Problem Relation Age of Onset   Other Mother        blood clot history, varicose veins   Stroke Mother    Diabetes Mother    Cancer Father    Stroke Maternal Grandmother    Healthy Brother    Healthy Brother    Healthy Brother    Diabetes Daughter     Family history reviewed and not pertinent    Prior to Admission medications   Medication Sig Start Date End Date Taking? Authorizing Provider  albuterol (VENTOLIN HFA) 108 (90 Base) MCG/ACT inhaler SMARTSIG:1-2 Puff(s) By Mouth Every 4 Hours PRN 07/10/20   [provider]  amLODipine (NORVASC) 5 MG tablet Take 5 mg by mouth at bedtime.  01/22/20   [provider]  Armodafinil 150 MG tablet Take 150 mg by mouth as directed. 1/2 tablet prn    [provider]  chlorthalidone (HYGROTON) 25 MG tablet Take 12.5 mg by mouth every morning. 12/25/20   [provider]  Cholecalciferol (VITAMIN D3 PO) Take by mouth daily.    [provider]  cyclobenzaprine (FLEXERIL) 10 MG tablet Take 10 mg by mouth at bedtime.    [provider]  DULoxetine (CYMBALTA) 60 MG capsule Take 1 capsule by mouth daily.    [provider]  ezetimibe (ZETIA) 10 MG tablet Take 1 tablet (10 mg total) by mouth daily after supper. 07/20/20 10/18/20  Adrian Prows, MD  fenofibrate 160 MG tablet Take 1 tablet (160 mg total) by mouth daily. 09/08/11 04/27/21  Shon Baton, MD  furosemide (LASIX) 20 MG tablet Take 1 tablet (20 mg total) by mouth daily as needed. 06/04/20   Adrian Prows, MD  insulin lispro (HUMALOG) 100 UNIT/ML injection Inject 3-15 Units into the skin 4 (four) times daily. Sliding scale    [provider]  Insulin Pen Needle (BD PEN NEEDLE NANO U/F) 32G X 4 MM MISC  Use 1 pen needle as directed with Rx of Humalog and Lantus Dx code-E11.51 06/08/10   [provider]  labetalol (NORMODYNE) 100 MG tablet Take 1 tablet (100 mg total) by mouth 2 (two) times daily. 07/20/20   Adrian Prows, MD  levothyroxine (SYNTHROID) 112 MCG tablet Take 1 tablet by mouth daily.    [provider]  losartan (COZAAR) 50 MG tablet Take 50 mg by mouth 2 (two) times daily.  08/04/14   [provider]  montelukast (SINGULAIR) 10 MG tablet Take 10 mg by mouth at bedtime. Patient not taking: No sig reported 07/10/20   [provider]  Multiple Vitamin (MULTIVITAMIN) tablet Take 1 tablet  by mouth daily.    [provider]  Multiple Vitamins-Minerals (CENTRUM SILVER 50+WOMEN) TABS See admin instructions.    [provider]  ondansetron (ZOFRAN) 4 MG tablet Take 4 mg by mouth 3 (three) times daily as needed. 01/30/20   [provider]  pantoprazole (PROTONIX) 40 MG tablet Take 40 mg by mouth daily.    [provider]  Pitavastatin Calcium 2 MG TABS Take 1 tablet (2 mg total) by mouth daily. Patient not taking: No sig reported 07/20/20   Adrian Prows, MD  rivaroxaban (XARELTO) 2.5 MG TABS tablet Take 2.5 mg by mouth 2 (two) times daily. XARLETO 09/08/11   Theda Sers, Susette Racer, PA-C  TOUJEO MAX SOLOSTAR 300 UNIT/ML Solostar Pen SMARTSIG:44 Unit(s) SUB-Q Twice Daily 07/13/20   [provider]     Objective    Physical Exam: Vitals:   09/17/21 0400 09/17/21 0430 09/17/21 0515 09/17/21 0615  BP: (!) 158/72 (!) 156/128 (!) 159/84 (!) 174/87  Pulse: 72 80 70 74  Resp: 18 18 15    Temp:      TempSrc:      SpO2: 99% 97% 99% 100%  Weight:      Height:        General: appears to be stated age; alert, oriented Skin: warm, dry, no rash Head:  AT/[place] Mouth:  Oral mucosa membranes appear moist, normal dentition Neck: supple; trachea midline Heart:  RRR; did not appreciate any M/R/G Lungs: CTAB, did not appreciate any  wheezes, rales, or rhonchi Abdomen: + BS; soft, ND, mild tenderness over the epigastrium in the absence of any associated guarding, rigidity, or rebound tenderness.  Overall, no evidence of acute peritoneal signs at this time. Vascular: 2+ pedal pulses b/l; 2+ radial pulses b/l Extremities: no peripheral edema, no muscle wasting Neuro: strength and sensation intact in upper and lower extremities b/l   Labs on Admission: I have personally reviewed following labs and imaging studies  CBC: Recent Labs  Lab 09/16/21 1449 09/17/21 0500  WBC 10.6* 7.3  NEUTROABS  --  4.1  HGB 13.4 13.0  HCT 42.8 41.1  MCV 84.1 85.1  PLT 260 607   Basic Metabolic Panel: Recent Labs  Lab 09/16/21 1449 09/17/21 0500  NA 142 140  K 3.9 3.9  CL 104 110  CO2 29 23  GLUCOSE 147* 124*  BUN 20 16  CREATININE 1.58* 1.63*  CALCIUM 10.5* 9.5  MG  --  1.8   GFR: Estimated Creatinine Clearance: 39 mL/min (A) (by C-G formula based on SCr of 1.63 mg/dL (H)). Liver Function Tests: Recent Labs  Lab 09/16/21 1449 09/17/21 0500  AST 47* 32  ALT 24 24  ALKPHOS 61 51  BILITOT 0.6 0.5  PROT 7.5 6.7  ALBUMIN 4.4 3.7   Recent Labs  Lab 09/16/21 1449  LIPASE 84*   No results for input(s): AMMONIA in the last 168 hours. Coagulation Profile: No results for input(s): INR, PROTIME in the last 168 hours. Cardiac Enzymes: No results for input(s): CKTOTAL, CKMB, CKMBINDEX, TROPONINI in the last 168 hours. BNP (last 3 results) No results for input(s): PROBNP in the last 8760 hours. HbA1C: No results for input(s): HGBA1C in the last 72 hours. CBG: No results for input(s): GLUCAP in the last 168 hours. Lipid Profile: No results for input(s): CHOL, HDL, LDLCALC, TRIG, CHOLHDL, LDLDIRECT in the last 72 hours. Thyroid Function Tests: No results for input(s): TSH, T4TOTAL, FREET4, T3FREE, THYROIDAB in the last 72 hours. Anemia Panel: No results for  input(s): VITAMINB12, FOLATE, FERRITIN, TIBC, IRON,  RETICCTPCT in the last 72 hours. Urine analysis:    Component Value Date/Time   COLORURINE YELLOW 09/16/2021 1603   APPEARANCEUR HAZY (A) 09/16/2021 1603   LABSPEC 1.022 09/16/2021 1603   PHURINE 6.5 09/16/2021 1603   GLUCOSEU NEGATIVE 09/16/2021 1603   HGBUR NEGATIVE 09/16/2021 1603   BILIRUBINUR NEGATIVE 09/16/2021 1603   KETONESUR NEGATIVE 09/16/2021 1603   PROTEINUR TRACE (A) 09/16/2021 1603   UROBILINOGEN 0.2 09/07/2011 1240   NITRITE NEGATIVE 09/16/2021 1603   LEUKOCYTESUR MODERATE (A) 09/16/2021 1603    Radiological Exams on Admission: MR ABDOMEN MRCP W WO CONTAST  Result Date: 09/17/2021 CLINICAL DATA:  71 year old female with clinically suspected biliary tract obstruction. EXAM: MRI ABDOMEN WITHOUT AND WITH CONTRAST (INCLUDING MRCP) TECHNIQUE: Multiplanar multisequence MR imaging of the abdomen was performed both before and after the administration of intravenous contrast. Heavily T2-weighted images of the biliary and pancreatic ducts were obtained, and three-dimensional MRCP images were rendered by post processing. CONTRAST:  58mL GADAVIST GADOBUTROL 1 MMOL/ML IV SOLN COMPARISON:  No priors. FINDINGS: Lower chest: Unremarkable. Hepatobiliary: Diffuse loss of signal intensity on out of phase dual echo images, indicative of severe hepatic steatosis. Some areas of focal fatty sparing are noted, most evident adjacent to the gallbladder fossa. No suspicious cystic or solid hepatic lesions. No intrahepatic biliary ductal dilatation. Common bile duct is mildly dilated measuring up to 9 mm in the porta hepatis. However, there are no filling defects within the common bile duct to suggest the presence of choledocholithiasis. Small filling defects lying dependently in the gallbladder compatible with tiny gallstones. Gallbladder is not distended. Gallbladder wall thickness is normal. No gallbladder wall thickening or pericholecystic fluid or inflammatory changes. Pancreas: No solid pancreatic mass.  No pancreatic ductal dilatation noted on MRCP images. In the posterior aspect of the pancreatic head there are 2 adjacent T1 hypointense, T2 hyperintense, nonenhancing lesions, without definite communication with the adjacent pancreatic duct, largest of which measures 1.4 cm (coronal image 25 of series 5). No other peripancreatic fluid collections or inflammatory changes. Spleen:  Unremarkable. Adrenals/Urinary Tract: Left kidney is not visualized, either surgically or congenitally absent. Right kidney and bilateral adrenal glands are normal in appearance. No right hydroureteronephrosis. Stomach/Bowel: Visualized portions are unremarkable. Vascular/Lymphatic: No aneurysm identified in the visualized abdominal vasculature. No lymphadenopathy noted in the abdomen. Other: No significant volume of ascites noted in the visualized portions of the peritoneal cavity. Musculoskeletal: No aggressive appearing osseous lesions are noted in the visualized portions of the skeleton. IMPRESSION: 1. Study is positive for cholelithiasis, but there is no evidence of acute cholecystitis at this time. 2. Very mild common bile duct dilatation measuring up to 9 mm in the porta hepatis. No intrahepatic biliary ductal dilatation to suggest true biliary tract obstruction at this time. Additionally, there is no evidence of obstructing pancreatic head mass or choledocholithiasis. 3. Severe hepatic steatosis. 4. Small cystic lesions in the posterior aspect of the head of the pancreas, which have a benign appearance, favored to represent tiny pancreatic pseudocysts or side branch IPMN (intraductal papillary mucinous neoplasm). Recommend follow up pre and post contrast MRI/MRCP or pancreatic protocol CT in 2 years. This recommendation follows ACR consensus guidelines: Management of Incidental Pancreatic Cysts: A White Paper of the ACR Incidental Findings Committee. [place] [id number]. Electronically Signed   By: Vinnie Langton  M.D.   On: 09/17/2021 05:20   US Abdomen Limited RUQ (LIVER/GB)  Result Date: 09/16/2021 CLINICAL DATA:  Right upper quadrant pain for 3 days. EXAM: ULTRASOUND ABDOMEN LIMITED RIGHT UPPER QUADRANT COMPARISON:  CT abdomen and pelvis 05/27/2016 FINDINGS: The examination was limited by patient body habitus and overlying bowel gas. Gallbladder: Multiple tiny gallstones. No gallbladder wall thickening. No sonographic Murphy sign noted by sonographer. Common bile duct: Diameter: 12 mm Liver: Diffusely increased parenchymal echogenicity without a focal lesion identified. Portal vein is patent on color Doppler imaging with normal direction of blood flow towards the liver. Other: None. IMPRESSION: 1. Cholelithiasis without evidence of acute cholecystitis. 2. Dilated common bile duct. 3. Echogenic liver, nonspecific but compatible with steatosis. Electronically Signed   By: Logan Bores M.D.   On: 09/16/2021 17:37     EKG: Independently reviewed, with result as described above.    Assessment/Plan   Principal Problem:   Epigastric pain Active Problems:   DM2 (diabetes mellitus, type 2) (HCC)   Elevated troponin   Hypercalcemia   Hypertension     #) Epigastric pain: 2 days of sharp, intermittent, epigastric discomfort, with an element of postprandial exacerbation, with further quadrant ultrasound showing cholelithiasis without evidence of acute cholecystitis, also demonstrating dilated common bile duct, without radiopaque choledocholithiasis.  This associate with no evidence of cholestatic pattern per review of associated liver enzymes.  Mildly elevated lipase of 86.  No evidence of acute surgical abdomen per physical exam this evening. EDP discussed the patient's case with the on-call [facility] gastroenterologist, Dr. Michail Sermon, Who recommended pursuit of MRCP, with plan for [facility] to formally consult.  Consequently, MRCP has been ordered, with result currently pending.  Differential also includes recently  passed gallstone in the setting of observed cholelithiasis on right upper quadrant ultrasound.  Differential also includes GERD in the setting of documented history there of.  Additionally, in the context of mildly elevated troponin, will continue to trend serial troponin level and pursue echocardiogram in the morning as means of ACS evaluation.  Plan: Protonix 40 mg IV x1.  MRCP.  [facility] consulted, as above.  Repeat CMP in the morning.  Add on serum magnesium level.  Trend troponin.     #) Elevated troponin: Mildly elevated troponin, as quantified above.  Unclear significance at this time.  ACS appears less likely at this time in the absence of any typical chest pain, while EKG shows no evidence of acute ischemic changes.  However, in the setting of epigastric pain, will further evaluate for ACS, via trending of serial troponin and pursuit of echocardiogram.  Plan: Repeat troponin in the morning.  Echocardiogram ordered.  Monitor oximetry.  Add on some insulin.       #) Hypercalcemia: Mildly elevated serum calcium level of 10.5, with suspected element of dehydration in the context of recent postprandial epigastric discomfort with associated nausea resulting in relative decline in oral intake over the last few days.  Plan: Gentle IV fluids overnight.  Repeat serum calcium level in the morning, with plan to consider expansion of evaluation thereof if ensuing calcium level does not trend down in response to interval IV fluid intervention.       #) Type 2 diabetes mellitus: Documented history of such, on Toujeo 44 units subcu twice daily as well as sliding scale NovoLog as an outpatient.  Presenting blood sugar noted to be 147.   Plan: Basal insulin in the form of Lantus 10 units subcu twice daily.  In the context of current n.p.o. status pending result of MRCP, I have ordered Accu-Cheks every 6 hours with low-dose high  scale insulin.     #) Essential hypertension goal documented as  result, on labetalol as well as Norvasc in addition to losartan and chlorthalidone as an outpatient.  Presenting systolic blood pressures in the range of 150s to 170s.  Plan: Will resume component of home and hypertensive regimen for now in the form of resumption of Norvasc as well as labetalol.  Hold home chlorthalidone losartan for now.  Close monitoring of ensuing blood pressure via routine vital signs.       #) GERD: Documented as result, on Protonix as outpatient.  In the context of presenting epigastric pain, will transition to IV Protonix for now.  Plan: Protonix 40 mg IV daily for now, as above.  Further evaluation management presenting epigastric discomfort, as above.      DVT prophylaxis: SCD's   Code Status: Full code Family Communication: none Disposition Plan: Per Rounding Team Consults called: on-call [facility] (Dr. Michail Sermon) consulted, as further detailed above;  Admission status: Observation; med telemetry   PLEASE NOTE THAT DRAGON DICTATION SOFTWARE WAS USED IN THE CONSTRUCTION OF THIS NOTE.   Livingston Wheeler DO [facility]  From [place]   09/17/2021, 6:39 AM

## 2021-09-17 NOTE — Consult Note (Signed)
[facility] Consult  Referring Provider: [facility] hospital[facility] Primary Care Physician:  Shon Baton, MD Primary Gastroenterologist: Dr. Watt Climes  Reason for Consultation: Epigastric pain  HPI: Debra Jordan is a 71 y.o. female was in her usual state of health until Tuesday days ago when she developed severe epigastric pain, called her primary care physician and was advised to proceed to the ER. Epigastric pain was associated with nausea without vomiting or fever and has resolved since 5 PM yesterday.  Patient has acid reflux for which she takes pantoprazole at home, denies worsening reflux, difficulty swallowing, pain on swallowing. She denies change in appetite, has been intentionally trying to lose weight. Denies change in bowel habits, denies noticing blood in stool or black stools. Last colonoscopy was in 2005 by Dr. Timmothy Euler, left-sided diverticulosis noted, repeat recommended in 10 years. However, she does not want any further colonoscopies because a family member/friend had a perforation following colonoscopy. She has not had Cologuard testing done recently.  Patient denies alcohol use. She lives at home with her daughter, who is handicapped and requires assistance.   Past Medical History:  Diagnosis Date   ADD (attention deficit disorder)    Arthritis    gout   Blue toe syndrome ([place]) 10/06/2011   Broken shoulder    And tailbone    Cataract    Mixed form OU   Chronic kidney disease    hx of left nephrectomy   Diabetes mellitus    type 2   Diabetic retinopathy ([place])    NPDR OU   DM w/o complication type II, uncontrolled 09/06/2011   Fatty liver    Fibromyalgia    Gout 09/06/2011   History of headache    History of kidney stones    History of nephrectomy, unilateral 09/06/2011   History of PCOS    History of venous thrombosis and embolism    Left great toe   Hyperlipidemia    Hypersomnolence    Hypertension    Hypertensive retinopathy    OU   Hypothyroid  09/06/2011   Hypothyroidism    Ischemia of extremity 09/06/2011   Migraine headache 09/06/2011   Neuromuscular disorder (HCC)    neuropathy to feet bilateral   Obesity    Seizure disorder ([place])    Trouble swallowing     Past Surgical History:  Procedure Laterality Date   ABDOMINAL HYSTERECTOMY     HERNIA REPAIR     LYMPH NODE BIOPSY     NASAL SINUS SURGERY     NEPHRECTOMY Left 2002   left nephrectomy   [place]      Prior to Admission medications   Medication Sig Start Date End Date Taking? Authorizing Provider  albuterol (VENTOLIN HFA) 108 (90 Base) MCG/ACT inhaler 1-2 puffs every 4 (four) hours as needed for wheezing or shortness of breath. 07/10/20   [provider]  amLODipine (NORVASC) 5 MG tablet Take 5 mg by mouth at bedtime.  01/22/20   [provider]  Armodafinil 150 MG tablet Take 150 mg by mouth as directed. 1/2 tablet prn    [provider]  atorvastatin (LIPITOR) 10 MG tablet Take 10 mg by mouth daily. 09/02/21   [provider]  chlorthalidone (HYGROTON) 25 MG tablet Take 12.5 mg by mouth every morning. 12/25/20   [provider]  Cholecalciferol (VITAMIN D3 PO) Take by mouth daily.    [provider]  cyclobenzaprine (FLEXERIL) 10 MG tablet Take 10 mg by  mouth at bedtime.    [provider]  DULoxetine (CYMBALTA) 30 MG capsule Take 90 mg by mouth daily. 08/06/21   [provider]  DULoxetine (CYMBALTA) 60 MG capsule Take 1 capsule by mouth daily.    [provider]  ezetimibe (ZETIA) 10 MG tablet Take 1 tablet (10 mg total) by mouth daily after supper. 07/20/20 10/18/20  Adrian Prows, MD  fenofibrate 160 MG tablet Take 1 tablet (160 mg total) by mouth daily. 09/08/11 04/27/21  Shon Baton, MD  furosemide (LASIX) 20 MG tablet Take 1 tablet (20 mg total) by mouth daily as needed. 06/04/20   Adrian Prows, MD  HUMALOG KWIKPEN 200 UNIT/ML KwikPen Inject 0-100 Units into the  skin See admin instructions. 08/24/21   [provider]  insulin lispro (HUMALOG) 100 UNIT/ML injection Inject 3-15 Units into the skin 4 (four) times daily. Sliding scale    [provider]  Insulin Pen Needle (BD PEN NEEDLE NANO U/F) 32G X 4 MM MISC Use 1 pen needle as directed with Rx of Humalog and Lantus Dx code-E11.51 06/08/10   [provider]  labetalol (NORMODYNE) 100 MG tablet Take 1 tablet (100 mg total) by mouth 2 (two) times daily. 07/20/20   Adrian Prows, MD  levothyroxine (SYNTHROID) 112 MCG tablet Take 1 tablet by mouth daily.    [provider]  losartan (COZAAR) 50 MG tablet Take 50 mg by mouth 2 (two) times daily.  08/04/14   [provider]  montelukast (SINGULAIR) 10 MG tablet Take 10 mg by mouth at bedtime. 07/10/20   [provider]  Multiple Vitamin (MULTIVITAMIN) tablet Take 1 tablet by mouth daily.    [provider]  Multiple Vitamins-Minerals (CENTRUM SILVER 50+WOMEN) TABS See admin instructions.    [provider]  NOVOLIN R FLEXPEN RELION 100 UNIT/ML FlexPen Inject into the skin. 09/02/21   [provider]  ondansetron (ZOFRAN) 4 MG tablet Take 4 mg by mouth 3 (three) times daily as needed. 01/30/20   [provider]  pantoprazole (PROTONIX) 40 MG tablet Take 40 mg by mouth daily.    [provider]  Pitavastatin Calcium 2 MG TABS Take 1 tablet (2 mg total) by mouth daily. Patient taking differently: Take 1 mg by mouth daily. 07/20/20   Adrian Prows, MD  rivaroxaban (XARELTO) 2.5 MG TABS tablet Take 2.5 mg by mouth 2 (two) times daily. XARLETO 09/08/11   Collins, Emma M, PA-C  TOUJEO MAX SOLOSTAR 300 UNIT/ML Solostar Pen Inject 44 Units into the skin in the morning and at bedtime. 07/13/20   [provider]    Current Facility-Administered Medications  Medication Dose Route Frequency Provider Last Rate Last Admin   acetaminophen (TYLENOL) tablet 650 mg  650 mg Oral Q6H  PRN Howerter, Justin B, DO       Or   acetaminophen (TYLENOL) suppository 650 mg  650 mg Rectal Q6H PRN Howerter, Justin B, DO       amLODipine (NORVASC) tablet 5 mg  5 mg Oral QHS Howerter, Justin B, DO       fentaNYL (SUBLIMAZE) injection 50 mcg  50 mcg Intravenous Q2H PRN Howerter, Justin B, DO       insulin aspart (novoLOG) injection 0-9 Units  0-9 Units Subcutaneous Q6H Howerter, Justin B, DO       insulin glargine-yfgn (SEMGLEE) injection 10 Units  10 Units Subcutaneous BID Howerter, Justin B, DO       labetalol (NORMODYNE) tablet 100 mg  100 mg Oral BID Howerter, Justin B, DO       lactated ringers infusion   Intravenous Continuous Howerter, Justin B, DO 50 mL/hr at 09/17/21 0708 New Bag at 09/17/21 0708   naloxone [facility]) injection 0.4 mg  0.4 mg Intravenous PRN Howerter, Justin B, DO       ondansetron (ZOFRAN) injection 4 mg  4 mg Intravenous Q6H PRN Howerter, Justin B, DO       pantoprazole (PROTONIX) injection 40 mg  40 mg Intravenous Daily Howerter, Justin B, DO       Current Outpatient Medications  Medication Sig Dispense Refill   albuterol (VENTOLIN HFA) 108 (90 Base) MCG/ACT inhaler 1-2 puffs every 4 (four) hours as needed for wheezing or shortness of breath.     amLODipine (NORVASC) 5 MG tablet Take 5 mg by mouth at bedtime.      Armodafinil 150 MG tablet Take 150 mg by mouth as directed. 1/2 tablet prn     atorvastatin (LIPITOR) 10 MG tablet Take 10 mg by mouth daily.     chlorthalidone (HYGROTON) 25 MG tablet Take 12.5 mg by mouth every morning.     Cholecalciferol (VITAMIN D3 PO) Take by mouth daily.     cyclobenzaprine (FLEXERIL) 10 MG tablet Take 10 mg by mouth at bedtime.     DULoxetine (CYMBALTA) 30 MG capsule Take 90 mg by mouth daily.     DULoxetine (CYMBALTA) 60 MG capsule Take 1 capsule by mouth daily.     ezetimibe (ZETIA) 10 MG tablet Take 1 tablet (10 mg total) by mouth daily after supper. 30 tablet 2   fenofibrate 160 MG tablet Take 1 tablet (160 mg total) by  mouth daily.     furosemide (LASIX) 20 MG tablet Take 1 tablet (20 mg total) by mouth daily as needed. 30 tablet    HUMALOG KWIKPEN 200 UNIT/ML KwikPen Inject 0-100 Units into the skin See admin instructions.     insulin lispro (HUMALOG) 100 UNIT/ML injection Inject 3-15 Units into the skin 4 (four) times daily. Sliding scale     Insulin Pen Needle (BD PEN NEEDLE NANO U/F) 32G X 4 MM MISC Use 1 pen needle as directed with Rx of Humalog and Lantus Dx code-E11.51     labetalol (NORMODYNE) 100 MG tablet Take 1 tablet (100 mg total) by mouth 2 (two) times daily. 90 tablet 3   levothyroxine (SYNTHROID) 112 MCG tablet Take 1 tablet by mouth daily.     losartan (COZAAR) 50 MG tablet Take 50 mg by mouth 2 (two) times daily.      montelukast (SINGULAIR) 10 MG tablet Take 10 mg by mouth at bedtime.     Multiple Vitamin (MULTIVITAMIN) tablet Take 1 tablet by mouth daily.     Multiple Vitamins-Minerals (CENTRUM SILVER 50+WOMEN) TABS See admin instructions.     NOVOLIN R FLEXPEN RELION 100 UNIT/ML FlexPen Inject into the skin.     ondansetron (ZOFRAN) 4 MG tablet Take 4 mg by mouth 3 (three) times daily as needed.     pantoprazole (PROTONIX) 40 MG tablet Take 40 mg by mouth daily.     Pitavastatin Calcium 2 MG TABS Take 1 tablet (2 mg total) by mouth daily. (Patient taking differently: Take 1 mg by mouth daily.) 30 tablet 2   rivaroxaban (XARELTO) 2.5 MG TABS tablet Take 2.5 mg by mouth 2 (two) times daily. XARLETO     TOUJEO MAX SOLOSTAR 300 UNIT/ML Solostar Pen Inject 44 Units into the skin in  the morning and at bedtime.      Allergies as of 09/16/2021 - Review Complete 09/16/2021  Allergen Reaction Noted   Statins Other (See Comments) 09/05/2011   Norvasc [amlodipine]  08/09/2019   Penicillins  09/05/2011   Sulfa antibiotics Nausea Only 09/05/2011   Latex Rash 09/05/2011    Family History  Problem Relation Age of Onset   Other Mother        blood clot history, varicose veins   Stroke Mother     Diabetes Mother    Cancer Father    Stroke Maternal Grandmother    Healthy Brother    Healthy Brother    Healthy Brother    Diabetes Daughter     Social History   Socioeconomic History   Marital status: Widowed    Spouse name: Not on file   Number of children: 2   Years of education: college   Highest education level: Not on file  Occupational History    Comment: [employer]  Tobacco Use   Smoking status: Never   Smokeless tobacco: Never  Vaping Use   Vaping Use: Never used  Substance and Sexual Activity   Alcohol use: No   Drug use: No   Sexual activity: Not Currently    Birth control/protection: Post-menopausal  Other Topics Concern   Not on file  Social History Narrative   Caffeine- 4-6 cups per day    Lives at home with her adult daughter ( daughter has health problems)    Social Determinants of Health   Financial Resource Strain: Not on file  Food Insecurity: Not on file  Transportation Needs: Not on file  Physical Activity: Not on file  Stress: Not on file  Social Connections: Not on file  Intimate Partner Violence: Not on file    Review of Systems: Positive for: GI: Described in detail in HPI.    Gen: Denies any fever, chills, rigors, night sweats, anorexia, fatigue, weakness, malaise, involuntary weight loss, and sleep disorder CV: Denies chest pain, angina, palpitations, syncope, orthopnea, PND, peripheral edema, and claudication. Resp: Denies dyspnea, cough, sputum, wheezing, coughing up blood. GU : Denies urinary burning, blood in urine, urinary frequency, urinary hesitancy, nocturnal urination, and urinary incontinence. MS: Denies joint pain or swelling.  Denies muscle weakness, cramps, atrophy.  Derm: Denies rash, itching, oral ulcerations, hives, unhealing ulcers.  Psych: Denies depression, anxiety, memory loss, suicidal ideation, hallucinations,  and confusion. Heme: Denies bruising, bleeding, and enlarged lymph nodes. Neuro:  Denies  any headaches, dizziness, paresthesias. Endo:  DM, hypothyroid, denies any problems with  adrenal function.  Physical Exam: Vital signs in last 24 hours: Temp:  [97.9 F (36.6 C)-98.1 F (36.7 C)] 98.1 F (36.7 C) (01/26 1941) Pulse Rate:  [70-85] 82 (01/27 0800) Resp:  [15-20] 18 (01/27 0800) BP: (135-188)/(72-128) 166/90 (01/27 0800) SpO2:  [92 %-100 %] 96 % (01/27 0800) Weight:  [104.9 kg] 104.9 kg (01/26 1440)    General:   Alert,  Well-developed, overweight, pleasant and cooperative in NAD Head:  Normocephalic and atraumatic. Eyes:  Sclera clear, no icterus.   Conjunctiva pink. Ears:  Normal auditory acuity. Nose:  No deformity, discharge,  or lesions. Mouth:  No deformity or lesions.  Oropharynx pink & moist. Neck:  Supple; no masses or thyromegaly. Lungs:  Clear throughout to auscultation.   No wheezes, crackles, or rhonchi. No acute distress. Heart:  Regular rate and rhythm; no murmurs, clicks, rubs,  or gallops. Extremities:  Without clubbing or edema. Neurologic:  Alert and  oriented x4;  grossly normal neurologically. Skin:  Intact without significant lesions or rashes. Psych:  Alert and cooperative. Normal mood and affect. Abdomen:  Soft, mild epigastric tenderness and nondistended. No masses, hepatosplenomegaly or hernias noted. Normal bowel sounds, without guarding, and without rebound.         Lab Results: Recent Labs    09/16/21 1449 09/17/21 0500  WBC 10.6* 7.3  HGB 13.4 13.0  HCT 42.8 41.1  PLT 260 246   BMET Recent Labs    09/16/21 1449 09/17/21 0500  NA 142 140  K 3.9 3.9  CL 104 110  CO2 29 23  GLUCOSE 147* 124*  BUN 20 16  CREATININE 1.58* 1.63*  CALCIUM 10.5* 9.5   LFT Recent Labs    09/17/21 0500  PROT 6.7  ALBUMIN 3.7  AST 32  ALT 24  ALKPHOS 51  BILITOT 0.5   PT/INR No results for input(s): LABPROT, INR in the last 72 hours.  Studies/Results: MR ABDOMEN MRCP W WO CONTAST  Result Date: 09/17/2021 CLINICAL DATA:   71 year old female with clinically suspected biliary tract obstruction. EXAM: MRI ABDOMEN WITHOUT AND WITH CONTRAST (INCLUDING MRCP) TECHNIQUE: Multiplanar multisequence MR imaging of the abdomen was performed both before and after the administration of intravenous contrast. Heavily T2-weighted images of the biliary and pancreatic ducts were obtained, and three-dimensional MRCP images were rendered by post processing. CONTRAST:  65mL GADAVIST GADOBUTROL 1 MMOL/ML IV SOLN COMPARISON:  No priors. FINDINGS: Lower chest: Unremarkable. Hepatobiliary: Diffuse loss of signal intensity on out of phase dual echo images, indicative of severe hepatic steatosis. Some areas of focal fatty sparing are noted, most evident adjacent to the gallbladder fossa. No suspicious cystic or solid hepatic lesions. No intrahepatic biliary ductal dilatation. Common bile duct is mildly dilated measuring up to 9 mm in the porta hepatis. However, there are no filling defects within the common bile duct to suggest the presence of choledocholithiasis. Small filling defects lying dependently in the gallbladder compatible with tiny gallstones. Gallbladder is not distended. Gallbladder wall thickness is normal. No gallbladder wall thickening or pericholecystic fluid or inflammatory changes. Pancreas: No solid pancreatic mass. No pancreatic ductal dilatation noted on MRCP images. In the posterior aspect of the pancreatic head there are 2 adjacent T1 hypointense, T2 hyperintense, nonenhancing lesions, without definite communication with the adjacent pancreatic duct, largest of which measures 1.4 cm (coronal image 25 of series 5). No other peripancreatic fluid collections or inflammatory changes. Spleen:  Unremarkable. Adrenals/Urinary Tract: Left kidney is not visualized, either surgically or congenitally absent. Right kidney and bilateral adrenal glands are normal in appearance. No right hydroureteronephrosis. Stomach/Bowel: Visualized portions are  unremarkable. Vascular/Lymphatic: No aneurysm identified in the visualized abdominal vasculature. No lymphadenopathy noted in the abdomen. Other: No significant volume of ascites noted in the visualized portions of the peritoneal cavity. Musculoskeletal: No aggressive appearing osseous lesions are noted in the visualized portions of the skeleton. IMPRESSION: 1. Study is positive for cholelithiasis, but there is no evidence of acute cholecystitis at this time. 2. Very mild common bile duct dilatation measuring up to 9 mm in the porta hepatis. No intrahepatic biliary ductal dilatation to suggest true biliary tract obstruction at this time. Additionally, there is no evidence of obstructing pancreatic head mass or choledocholithiasis. 3. Severe hepatic steatosis. 4. Small cystic lesions in the posterior aspect of the head of the pancreas, which have a benign appearance, favored to represent tiny pancreatic pseudocysts or side branch IPMN (intraductal papillary mucinous  neoplasm). Recommend follow up pre and post contrast MRI/MRCP or pancreatic protocol CT in 2 years. This recommendation follows ACR consensus guidelines: Management of Incidental Pancreatic Cysts: A White Paper of the ACR Incidental Findings Committee. [place] [id number]. Electronically Signed   By: Vinnie Langton M.D.   On: 09/17/2021 05:20   US Abdomen Limited RUQ (LIVER/GB)  Result Date: 09/16/2021 CLINICAL DATA:  Right upper quadrant pain for 3 days. EXAM: ULTRASOUND ABDOMEN LIMITED RIGHT UPPER QUADRANT COMPARISON:  CT abdomen and pelvis 05/27/2016 FINDINGS: The examination was limited by patient body habitus and overlying bowel gas. Gallbladder: Multiple tiny gallstones. No gallbladder wall thickening. No sonographic Murphy sign noted by sonographer. Common bile duct: Diameter: 12 mm Liver: Diffusely increased parenchymal echogenicity without a focal lesion identified. Portal vein is patent on color Doppler imaging with normal  direction of blood flow towards the liver. Other: None. IMPRESSION: 1. Cholelithiasis without evidence of acute cholecystitis. 2. Dilated common bile duct. 3. Echogenic liver, nonspecific but compatible with steatosis. Electronically Signed   By: Logan Bores M.D.   On: 09/16/2021 17:37    Impression: Epigastric pain, normal LFTs, T bili/AST/ALT/ALP of 0.5/32/24/51 likely due to Biliary colic  Minimally elevated lipase of 84 (less than 2 times upper normal limit)  Ultrasound: Tiny multiple gallstones, CBD 12 mm, echogenic liver  MRCP: Cholelithiasis, very mild CBD dilation of 9 mm at porta hepatis, no intrahepatic biliary dilatation, no choledocholithiasis, severe hepatic steatosis, small cystic lesions in head of pancreas possible tiny pancreatic pseudocyst or sidebranch IPMN, repeat MRI with pancreatic protocol recommended in 2 years  Plan: No indication for ERCP Patient will benefit from cholecystectomy-inpatient versus outpatient, there is currently no evidence of acute cholecystitis Advised patient to get a Cologuard test for colon cancer screening, last colonoscopy 2005 and patient not willing to undergo further colonoscopies, this can be arranged as an outpatient. GI will sign off, please recall if needed.   LOS: 0 days   Ronnette Juniper, MD  09/17/2021, 9:30 AM

## 2021-09-17 NOTE — Discharge Planning (Signed)
RNCM consulted regarding transportation needs for pt.  RNCM provided [employer] as pt has no transportation from hospital.  RN presented and explained [employer] and Release of Liability Form.  Pt signed waiver, therefore agreeing to written terms.  RNCM went to lobby, where pt was awaiting ride to tell her that ride had arrived.

## 2021-09-17 NOTE — ED Notes (Signed)
Pt place on 4 L to help with dropping O2 saturation when asleep. Pt desatted to 80% and when woken up O2 saturation went up to 99%

## 2021-10-04 ENCOUNTER — Ambulatory Visit: Payer: Self-pay | Admitting: Surgery

## 2021-10-04 DIAGNOSIS — K8020 Calculus of gallbladder without cholecystitis without obstruction: Secondary | ICD-10-CM

## 2021-10-04 DIAGNOSIS — K862 Cyst of pancreas: Secondary | ICD-10-CM

## 2021-10-04 HISTORY — DX: Cyst of pancreas: K86.2

## 2021-10-04 NOTE — H&P (View-Only) (Signed)
History of Present Illness: Debra Jordan is a 71 y.o. female who was referred to me for evaluation of gallstones.  A few weeks ago she developed acute epigastric abdominal pain.  It lasted for several days and was associated with chills.  She was seen by her PCP, who sent her to the ED for further work-up on 1/26.  Her labs were overall unremarkable, and WBC and LFTs were normal.  Lipase was only very mildly elevated at 84.  She had a RUQ [place] which showed cholelithiasis with a very mildly dilated CBD but no signs of acute cholecystitis.  She then had an MRCP which showed that her CBD was 9 mm in diameter but there were no filling defects or mass lesions.  She incidentally also had several small cysts in the head of the pancreas.  She was thought to have passed a stone via the common bile duct and her pain subsequently resolved.  Her LFTs remain normal with no clinical jaundice.  She was discharged home 1/27 with outpatient follow-up.  She has not had any further pain since that episode, and says that she had never had any similar pain prior to that.  She denies any postprandial pain.   Prior abdominal surgeries include hysterectomy and a left nephrectomy (2002).  He has also had an incisional hernia repair at the site of her nephrectomy incision.  She has a prior history of DVT over 10 years ago, and says she is supposed to be on Xarelto but is not currently taking it because she gets nosebleeds.     Review of Systems: A complete review of systems was obtained from the patient.  I have reviewed this information and discussed as appropriate with the patient.  See HPI as well for other ROS.       Medical History: Past Medical History Past Medical History: Diagnosis Date  Anxiety    Arthritis    Chronic kidney disease    GERD (gastroesophageal reflux disease)    History of stroke    Hyperlipidemia    Hypertension    Thyroid disease        Patient Active Problem List Diagnosis  Gallstones  without obstruction of gallbladder  Pancreatic cyst     Past Surgical History Past Surgical History: Procedure Laterality Date  HYSTERECTOMY          Allergies Allergies Allergen Reactions  Penicillins Other (See Comments), Unknown and Swelling  Latex, Natural Rubber Rash     Rash and swelling        Current Outpatient Medications on File Prior to Visit Medication Sig Dispense Refill  albuterol 90 mcg/actuation inhaler albuterol sulfate HFA 90 mcg/actuation aerosol inhaler  INHALE 1 TO 2 PUFFS BY MOUTH EVERY 4 HOURS AS NEEDED      alclometasone (ACLOVATE) 0.05 % ointment alclometasone 0.05 % topical ointment  APPLY A THIN LAYER TOPICALLY TO THE AFFECTED AREA TWICE DAILY FOR A MAX OF 2 WEEKS      amLODIPine (NORVASC) 5 MG tablet amlodipine 5 mg tablet  TAKE 1 TABLET BY MOUTH AT BEDTIME      atorvastatin (LIPITOR) 10 MG tablet Take 1 tablet by mouth once daily      cyclobenzaprine (FLEXERIL) 10 MG tablet Take 1 tablet po q 12 hours prn      fenofibrate 160 MG tablet fenofibrate 160 mg tablet  TAKE 1 TABLET BY MOUTH EVERY DAY      insulin lispro (HUMALOG KWIKPEN INSULIN) 200 unit/mL (3 mL) InPn  Humalog KwikPen U-200 Insulin 200 unit/mL (3 mL) subcutaneous  ADMINISTER UP TO 100 UNITS UNDER THE SKIN DAILY WITH MEALS AS DIRECTED      losartan (COZAAR) 50 MG tablet losartan 50 mg tablet  TAKE 1 TABLET BY MOUTH TWICE DAILY      pantoprazole (PROTONIX) 40 MG DR tablet pantoprazole 40 mg tablet,delayed release  TAKE 1 TABLET BY MOUTH EVERY DAY FOR STOMACH ACID OR REFLUX       No current facility-administered medications on file prior to visit.     Family History Family History Problem Relation Age of Onset  Coronary Artery Disease (Blocked arteries around heart) Mother    Obesity Mother    High blood pressure (Hypertension) Mother    Hyperlipidemia (Elevated cholesterol) Mother    Diabetes Mother    Deep vein thrombosis (DVT or abnormal blood clot formation) Mother    High  blood pressure (Hypertension) Brother    Skin cancer Brother    Hyperlipidemia (Elevated cholesterol) Brother        Social History   Tobacco Use Smoking Status Never Smokeless Tobacco Never     Social History Social History    Socioeconomic History  Marital status: Widowed Tobacco Use  Smoking status: Never  Smokeless tobacco: Never Substance and Sexual Activity  Alcohol use: Never  Drug use: Never      Objective:     Vitals:   10/04/21 0856 BP: 136/80 Pulse: 98 Temp: 36.8 C (98.3 F) SpO2: 98% Weight: (!) 105.5 kg (232 lb 9.6 oz) Height: 162.6 cm (5\' 4" )   Body mass index is 39.93 kg/m.   Physical Exam Vitals reviewed.  Constitutional:      General: She is not in acute distress.    Appearance: Normal appearance.  HENT:     Head: Normocephalic and atraumatic.  Eyes:     General: No scleral icterus.    Conjunctiva/sclera: Conjunctivae normal.  Cardiovascular:     Rate and Rhythm: Normal rate and regular rhythm.     Heart sounds: No murmur heard. Pulmonary:     Effort: Pulmonary effort is normal. No respiratory distress.     Breath sounds: Normal breath sounds. No wheezing.  Abdominal:     General: There is no distension.     Palpations: Abdomen is soft.     Tenderness: There is no abdominal tenderness.     Comments: Well-healed surgical scars, scars in the upper abdomen or right upper quadrant.  Musculoskeletal:        General: No swelling or deformity. Normal range of motion.     Cervical back: Normal range of motion.  Skin:    General: Skin is warm and dry.     Coloration: Skin is not jaundiced.  Neurological:     General: No focal deficit present.     Mental Status: She is alert and oriented to person, place, and time.  Psychiatric:        Mood and Affect: Mood normal.        Behavior: Behavior normal.        Thought Content: Thought content normal.          Labs, Imaging and Diagnostic Testing: MRCP 09/16/21: IMPRESSION: 1. Study  is positive for cholelithiasis, but there is no evidence of acute cholecystitis at this time. 2. Very mild common bile duct dilatation measuring up to 9 mm in the porta hepatis. No intrahepatic biliary ductal dilatation to suggest true biliary tract obstruction at this time. Additionally, there is  no evidence of obstructing pancreatic head mass or choledocholithiasis. 3. Severe hepatic steatosis. 4. Small cystic lesions in the posterior aspect of the head of the pancreas, which have a benign appearance, favored to represent tiny pancreatic pseudocysts or side branch IPMN (intraductal papillary mucinous neoplasm).    Assessment and Plan: Diagnoses and all orders for this visit:   Calculus of gallbladder without cholecystitis without obstruction   Cystic mass of pancreas -     MRI abdomen and MRCP w wo contrast w 3D; Future       This is a 71 yo female referred for evaluation for cholecystectomy following an episode of epigastric abdominal pain with mild CBD dilation.  Based on her symptoms and presence of cholelithiasis, I agree it is likely she passed a stone via the common bile duct. I reviewed her [place] and her MRCP, both of which confirm cholelithiasis. Her MRI also shows multiple small cysts in the head of the pancreas, but no mural nodules or main PD dilation or change in caliber. I recommended laparoscopic cholecystectomy to prevent further episodes of choledocholithiasis and associated complications such as cholangitis and acute pancreatitis. The details of this procedure were discussed with the patient, including the risks of bleeding, infection, bile leak, and <0.5% risk of common bile duct injury. The patient expressed understanding and agrees to proceed with surgery. She will be contacted to schedule an elective surgery date.   Her pancreatic cysts likely represent side-branch IPMNs. In the absence of high risk features, no further workup is needed right now but these will need to be  followed. We will repeat an MRCP in 1 year for surveillance and I will see her back in clinic at that time.  Michaelle Birks, MD [facility] Surgery General, Hepatobiliary and Pancreatic Surgery 10/04/21 9:32 AM

## 2021-10-04 NOTE — H&P (Signed)
History of Present Illness: Debra Jordan is a 71 y.o. female who was referred to me for evaluation of gallstones.  A few weeks ago she developed acute epigastric abdominal pain.  It lasted for several days and was associated with chills.  She was seen by her PCP, who sent her to the ED for further work-up on 1/26.  Her labs were overall unremarkable, and WBC and LFTs were normal.  Lipase was only very mildly elevated at 84.  She had a RUQ [place] which showed cholelithiasis with a very mildly dilated CBD but no signs of acute cholecystitis.  She then had an MRCP which showed that her CBD was 9 mm in diameter but there were no filling defects or mass lesions.  She incidentally also had several small cysts in the head of the pancreas.  She was thought to have passed a stone via the common bile duct and her pain subsequently resolved.  Her LFTs remain normal with no clinical jaundice.  She was discharged home 1/27 with outpatient follow-up.  She has not had any further pain since that episode, and says that she had never had any similar pain prior to that.  She denies any postprandial pain.   Prior abdominal surgeries include hysterectomy and a left nephrectomy (2002).  He has also had an incisional hernia repair at the site of her nephrectomy incision.  She has a prior history of DVT over 10 years ago, and says she is supposed to be on Xarelto but is not currently taking it because she gets nosebleeds.     Review of Systems: A complete review of systems was obtained from the patient.  I have reviewed this information and discussed as appropriate with the patient.  See HPI as well for other ROS.       Medical History: Past Medical History Past Medical History: Diagnosis Date  Anxiety    Arthritis    Chronic kidney disease    GERD (gastroesophageal reflux disease)    History of stroke    Hyperlipidemia    Hypertension    Thyroid disease        Patient Active Problem List Diagnosis  Gallstones  without obstruction of gallbladder  Pancreatic cyst     Past Surgical History Past Surgical History: Procedure Laterality Date  HYSTERECTOMY          Allergies Allergies Allergen Reactions  Penicillins Other (See Comments), Unknown and Swelling  Latex, Natural Rubber Rash     Rash and swelling        Current Outpatient Medications on File Prior to Visit Medication Sig Dispense Refill  albuterol 90 mcg/actuation inhaler albuterol sulfate HFA 90 mcg/actuation aerosol inhaler  INHALE 1 TO 2 PUFFS BY MOUTH EVERY 4 HOURS AS NEEDED      alclometasone (ACLOVATE) 0.05 % ointment alclometasone 0.05 % topical ointment  APPLY A THIN LAYER TOPICALLY TO THE AFFECTED AREA TWICE DAILY FOR A MAX OF 2 WEEKS      amLODIPine (NORVASC) 5 MG tablet amlodipine 5 mg tablet  TAKE 1 TABLET BY MOUTH AT BEDTIME      atorvastatin (LIPITOR) 10 MG tablet Take 1 tablet by mouth once daily      cyclobenzaprine (FLEXERIL) 10 MG tablet Take 1 tablet po q 12 hours prn      fenofibrate 160 MG tablet fenofibrate 160 mg tablet  TAKE 1 TABLET BY MOUTH EVERY DAY      insulin lispro (HUMALOG KWIKPEN INSULIN) 200 unit/mL (3 mL) InPn  Humalog KwikPen U-200 Insulin 200 unit/mL (3 mL) subcutaneous  ADMINISTER UP TO 100 UNITS UNDER THE SKIN DAILY WITH MEALS AS DIRECTED      losartan (COZAAR) 50 MG tablet losartan 50 mg tablet  TAKE 1 TABLET BY MOUTH TWICE DAILY      pantoprazole (PROTONIX) 40 MG DR tablet pantoprazole 40 mg tablet,delayed release  TAKE 1 TABLET BY MOUTH EVERY DAY FOR STOMACH ACID OR REFLUX       No current facility-administered medications on file prior to visit.     Family History Family History Problem Relation Age of Onset  Coronary Artery Disease (Blocked arteries around heart) Mother    Obesity Mother    High blood pressure (Hypertension) Mother    Hyperlipidemia (Elevated cholesterol) Mother    Diabetes Mother    Deep vein thrombosis (DVT or abnormal blood clot formation) Mother    High  blood pressure (Hypertension) Brother    Skin cancer Brother    Hyperlipidemia (Elevated cholesterol) Brother        Social History   Tobacco Use Smoking Status Never Smokeless Tobacco Never     Social History Social History    Socioeconomic History  Marital status: Widowed Tobacco Use  Smoking status: Never  Smokeless tobacco: Never Substance and Sexual Activity  Alcohol use: Never  Drug use: Never      Objective:     Vitals:   10/04/21 0856 BP: 136/80 Pulse: 98 Temp: 36.8 C (98.3 F) SpO2: 98% Weight: (!) 105.5 kg (232 lb 9.6 oz) Height: 162.6 cm (5\' 4" )   Body mass index is 39.93 kg/m.   Physical Exam Vitals reviewed.  Constitutional:      General: She is not in acute distress.    Appearance: Normal appearance.  HENT:     Head: Normocephalic and atraumatic.  Eyes:     General: No scleral icterus.    Conjunctiva/sclera: Conjunctivae normal.  Cardiovascular:     Rate and Rhythm: Normal rate and regular rhythm.     Heart sounds: No murmur heard. Pulmonary:     Effort: Pulmonary effort is normal. No respiratory distress.     Breath sounds: Normal breath sounds. No wheezing.  Abdominal:     General: There is no distension.     Palpations: Abdomen is soft.     Tenderness: There is no abdominal tenderness.     Comments: Well-healed surgical scars, scars in the upper abdomen or right upper quadrant.  Musculoskeletal:        General: No swelling or deformity. Normal range of motion.     Cervical back: Normal range of motion.  Skin:    General: Skin is warm and dry.     Coloration: Skin is not jaundiced.  Neurological:     General: No focal deficit present.     Mental Status: She is alert and oriented to person, place, and time.  Psychiatric:        Mood and Affect: Mood normal.        Behavior: Behavior normal.        Thought Content: Thought content normal.          Labs, Imaging and Diagnostic Testing: MRCP 09/16/21: IMPRESSION: 1. Study  is positive for cholelithiasis, but there is no evidence of acute cholecystitis at this time. 2. Very mild common bile duct dilatation measuring up to 9 mm in the porta hepatis. No intrahepatic biliary ductal dilatation to suggest true biliary tract obstruction at this time. Additionally, there is  no evidence of obstructing pancreatic head mass or choledocholithiasis. 3. Severe hepatic steatosis. 4. Small cystic lesions in the posterior aspect of the head of the pancreas, which have a benign appearance, favored to represent tiny pancreatic pseudocysts or side branch IPMN (intraductal papillary mucinous neoplasm).    Assessment and Plan: Diagnoses and all orders for this visit:   Calculus of gallbladder without cholecystitis without obstruction   Cystic mass of pancreas -     MRI abdomen and MRCP w wo contrast w 3D; Future       This is a 71 yo female referred for evaluation for cholecystectomy following an episode of epigastric abdominal pain with mild CBD dilation.  Based on her symptoms and presence of cholelithiasis, I agree it is likely she passed a stone via the common bile duct. I reviewed her [place] and her MRCP, both of which confirm cholelithiasis. Her MRI also shows multiple small cysts in the head of the pancreas, but no mural nodules or main PD dilation or change in caliber. I recommended laparoscopic cholecystectomy to prevent further episodes of choledocholithiasis and associated complications such as cholangitis and acute pancreatitis. The details of this procedure were discussed with the patient, including the risks of bleeding, infection, bile leak, and <0.5% risk of common bile duct injury. The patient expressed understanding and agrees to proceed with surgery. She will be contacted to schedule an elective surgery date.   Her pancreatic cysts likely represent side-branch IPMNs. In the absence of high risk features, no further workup is needed right now but these will need to be  followed. We will repeat an MRCP in 1 year for surveillance and I will see her back in clinic at that time.  Michaelle Birks, MD [facility] Surgery General, Hepatobiliary and Pancreatic Surgery 10/04/21 9:32 AM

## 2021-10-12 NOTE — Pre-Procedure Instructions (Addendum)
Surgical Instructions    Your procedure is scheduled on Friday 10/22/21.   Report to [facility] Main Entrance "A" at 06:45 A.M., then check in with the Admitting office.  Call this number if you have problems the morning of surgery:  [phone]   If you have any questions prior to your surgery date call [phone]: Open Monday-Friday 8am-4pm    Remember:  Do not eat or drink after midnight the night before your surgery   Take these medicines the morning of surgery with A SIP OF WATER:   DULoxetine (CYMBALTA)  labetalol (NORMODYNE)    As of today, STOP taking any Aspirin (unless otherwise instructed by your surgeon) Aleve, Naproxen, Ibuprofen, Motrin, Advil, Goody's, BC's, all herbal medications, fish oil, and all vitamins.  WHAT DO I DO ABOUT MY DIABETES MEDICATION?   Do not take oral diabetes medicines (pills) the morning of surgery.  The morning of surgery DO NOT TAKE insulin lispro (HUMALOG KWIKPEN).  THE NIGHT BEFORE SURGERY, take half which is 27 units of insulin glargine, 2 Unit Dial, (TOUJEO MAX SOLOSTAR)      THE MORNING OF SURGERY, take half which is 27 units of your insulin glargine, 2 Unit Dial, (TOUJEO MAX SOLOSTAR).  The day of surgery, do not take other diabetes injectables, including Byetta (exenatide), Bydureon (exenatide ER), Victoza (liraglutide), or Trulicity (dulaglutide).  If your CBG is greater than 220 mg/dL, you may take  of your sliding scale (correction) dose of insulin.   HOW TO MANAGE YOUR DIABETES BEFORE AND AFTER SURGERY  Why is it important to control my blood sugar before and after surgery? Improving blood sugar levels before and after surgery helps healing and can limit problems. A way of improving blood sugar control is eating a healthy diet by:  Eating less sugar and carbohydrates  Increasing activity/exercise  Talking with your doctor about reaching your blood sugar goals High blood sugars (greater than 180 mg/dL) can raise your  risk of infections and slow your recovery, so you will need to focus on controlling your diabetes during the weeks before surgery. Make sure that the doctor who takes care of your diabetes knows about your planned surgery including the date and location.  How do I manage my blood sugar before surgery? Check your blood sugar at least 4 times a day, starting 2 days before surgery, to make sure that the level is not too high or low.  Check your blood sugar the morning of your surgery when you wake up and every 2 hours until you get to the Short Stay unit.  If your blood sugar is less than 70 mg/dL, you will need to treat for low blood sugar: Do not take insulin. Treat a low blood sugar (less than 70 mg/dL) with  cup of clear juice (cranberry or apple), 4 glucose tablets, OR glucose gel. Recheck blood sugar in 15 minutes after treatment (to make sure it is greater than 70 mg/dL). If your blood sugar is not greater than 70 mg/dL on recheck, call [phone] for further instructions. Report your blood sugar to the short stay nurse when you get to Short Stay.  If you are admitted to the hospital after surgery: Your blood sugar will be checked by the staff and you will probably be given insulin after surgery (instead of oral diabetes medicines) to make sure you have good blood sugar levels. The goal for blood sugar control after surgery is 80-180 mg/dL.  Do not wear jewelry or makeup Do not wear lotions, powders, perfumes/colognes, or deodorant. Do not shave 48 hours prior to surgery.  Men may shave face and neck. Do not bring valuables to the hospital. Do not wear nail polish, gel polish, artificial nails, or any other type of covering on natural nails (fingers and toes) If you have artificial nails or gel coating that need to be removed by a nail salon, please have this removed prior to surgery. Artificial nails or gel coating may interfere with anesthesia's ability to adequately  monitor your vital signs.  [facility] is not responsible for any belongings or valuables. .   Do NOT Smoke (Tobacco/Vaping)  24 hours prior to your procedure  If you use a CPAP at night, you may bring your mask for your overnight stay.   Contacts, glasses, hearing aids, dentures or partials may not be worn into surgery, please bring cases for these belongings   For patients admitted to the hospital, discharge time will be determined by your treatment team.   Patients discharged the day of surgery will not be allowed to drive home, and someone needs to stay with them for 24 hours.  NO VISITORS WILL BE ALLOWED IN PRE-OP WHERE PATIENTS ARE PREPPED FOR SURGERY.  ONLY 1 SUPPORT PERSON MAY BE PRESENT IN THE WAITING ROOM WHILE YOU ARE IN SURGERY.  IF YOU ARE TO BE ADMITTED, ONCE YOU ARE IN YOUR ROOM YOU WILL BE ALLOWED TWO (2) VISITORS. 1 (ONE) VISITOR MAY STAY OVERNIGHT BUT MUST ARRIVE TO THE ROOM BY 8pm.  Minor children may have two parents present. Special consideration for safety and communication needs will be reviewed on a case by case basis.  Special instructions:    Oral Hygiene is also important to reduce your risk of infection.  Remember - BRUSH YOUR TEETH THE MORNING OF SURGERY WITH YOUR REGULAR TOOTHPASTE   - Preparing For Surgery  Before surgery, you can play an important role. Because skin is not sterile, your skin needs to be as free of germs as possible. You can reduce the number of germs on your skin by washing with CHG (chlorahexidine gluconate) Soap before surgery.  CHG is an antiseptic cleaner which kills germs and bonds with the skin to continue killing germs even after washing.     Please do not use if you have an allergy to CHG or antibacterial soaps. If your skin becomes reddened/irritated stop using the CHG.  Do not shave (including legs and underarms) for at least 48 hours prior to first CHG shower. It is OK to shave your face.  Please follow these  instructions carefully.     Shower the NIGHT BEFORE SURGERY and the MORNING OF SURGERY with CHG Soap.   If you chose to wash your hair, wash your hair first as usual with your normal shampoo. After you shampoo, rinse your hair and body thoroughly to remove the shampoo.  Then [employer] and genitals (private parts) with your normal soap and rinse thoroughly to remove soap.  After that Use CHG Soap as you would any other liquid soap. You can apply CHG directly to the skin and wash gently with a scrungie or a clean washcloth.   Apply the CHG Soap to your body ONLY FROM THE NECK DOWN.  Do not use on open wounds or open sores. Avoid contact with your eyes, ears, mouth and genitals (private parts). Wash Face and genitals (private parts)  with your normal soap.  Wash thoroughly, paying special attention to the area where your surgery will be performed.  Thoroughly rinse your body with warm water from the neck down.  DO NOT shower/wash with your normal soap after using and rinsing off the CHG Soap.  Pat yourself dry with a CLEAN TOWEL.  Wear CLEAN PAJAMAS to bed the night before surgery  Place CLEAN SHEETS on your bed the night before your surgery  DO NOT SLEEP WITH PETS.   Day of Surgery:  Take a shower with CHG soap. Wear Clean/Comfortable clothing the morning of surgery Do not apply any deodorants/lotions.   Remember to brush your teeth WITH YOUR REGULAR TOOTHPASTE.    COVID testing  If you are going to stay overnight or be admitted after your procedure/surgery and require a pre-op COVID test, please follow these instructions after your COVID test   You are not required to quarantine however you are required to wear a well-fitting mask when you are out and around people not in your household.  If your mask becomes wet or soiled, replace with a new one.  Wash your hands often with soap and water for 20 seconds or clean your hands with an alcohol-based hand sanitizer that contains at  least 60% alcohol.  Do not share personal items.  Notify your provider: if you are in close contact with someone who has COVID  or if you develop a fever of 100.4 or greater, sneezing, cough, sore throat, shortness of breath or body aches.    Please read over the following fact sheets that you were given.

## 2021-10-13 ENCOUNTER — Encounter (HOSPITAL_COMMUNITY)
Admission: RE | Admit: 2021-10-13 | Discharge: 2021-10-13 | Disposition: A | Discharge status: Home / Self Care | Payer: Medicare Other | Source: Ambulatory Visit | Attending: Surgery | Admitting: Surgery

## 2021-10-13 ENCOUNTER — Other Ambulatory Visit: Payer: Self-pay

## 2021-10-13 ENCOUNTER — Encounter (HOSPITAL_COMMUNITY): Payer: Self-pay

## 2021-10-13 VITALS — BP 147/80 | HR 72 | Temp 97.9°F | Resp 18 | Ht 64.0 in | Wt 232.9 lb

## 2021-10-13 DIAGNOSIS — Z01812 Encounter for preprocedural laboratory examination: Secondary | ICD-10-CM | POA: Diagnosis not present

## 2021-10-13 DIAGNOSIS — Z794 Long term (current) use of insulin: Secondary | ICD-10-CM | POA: Diagnosis not present

## 2021-10-13 DIAGNOSIS — E1122 Type 2 diabetes mellitus with diabetic chronic kidney disease: Secondary | ICD-10-CM | POA: Diagnosis not present

## 2021-10-13 DIAGNOSIS — Z905 Acquired absence of kidney: Secondary | ICD-10-CM | POA: Diagnosis not present

## 2021-10-13 DIAGNOSIS — I129 Hypertensive chronic kidney disease with stage 1 through stage 4 chronic kidney disease, or unspecified chronic kidney disease: Secondary | ICD-10-CM | POA: Insufficient documentation

## 2021-10-13 DIAGNOSIS — K8020 Calculus of gallbladder without cholecystitis without obstruction: Secondary | ICD-10-CM | POA: Diagnosis not present

## 2021-10-13 DIAGNOSIS — N183 Chronic kidney disease, stage 3 unspecified: Secondary | ICD-10-CM | POA: Insufficient documentation

## 2021-10-13 DIAGNOSIS — R7989 Other specified abnormal findings of blood chemistry: Secondary | ICD-10-CM | POA: Insufficient documentation

## 2021-10-13 DIAGNOSIS — Z01818 Encounter for other preprocedural examination: Secondary | ICD-10-CM

## 2021-10-13 DIAGNOSIS — E119 Type 2 diabetes mellitus without complications: Secondary | ICD-10-CM

## 2021-10-13 HISTORY — DX: Dyspnea, unspecified: R06.00

## 2021-10-13 HISTORY — DX: Pneumonia, unspecified organism: J18.9

## 2021-10-13 HISTORY — DX: Malignant (primary) neoplasm, unspecified: C80.1

## 2021-10-13 HISTORY — DX: Bursopathy, unspecified: M71.9

## 2021-10-13 LAB — CBC
HCT: 39.6 % (ref 36.0–46.0)
Hemoglobin: 12.1 g/dL (ref 12.0–15.0)
MCH: 26.1 pg (ref 26.0–34.0)
MCHC: 30.6 g/dL (ref 30.0–36.0)
MCV: 85.5 fL (ref 80.0–100.0)
Platelets: 256 10*3/uL (ref 150–400)
RBC: 4.63 MIL/uL (ref 3.87–5.11)
RDW: 14.4 % (ref 11.5–15.5)
WBC: 7.6 10*3/uL (ref 4.0–10.5)
nRBC: 0 % (ref 0.0–0.2)

## 2021-10-13 LAB — HEMOGLOBIN A1C
Hgb A1c MFr Bld: 7.7 % — ABNORMAL HIGH (ref 4.8–5.6)
Mean Plasma Glucose: 174 mg/dL

## 2021-10-13 LAB — COMPREHENSIVE METABOLIC PANEL
ALT: 12 U/L (ref 0–44)
AST: 16 U/L (ref 15–41)
Albumin: 3.6 g/dL (ref 3.5–5.0)
Alkaline Phosphatase: 63 U/L (ref 38–126)
Anion gap: 8 (ref 5–15)
BUN: 17 mg/dL (ref 8–23)
CO2: 27 mmol/L (ref 22–32)
Calcium: 9.4 mg/dL (ref 8.9–10.3)
Chloride: 104 mmol/L (ref 98–111)
Creatinine, Ser: 1.62 mg/dL — ABNORMAL HIGH (ref 0.44–1.00)
GFR, Estimated: 34 mL/min — ABNORMAL LOW (ref >60)
Glucose, Bld: 212 mg/dL — ABNORMAL HIGH (ref 70–99)
Potassium: 4.5 mmol/L (ref 3.5–5.1)
Sodium: 139 mmol/L (ref 135–145)
Total Bilirubin: 0.7 mg/dL (ref 0.3–1.2)
Total Protein: 6.6 g/dL (ref 6.5–8.1)

## 2021-10-13 LAB — GLUCOSE, CAPILLARY: Glucose-Capillary: 227 mg/dL — ABNORMAL HIGH (ref 70–99)

## 2021-10-13 NOTE — Progress Notes (Signed)
PCP - Dr. Shon Baton Cardiologist - Dr. Rex Kras  PPM/ICD - n/a Device Orders - n/a Rep Notified - n/a  Chest x-ray - denies EKG - 09/17/21 Stress Test - 09/02/19 ECHO - 09/17/21 Cardiac Cath -   Sleep Study - Approximately 17 years ago. Was not diagnosed with sleep apnea.   CPAP - denies  Fasting Blood Sugar - 227 Wears Freestyle Libre 2. Average glucose past 7 days is 146. Average glucose past month is 157.   Blood Thinner Instructions: n/a Aspirin Instructions: n/a  ERAS Protcol - No. NPO PRE-SURGERY Ensure or G2- No  COVID TEST- Ambulatory Surgery.    Anesthesia review: Yes. EKG Review.   Patient denies shortness of breath, fever, cough and chest pain at PAT appointment   All instructions explained to the patient, with a verbal understanding of the material. Patient agrees to go over the instructions while at home for a better understanding. Patient also instructed to self quarantine after being tested for COVID-19. The opportunity to ask questions was provided.

## 2021-10-14 NOTE — Anesthesia Preprocedure Evaluation (Addendum)
Anesthesia Evaluation  Patient identified by MRN, date of birth, ID band Patient awake    Reviewed: Allergy & Precautions, NPO status , Patient's Chart, lab work & pertinent test results, reviewed documented beta blocker date and time   History of Anesthesia Complications (+) history of anesthetic complications (per pt 20 yrs ago was having incisional hernia repaired and they told her she needed a pediatric ETT- has not have anesthesia since then)  Airway Mallampati: IV  TM Distance: >3 FB Neck ROM: Full  Mouth opening: Limited Mouth Opening  Dental  (+) Teeth Intact, Dental Advisory Given   Pulmonary shortness of breath and with exertion,    Pulmonary exam normal breath sounds clear to auscultation       Cardiovascular hypertension, Pt. on medications and Pt. on home beta blockers + Peripheral Vascular Disease and +CHF (grade 1 diastolic dysfunction)  Normal cardiovascular exam Rhythm:Regular Rate:Normal  Echo 2023: 1. Left ventricular ejection fraction, by estimation, is 65 to 70%. The  left ventricle has normal function. The left ventricle has no regional  wall motion abnormalities. There is mild concentric left ventricular  hypertrophy. Left ventricular diastolic  parameters are consistent with Grade I diastolic dysfunction (impaired  relaxation).  2. Right ventricular systolic function is normal. The right ventricular  size is normal. Tricuspid regurgitation signal is inadequate for assessing  PA pressure.  3. The mitral valve is normal in structure. Trivial mitral valve  regurgitation. No evidence of mitral stenosis.  4. The aortic valve is tricuspid. Aortic valve regurgitation is not  visualized. Aortic valve sclerosis is present, with no evidence of aortic  valve stenosis.  5. The inferior vena cava is normal in size with greater than 50%  respiratory variability, suggesting right atrial pressure of 3 mmHg.      previously followed by cardiologist Dr. 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  She had negative hypercoagulable work-up and echo in 2013 with bubble study was negative for shunt.  She was previously on Xarelto but reports she has stopped this due to nosebleeds.  Nuclear stress 09/02/2019 showed normal myocardial perfusion but was intermediate risk due to mildly reduced EF 42%.  Subsequent echocardiogram 09/06/2019 showed moderate concentric hypertrophy of the left ventricle, normal wall motion, grade 2 DD, EF 50-55%.  Patient was last seen by Dr. Einar Gip 07/20/2020 and advised to follow-up on an as-needed basis if she remains stable from cardiac standpoint   Neuro/Psych  Headaches, Seizures - (last one 1990, was having seizures with migraines. not currently on any seizure meds), Well Controlled,  PSYCHIATRIC DISORDERS (ADD)    GI/Hepatic negative GI ROS, Neg liver ROS,   Endo/Other  diabetes, Well Controlled, Type 2, Insulin DependentHypothyroidism Morbid obesityBMI 40 a1c 7.7 FS 130 in preop, took 5 units of insulin this AM for FS 210   Renal/GU Renal InsufficiencyRenal diseaseCr 1.62 Hx nephrectomy 2002  negative genitourinary   Musculoskeletal  (+) Arthritis , Osteoarthritis,  Fibromyalgia -  Abdominal (+) + obese,   Peds  Hematology negative hematology ROS (+)   Anesthesia Other Findings   Reproductive/Obstetrics negative OB ROS                           Anesthesia Physical Anesthesia Plan  ASA: 3  Anesthesia Plan: General   Post-op Pain Management: Tylenol PO (pre-op)*   Induction: Intravenous  PONV Risk Score and Plan: 4 or  greater and Ondansetron, Dexamethasone, Midazolam and Treatment may vary due to age or medical condition  Airway Management Planned: Oral ETT and Video Laryngoscope Planned  Additional Equipment: None  Intra-op Plan:   Post-operative Plan:  Extubation in OR  Informed Consent: I have reviewed the patients History and Physical, chart, labs and discussed the procedure including the risks, benefits and alternatives for the proposed anesthesia with the patient or authorized representative who has indicated his/her understanding and acceptance.     Dental advisory given  Plan Discussed with: CRNA  Anesthesia Plan Comments: ( )      Anesthesia Quick Evaluation

## 2021-10-14 NOTE — Progress Notes (Signed)
Anesthesia Chart Review:  Patient was previously followed by cardiologist Dr. Einar Gip for history of hypertension and recurrent embolic toe ischemia in 7341 (left great toe and right second toe).  She had negative hypercoagulable work-up and echo in 2013 with bubble study was negative for shunt.  She was previously on Xarelto but reports she has stopped this due to nosebleeds.  Nuclear stress 09/02/2019 showed normal myocardial perfusion but was intermediate risk due to mildly reduced EF 42%.  Subsequent echocardiogram 09/06/2019 showed moderate concentric hypertrophy of the left ventricle, normal wall motion, grade 2 DD, EF 50-55%.  Patient was last seen by Dr. Einar Gip 07/20/2020 and advised to follow-up on an as-needed basis if she remains stable from cardiac standpoint.  Patient recently admitted 09/16/2021 through 09/17/2021 for severe epigastric pain.  Patient did have updated echocardiogram during this admission which showed EF 65 to 70%, normal wall motion, grade 1 DD, no significant valvular abnormalities.  Epigastric pain felt secondary to gallstones for which she was referred to general surgery.  CKD 3.  History of left nephrectomy for pelvic kidney 1995.  IDDM 2, A1c 7.7 on preop labs.  Labs reviewed, creatinine elevated 1.60 which appears to be near her baseline, otherwise unremarkable.  EKG 09/16/2021: Normal sinus rhythm.  Rate 79.  LAD. Minimal voltage criteria for LVH, may be normal variant ( R in aVL ). T wave abnormality, consider lateral ischemia.  Lateral T wave abnormality present on tracings since 2013.  Echocardiogram 09/17/2021:  1. Left ventricular ejection fraction, by estimation, is 65 to 70%. The  left ventricle has normal function. The left ventricle has no regional  wall motion abnormalities. There is mild concentric left ventricular  hypertrophy. Left ventricular diastolic  parameters are consistent with Grade I diastolic dysfunction (impaired  relaxation).   2. Right  ventricular systolic function is normal. The right ventricular  size is normal. Tricuspid regurgitation signal is inadequate for assessing  PA pressure.   3. The mitral valve is normal in structure. Trivial mitral valve  regurgitation. No evidence of mitral stenosis.   4. The aortic valve is tricuspid. Aortic valve regurgitation is not  visualized. Aortic valve sclerosis is present, with no evidence of aortic  valve stenosis.   5. The inferior vena cava is normal in size with greater than 50%  respiratory variability, suggesting right atrial pressure of 3 mmHg.   Echocardiogram 09/06/2019:  Left ventricle cavity is normal in size. Moderate concentric hypertrophy of the left ventricle. Normal global wall motion. Doppler evidence of grade II (pseudonormal) diastolic dysfunction, elevated LAP. Normal LV systolic function with visual EF 50-55%. Calculated EF 50%. IVC is not well visualized.   Lexiscan (Walking with mod Bruce)Tetrofosmin Stress Test  09/02/2019: Nondiagnostic ECG stress. The heart rate response was accelerated. The baseline blood pressure was 200/116 mmHg and increased to 230/120 mmHg at peak infusion.  Resting EKG/ECG demonstrated normal sinus rhythm. Peak EKG/ECG revealed non-specific ST-T abnormality. The LV is mildly dilated both in rest and stress images.  Normal myocardial perfusion. Mild global hypokinesis. Stress LV EF is mildly dysfunctional 42%.  Findings consistent with non-ischemic cardiomyopathy.  No previous exam available for comparison. Intermediate risk study.    Wynonia Musty [facility] Short Stay Center/Anesthesiology Phone [phone] 10/14/2021 4:13 PM

## 2021-10-22 ENCOUNTER — Ambulatory Visit (HOSPITAL_COMMUNITY)
Admission: RE | Admit: 2021-10-22 | Discharge: 2021-10-22 | Disposition: A | Discharge status: Home / Self Care | Payer: Medicare Other | Attending: Surgery | Admitting: Surgery

## 2021-10-22 ENCOUNTER — Other Ambulatory Visit: Payer: Self-pay

## 2021-10-22 ENCOUNTER — Encounter (HOSPITAL_COMMUNITY): Payer: Self-pay | Admitting: Surgery

## 2021-10-22 ENCOUNTER — Ambulatory Visit (HOSPITAL_COMMUNITY): Payer: Medicare Other | Admitting: Physician Assistant

## 2021-10-22 ENCOUNTER — Encounter (HOSPITAL_COMMUNITY)
Admission: RE | Disposition: A | Discharge status: Home / Self Care | Payer: Self-pay | Source: Home / Self Care | Attending: Surgery

## 2021-10-22 ENCOUNTER — Ambulatory Visit (HOSPITAL_BASED_OUTPATIENT_CLINIC_OR_DEPARTMENT_OTHER): Payer: Medicare Other | Admitting: Anesthesiology

## 2021-10-22 DIAGNOSIS — K8012 Calculus of gallbladder with acute and chronic cholecystitis without obstruction: Secondary | ICD-10-CM | POA: Insufficient documentation

## 2021-10-22 DIAGNOSIS — I509 Heart failure, unspecified: Secondary | ICD-10-CM

## 2021-10-22 DIAGNOSIS — Z86718 Personal history of other venous thrombosis and embolism: Secondary | ICD-10-CM | POA: Diagnosis not present

## 2021-10-22 DIAGNOSIS — K8020 Calculus of gallbladder without cholecystitis without obstruction: Secondary | ICD-10-CM

## 2021-10-22 DIAGNOSIS — Z90710 Acquired absence of both cervix and uterus: Secondary | ICD-10-CM | POA: Insufficient documentation

## 2021-10-22 DIAGNOSIS — K760 Fatty (change of) liver, not elsewhere classified: Secondary | ICD-10-CM | POA: Diagnosis not present

## 2021-10-22 DIAGNOSIS — Z905 Acquired absence of kidney: Secondary | ICD-10-CM | POA: Insufficient documentation

## 2021-10-22 DIAGNOSIS — I110 Hypertensive heart disease with heart failure: Secondary | ICD-10-CM | POA: Diagnosis not present

## 2021-10-22 DIAGNOSIS — Z7901 Long term (current) use of anticoagulants: Secondary | ICD-10-CM | POA: Diagnosis not present

## 2021-10-22 HISTORY — PX: CHOLECYSTECTOMY: SHX55

## 2021-10-22 LAB — GLUCOSE, CAPILLARY
Glucose-Capillary: 184 mg/dL — ABNORMAL HIGH (ref 70–99)
Glucose-Capillary: 132 mg/dL — ABNORMAL HIGH (ref 70–99)
Glucose-Capillary: 123 mg/dL — ABNORMAL HIGH (ref 70–99)

## 2021-10-22 SURGERY — LAPAROSCOPIC CHOLECYSTECTOMY
Anesthesia: General | Site: Abdomen

## 2021-10-22 MED ORDER — CHLORHEXIDINE GLUCONATE 0.12 % MT SOLN
OROMUCOSAL | Status: AC
  Administered 2021-10-22: 15 mL via OROMUCOSAL
  Filled 2021-10-22: qty 15

## 2021-10-22 MED ORDER — LIDOCAINE 2% (20 MG/ML) 5 ML SYRINGE
INTRAMUSCULAR | Status: DC | PRN
  Administered 2021-10-22: 60 mg via INTRAVENOUS

## 2021-10-22 MED ORDER — FENTANYL CITRATE (PF) 250 MCG/5ML IJ SOLN
INTRAMUSCULAR | Status: DC | PRN
Start: 2021-10-22 — End: 2021-10-22
  Administered 2021-10-22: 50 ug via INTRAVENOUS
  Administered 2021-10-22: 100 ug via INTRAVENOUS

## 2021-10-22 MED ORDER — LACTATED RINGERS IV SOLN: INTRAVENOUS | Status: DC

## 2021-10-22 MED ORDER — HYDROMORPHONE HCL 1 MG/ML IJ SOLN
0.2500 mg | INTRAMUSCULAR | Status: DC | PRN
  Administered 2021-10-22 (×2): 0.5 mg via INTRAVENOUS

## 2021-10-22 MED ORDER — ACETAMINOPHEN 500 MG PO TABS
ORAL_TABLET | ORAL | Status: AC
  Administered 2021-10-22: 1000 mg via ORAL
  Filled 2021-10-22: qty 2

## 2021-10-22 MED ORDER — LABETALOL HCL 100 MG PO TABS
100.0000 mg | ORAL_TABLET | Freq: Once | ORAL | Status: AC
  Administered 2021-10-22: 100 mg via ORAL
  Filled 2021-10-22: qty 1

## 2021-10-22 MED ORDER — BUPIVACAINE-EPINEPHRINE (PF) 0.25% -1:200000 IJ SOLN
INTRAMUSCULAR | Status: AC
  Filled 2021-10-22: qty 30

## 2021-10-22 MED ORDER — OXYCODONE HCL 5 MG/5ML PO SOLN: 5.0000 mg | Freq: Once | ORAL | Status: AC | PRN

## 2021-10-22 MED ORDER — SUGAMMADEX SODIUM 200 MG/2ML IV SOLN
INTRAVENOUS | Status: DC | PRN
Start: 2021-10-22 — End: 2021-10-22
  Administered 2021-10-22: 200 mg via INTRAVENOUS

## 2021-10-22 MED ORDER — ONDANSETRON HCL 4 MG/2ML IJ SOLN
INTRAMUSCULAR | Status: DC | PRN
Start: 2021-10-22 — End: 2021-10-22
  Administered 2021-10-22: 4 mg via INTRAVENOUS

## 2021-10-22 MED ORDER — ACETAMINOPHEN 500 MG PO TABS: 1000.0000 mg | ORAL_TABLET | ORAL | Status: AC

## 2021-10-22 MED ORDER — ACETAMINOPHEN 500 MG PO TABS: 1000.0000 mg | ORAL_TABLET | Freq: Once | ORAL | Status: AC

## 2021-10-22 MED ORDER — ROCURONIUM BROMIDE 10 MG/ML (PF) SYRINGE
PREFILLED_SYRINGE | INTRAVENOUS | Status: DC | PRN
  Administered 2021-10-22: 80 mg via INTRAVENOUS

## 2021-10-22 MED ORDER — SODIUM CHLORIDE 0.9 % IR SOLN
Status: DC | PRN
  Administered 2021-10-22: 1

## 2021-10-22 MED ORDER — ORAL CARE MOUTH RINSE: 15.0000 mL | Freq: Once | OROMUCOSAL | Status: AC

## 2021-10-22 MED ORDER — MIDAZOLAM HCL 2 MG/2ML IJ SOLN
INTRAMUSCULAR | Status: DC | PRN
  Administered 2021-10-22: 2 mg via INTRAVENOUS

## 2021-10-22 MED ORDER — MIDAZOLAM HCL 2 MG/2ML IJ SOLN
INTRAMUSCULAR | Status: AC
  Filled 2021-10-22: qty 2

## 2021-10-22 MED ORDER — FENTANYL CITRATE (PF) 250 MCG/5ML IJ SOLN
INTRAMUSCULAR | Status: AC
  Filled 2021-10-22: qty 5

## 2021-10-22 MED ORDER — OXYCODONE HCL 5 MG PO TABS
5.0000 mg | ORAL_TABLET | Freq: Once | ORAL | Status: AC | PRN
  Administered 2021-10-22: 5 mg via ORAL

## 2021-10-22 MED ORDER — HYDROMORPHONE HCL 1 MG/ML IJ SOLN
INTRAMUSCULAR | Status: AC
  Filled 2021-10-22: qty 1

## 2021-10-22 MED ORDER — OXYCODONE HCL 5 MG PO TABS
ORAL_TABLET | ORAL | Status: AC
  Filled 2021-10-22: qty 1

## 2021-10-22 MED ORDER — DEXAMETHASONE SODIUM PHOSPHATE 10 MG/ML IJ SOLN
INTRAMUSCULAR | Status: DC | PRN
  Administered 2021-10-22: 4 mg via INTRAVENOUS

## 2021-10-22 MED ORDER — 0.9 % SODIUM CHLORIDE (POUR BTL) OPTIME
TOPICAL | Status: DC | PRN
  Administered 2021-10-22: 1000 mL

## 2021-10-22 MED ORDER — CHLORHEXIDINE GLUCONATE 0.12 % MT SOLN: 15.0000 mL | Freq: Once | OROMUCOSAL | Status: AC

## 2021-10-22 MED ORDER — CEFAZOLIN SODIUM-DEXTROSE 2-4 GM/100ML-% IV SOLN
INTRAVENOUS | Status: AC
  Filled 2021-10-22: qty 100

## 2021-10-22 MED ORDER — HYDROCODONE-ACETAMINOPHEN 5-325 MG PO TABS: 1.0000 | ORAL_TABLET | Freq: Four times a day (QID) | ORAL | 0 refills | Status: DC | PRN

## 2021-10-22 MED ORDER — PROPOFOL 10 MG/ML IV BOLUS
INTRAVENOUS | Status: DC | PRN
  Administered 2021-10-22: 150 mg via INTRAVENOUS

## 2021-10-22 MED ORDER — CEFAZOLIN SODIUM-DEXTROSE 2-4 GM/100ML-% IV SOLN: 2.0000 g | INTRAVENOUS | Status: DC

## 2021-10-22 MED ORDER — ONDANSETRON HCL 4 MG/2ML IJ SOLN: 4.0000 mg | Freq: Once | INTRAMUSCULAR | Status: DC | PRN

## 2021-10-22 MED ORDER — AMISULPRIDE (ANTIEMETIC) 5 MG/2ML IV SOLN: 10.0000 mg | Freq: Once | INTRAVENOUS | Status: DC | PRN

## 2021-10-22 MED ORDER — BUPIVACAINE-EPINEPHRINE 0.25% -1:200000 IJ SOLN
INTRAMUSCULAR | Status: DC | PRN
  Administered 2021-10-22: 30 mL

## 2021-10-22 SURGICAL SUPPLY — 45 items
ADH SKN CLS APL DERMABOND .7 (GAUZE/BANDAGES/DRESSINGS) ×1
APL PRP STRL LF DISP 70% ISPRP (MISCELLANEOUS) ×1
APPLIER CLIP 5 13 M/L LIGAMAX5 (MISCELLANEOUS) ×2
APR CLP MED LRG 5 ANG JAW (MISCELLANEOUS) ×1
BAG COUNTER SPONGE SURGICOUNT (BAG) ×2 IMPLANT
BAG SPEC RTRVL LRG 6X4 10 (ENDOMECHANICALS) ×1
BAG SPNG CNTER NS LX DISP (BAG) ×1
BLADE CLIPPER SURG (BLADE) IMPLANT
CANISTER SUCT 3000ML PPV (MISCELLANEOUS) ×2 IMPLANT
CHLORAPREP W/TINT 26 (MISCELLANEOUS) ×2 IMPLANT
CLIP APPLIE 5 13 M/L LIGAMAX5 (MISCELLANEOUS) ×1 IMPLANT
COVER SURGICAL LIGHT HANDLE (MISCELLANEOUS) ×2 IMPLANT
DERMABOND ADVANCED (GAUZE/BANDAGES/DRESSINGS) ×1
DERMABOND ADVANCED .7 DNX12 (GAUZE/BANDAGES/DRESSINGS) ×1 IMPLANT
ELECT REM PT RETURN 9FT ADLT (ELECTROSURGICAL) ×2
ELECTRODE REM PT RTRN 9FT ADLT (ELECTROSURGICAL) ×1 IMPLANT
GLOVE SURG POLY MICRO LF SZ5.5 (GLOVE) ×2 IMPLANT
GLOVE SURG UNDER POLY LF SZ6 (GLOVE) ×2 IMPLANT
GOWN STRL REUS W/ TWL LRG LVL3 (GOWN DISPOSABLE) ×3 IMPLANT
GOWN STRL REUS W/TWL LRG LVL3 (GOWN DISPOSABLE) ×8
KIT BASIN OR (CUSTOM PROCEDURE TRAY) ×2 IMPLANT
KIT TURNOVER KIT B (KITS) ×2 IMPLANT
L-HOOK LAP DISP 36CM (ELECTROSURGICAL) ×2
LHOOK LAP DISP 36CM (ELECTROSURGICAL) ×1 IMPLANT
NDL INSUFFLATION 14GA 120MM (NEEDLE) IMPLANT
NEEDLE INSUFFLATION 14GA 120MM (NEEDLE) IMPLANT
NS IRRIG 1000ML POUR BTL (IV SOLUTION) ×2 IMPLANT
PAD ARMBOARD 7.5X6 YLW CONV (MISCELLANEOUS) ×5 IMPLANT
PENCIL BUTTON HOLSTER BLD 10FT (ELECTRODE) ×2 IMPLANT
POUCH SPECIMEN RETRIEVAL 10MM (ENDOMECHANICALS) ×2 IMPLANT
SCISSORS LAP 5X35 DISP (ENDOMECHANICALS) ×2 IMPLANT
SET IRRIG TUBING LAPAROSCOPIC (IRRIGATION / IRRIGATOR) ×2 IMPLANT
SET TUBE SMOKE EVAC HIGH FLOW (TUBING) ×2 IMPLANT
SLEEVE ENDOPATH XCEL 5M (ENDOMECHANICALS) ×4 IMPLANT
SUT MNCRL AB 4-0 PS2 18 (SUTURE) ×2 IMPLANT
SUT VIC AB 3-0 SH 27 (SUTURE)
SUT VIC AB 3-0 SH 27XBRD (SUTURE) IMPLANT
TOWEL GREEN STERILE (TOWEL DISPOSABLE) ×2 IMPLANT
TOWEL GREEN STERILE FF (TOWEL DISPOSABLE) ×2 IMPLANT
TRAY LAPAROSCOPIC MC (CUSTOM PROCEDURE TRAY) ×2 IMPLANT
TROCAR XCEL 12X100 BLDLESS (ENDOMECHANICALS) IMPLANT
TROCAR XCEL BLUNT TIP 100MML (ENDOMECHANICALS) ×2 IMPLANT
TROCAR XCEL NON-BLD 5MMX100MML (ENDOMECHANICALS) ×2 IMPLANT
WARMER LAPAROSCOPE (MISCELLANEOUS) ×2 IMPLANT
WATER STERILE IRR 1000ML POUR (IV SOLUTION) ×1 IMPLANT

## 2021-10-22 NOTE — Anesthesia Procedure Notes (Addendum)
Procedure Name: Intubation ?Date/Time: 10/22/2021 8:31 AM ?Performed by: Leonor Liv, CRNA ?Pre-anesthesia Checklist: Patient identified, Emergency Drugs available, Suction available, Patient being monitored and Timeout performed ?Patient Re-evaluated:Patient Re-evaluated prior to induction ?Preoxygenation: Pre-oxygenation with 100% oxygen ?Induction Type: IV induction ?Ventilation: Mask ventilation without difficulty and Oral airway inserted - appropriate to patient size ?Laryngoscope Size: Glidescope and 3 ?Grade View: Grade I ?Tube type: Subglottic suction tube ?Tube size: 7.0 mm ?Number of attempts: 1 ?Airway Equipment and Method: Stylet, Video-laryngoscopy and Rigid stylet ?Placement Confirmation: ETT inserted through vocal cords under direct vision, breath sounds checked- equal and bilateral and CO2 detector ?Secured at: 20 cm ?Tube secured with: Tape ?Dental Injury: Teeth and Oropharynx as per pre-operative assessment  ?Difficulty Due To: Difficulty was anticipated and Difficult Airway- due to limited oral opening ?Comments: Hx difficult intubation  ? ? ? ? ?

## 2021-10-22 NOTE — Progress Notes (Signed)
Repeat cbg 132 reported to anesthesia. No new orders at this time.  ?

## 2021-10-22 NOTE — Progress Notes (Signed)
Patient is resting in recliner chair awaiting friend to transport home alert talkative skin warm and dry resp even unlabored ?

## 2021-10-22 NOTE — Interval H&P Note (Signed)
History and Physical Interval Note: ? ?10/22/2021 ?7:47 AM ? ?Debra Jordan  has presented today for surgery, with the diagnosis of gallstones.  The various methods of treatment have been discussed with the patient and family. After consideration of risks, benefits and other options for treatment, the patient has consented to  Procedure(s): ?LAPAROSCOPIC CHOLECYSTECTOMY (N/A) as a surgical intervention.  The patient's history has been reviewed, patient examined, no change in status, stable for surgery. Had another episode of epigastric pain a few days ago, which resolved after a few hours. I have reviewed the patient's chart and labs.  Questions were answered to the patient's satisfaction.  Plan for discharge from PACU, discharge instructions reviewed. ? ? ?Dwan Bolt ? ? ?

## 2021-10-22 NOTE — Anesthesia Postprocedure Evaluation (Signed)
Anesthesia Post Note ? ?Patient: Debra Jordan ? ?Procedure(s) Performed: LAPAROSCOPIC CHOLECYSTECTOMY (Abdomen) ? ?  ? ?Patient location during evaluation: PACU ?Anesthesia Type: General ?Level of consciousness: awake and alert, oriented and patient cooperative ?Pain management: pain level controlled ?Vital Signs Assessment: post-procedure vital signs reviewed and stable ?Respiratory status: spontaneous breathing, nonlabored ventilation and respiratory function stable ?Cardiovascular status: blood pressure returned to baseline and stable ?Postop Assessment: no apparent nausea or vomiting ?Anesthetic complications: no ? ? ?No notable events documented. ? ?Last Vitals:  ?Vitals:  ? 10/22/21 1000 10/22/21 1015  ?BP: (!) 148/75 139/75  ?Pulse: 64 68  ?Resp: 13 18  ?Temp: 36.8 ?C   ?SpO2: 93% 93%  ?  ?Last Pain:  ?Vitals:  ? 10/22/21 1015  ?TempSrc:   ?PainSc: 3   ? ? ?  ?  ?  ?  ?  ?  ? ?Jarome Matin Devonna Oboyle ? ? ? ? ?

## 2021-10-22 NOTE — Op Note (Signed)
Date: 10/22/21 ? ?Patient: [name] ?MRN: [id number] ? ?Preoperative Diagnosis: Symptomatic cholelithiasis ?Postoperative Diagnosis: Same ? ?Procedure: Laparoscopic cholecystectomy ? ?Surgeon: Michaelle Birks, MD ? ?EBL: Minimal ? ?Anesthesia: General endotracheal ? ?Specimens: Gallbladder ? ?Indications: Debra Jordan is a 71 yo female who recently presented to the ED with severe epigastric abdominal pain. Imaging workup showed gallstones with mild CBD dilation but no filling defects or masses on MRCP, and no LFT abnormalities. She was suspected to have passed a stone via the common bile duct. She was evaluated in clinic and after a discussion of the risks and benefits of surgery, agreed to proceed with elective cholecystectomy. ? ?Findings: Cholelithiasis without signs of acute cholecystitis. Hepatic steatosis. ? ?Procedure details: Informed consent was obtained in the preoperative area prior to the procedure. The patient was brought to the operating room and placed on the table in the supine position. General anesthesia was induced and appropriate lines and drains were placed for intraoperative monitoring. Perioperative antibiotics were administered per SCIP guidelines. The abdomen was prepped and draped in the usual sterile fashion. A pre-procedure timeout was taken verifying patient identity, surgical site and procedure to be performed. ? ?A small supraumbilical skin incision was made, the subcutaneous tissue was divided with cautery, and the umbilical stalk was grasped and elevated. The fascia was incised and the peritoneal cavity was directly visualized. A 4mm Hassan trocar was placed. The peritoneal cavity was inspected with no evidence of visceral or vascular injury. Three 15mm ports were placed in the right subcostal margin, all under direct visualization. The fundus of the gallbladder was grasped and retracted cephalad. The liver was mildly enlarged and steatotic in appearance, but not cirrhotic. The  infundibulum of the gallbladder was retracted laterally. The cystic triangle was dissected out using cautery and blunt dissection, and the critical view of safety was obtained. The cystic duct appeared relatively short and was clipped close to the gallbladder and ligated, leaving 2 clips behind on the cystic duct stump. The cystic artery was then clipped and divided. The gallbladder was taken off the liver using cautery. The specimen was placed in an endocatch bag. The surgical site was irrigated with saline until the effluent was clear. Hemostasis was achieved in the gallbladder fossa using cautery. The cystic duct and artery stumps were visually inspected and there was no evidence of bile leak or bleeding. The ports were removed under direct visualization and the abdomen was desufflated, and the gallbladder was removed via the umbilical port. The umbilical port site fascia was closed with a 0 vicryl suture. The skin at all port sites was closed with 4-0 monocryl subcuticular suture. Dermabond was applied. ? ?The patient tolerated the procedure well with no apparent complications. All counts were correct x2 at the end of the procedure. The patient was extubated and taken to PACU in stable condition. ? ?Michaelle Birks, MD ?10/22/21 ?9:41 AM ? ? ?

## 2021-10-22 NOTE — Transfer of Care (Signed)
Immediate Anesthesia Transfer of Care Note ? ?Patient: Debra Jordan ? ?Procedure(s) Performed: LAPAROSCOPIC CHOLECYSTECTOMY (Abdomen) ? ?Patient Location: PACU ? ?Anesthesia Type:General ? ?Level of Consciousness: awake, alert  and oriented ? ?Airway & Oxygen Therapy: Patient Spontanous Breathing and Patient connected to nasal cannula oxygen ? ?Post-op Assessment: Report given to RN and Post -op Vital signs reviewed and stable ? ?Post vital signs: Reviewed and stable ? ?Last Vitals:  ?Vitals Value Taken Time  ?BP 147/123 10/22/21 0933  ?Temp    ?Pulse 65 10/22/21 0936  ?Resp 12 10/22/21 0936  ?SpO2 98 % 10/22/21 0936  ?Vitals shown include unvalidated device data. ? ?Last Pain:  ?Vitals:  ? 10/22/21 0702  ?TempSrc:   ?PainSc: 0-No pain  ?   ? ?Patients Stated Pain Goal: 0 (10/22/21 1991) ? ?Complications: No notable events documented. ?

## 2021-10-22 NOTE — Discharge Instructions (Addendum)
[facility] DISCHARGE INSTRUCTIONS ? ?Activity ?No heavy lifting greater than 15 pounds for 2 weeks after surgery. ?Ok to shower in 24 hours, but do not bathe or submerge incisions underwater. ?Do not drive while taking narcotic pain medication. ? ?Wound Care ?Your incisions are covered with skin glue called Dermabond. This will peel off on its own over time. ?You may shower and allow warm soapy water to run over your incisions. Gently pat dry. ?Do not submerge your incision underwater. ?Monitor your incision for any new redness, tenderness, or drainage. ? ?When to Call us: ?Fever greater than 100.5 ?New redness, drainage, or swelling at incision site ?Severe pain, nausea, or vomiting ?Jaundice (yellowing of the whites of the eyes or skin) ? ?Follow-up ?You have an appointment scheduled with Dr. Zenia Resides on November 16, 2021 at 9:20am. This will be at the [facility] office at [address] [place], [place], [place]. Please arrive at least 15 minutes prior to your scheduled appointment time. ? ?For questions or concerns, please call the office at [phone]. ? ?

## 2021-10-22 NOTE — Progress Notes (Signed)
Cbg 184 on arrival. Patient stated she gave herself 5 units of rapid acting insulin this am at home. Anesthesia made aware and ordered to recheck cbg in 30 minutes.  ?

## 2021-10-23 ENCOUNTER — Encounter (HOSPITAL_COMMUNITY): Payer: Self-pay | Admitting: Surgery

## 2021-10-25 LAB — SURGICAL PATHOLOGY

## 2021-11-05 ENCOUNTER — Encounter (INDEPENDENT_AMBULATORY_CARE_PROVIDER_SITE_OTHER): Payer: Medicare Other | Admitting: Ophthalmology

## 2021-11-22 NOTE — Progress Notes (Shared)
?[facility] Note ? ?11/23/2021 ? ?  ? ?CHIEF COMPLAINT ?Patient presents for No chief complaint on file. ? ? ? ?HISTORY OF PRESENT ILLNESS: ?Debra Jordan is a 71 y.o. female who presents to the clinic today for:  ? ? ?Patient states her old glasses broke, so she has been getting different strength reading glasses to use, she is wearing a +2.50 today, but says she sometimes has to go as high as a +3.00 ? ?Referring physician: ?Shon Baton, MD ?[address] ?[place],  [place] [address] ? ?HISTORICAL INFORMATION:  ?Selected notes from the [facility] ?Referral from Dr. Parke Simmers for eval of NPDR w/DME OU. ?VA cc: OD: 20/30- OS: 20/50 ?Wearing Rx: OD: +1.50+0.25x1 OS: +1.00+0.25x102 ?MRx: OD: +1.25+1.25x180 20/25 OS: +1.25 sph 20/40 ?IOPS 19,17 ?Mixed form cataract OU, Htn Ret OU ?  ? ?CURRENT MEDICATIONS: ?No current outpatient medications on file. (Ophthalmic Drugs)  ? ?No current facility-administered medications for this visit. (Ophthalmic Drugs)  ? ?Current Outpatient Medications (Other)  ?Medication Sig  ? cyclobenzaprine (FLEXERIL) 10 MG tablet Take 10 mg by mouth at bedtime.  ? DULoxetine (CYMBALTA) 30 MG capsule Take 30 mg by mouth 2 (two) times daily.  ? HYDROcodone-acetaminophen (NORCO/VICODIN) 5-325 MG tablet Take 1 tablet by mouth every 6 (six) hours as needed for severe pain.  ? insulin glargine, 2 Unit Dial, (TOUJEO MAX SOLOSTAR) 300 UNIT/ML Solostar Pen Inject 54 Units into the skin 2 (two) times daily.  ? insulin lispro (HUMALOG KWIKPEN) 200 UNIT/ML KwikPen Inject 10-25 Units into the skin 3 (three) times daily with meals as needed (high blood sugar).  ? labetalol (NORMODYNE) 100 MG tablet Take 100 mg by mouth 2 (two) times daily.  ? levothyroxine (SYNTHROID) 112 MCG tablet Take 112 mcg by mouth at bedtime.  ? losartan (COZAAR) 50 MG tablet Take 50 mg by mouth 2 (two) times daily.  ? naproxen sodium (ALEVE) 220 MG tablet Take 440 mg by mouth 2 (two) times daily as  needed (pain).  ? ?No current facility-administered medications for this visit. (Other)  ? ?REVIEW OF SYSTEMS: ? ?ALLERGIES ?Allergies  ?Allergen Reactions  ? Statins Other (See Comments)  ?  Severe myalgia and abdominal cramps  ? Norvasc [Amlodipine]   ?  Difficulty breathing   ? Sulfa Antibiotics Nausea Only  ? Latex Rash  ?  Occasional SOB  ? Penicillins Rash  ?  angioedema  ? ?PAST MEDICAL HISTORY ?Past Medical History:  ?Diagnosis Date  ? ADD (attention deficit disorder)   ? Arthritis   ? gout  ? Blue toe syndrome ([place]) 10/06/2011  ? Broken shoulder   ? And tailbone   ? Bursitis   ? Hip and Shoulder per patient  ? Cancer [facility])   ? Skin Cancer  ? Cataract   ? Mixed form OU  ? Chronic kidney disease   ? hx of left nephrectomy  ? Diabetes mellitus   ? type 2  ? Diabetic retinopathy ([place])   ? NPDR OU  ? DM w/o complication type II, uncontrolled 09/06/2011  ? Dyspnea   ? Since having COVID  ? Fatty liver   ? Fibromyalgia   ? Gout 09/06/2011  ? History of headache   ? History of kidney stones   ? History of nephrectomy, unilateral 09/06/2011  ? History of PCOS   ? History of venous thrombosis and embolism   ? Left great toe  ? Hyperlipidemia   ? Hypersomnolence   ?  Hypertension   ? Hypertensive retinopathy   ? OU  ? Hypothyroid 09/06/2011  ? Hypothyroidism   ? Ischemia of extremity 09/06/2011  ? Migraine headache 09/06/2011  ? Neuromuscular disorder ([place])   ? neuropathy to feet bilateral  ? Obesity   ? Pneumonia   ? Seizure disorder ([place])   ? Trouble swallowing   ? ?Past Surgical History:  ?Procedure Laterality Date  ? ABDOMINAL HYSTERECTOMY    ? CHOLECYSTECTOMY N/A 10/22/2021  ? Procedure: LAPAROSCOPIC CHOLECYSTECTOMY;  Surgeon: Dwan Bolt, MD;  Location: [facility];  Service: General;  Laterality: N/A;  ? HERNIA REPAIR    ? LYMPH NODE BIOPSY    ? NASAL SINUS SURGERY    ? NEPHRECTOMY Left 2002  ? left nephrectomy  ? TONSILLECTOMY  1969  ? TUBAL LIGATION    ? ? ?FAMILY HISTORY ?Family History  ?Problem Relation Age of  Onset  ? Other Mother   ?     blood clot history, varicose veins  ? Stroke Mother   ? Diabetes Mother   ? Cancer Father   ? Stroke Maternal Grandmother   ? Healthy Brother   ? Healthy Brother   ? Healthy Brother   ? Diabetes Daughter   ? ? ?SOCIAL HISTORY ?Social History  ? ?Tobacco Use  ? Smoking status: Never  ? Smokeless tobacco: Never  ?Vaping Use  ? Vaping Use: Never used  ?Substance Use Topics  ? Alcohol use: No  ? Drug use: No  ?  ? ?  ?OPHTHALMIC EXAM: ?Not recorded ?  ? ? ?IMAGING AND PROCEDURES  ?Imaging and Procedures for '@TODAY' @ ? ? ?  ?  ? ?  ?ASSESSMENT/PLAN: ?  ICD-10-CM   ?1. Moderate nonproliferative diabetic retinopathy of both eyes with macular edema associated with type 2 diabetes mellitus ([place])  H29.9242   ?  ?2. Retinal edema  H35.81   ?  ?3. Epiretinal membrane (ERM) of right eye  H35.371   ?  ?4. Essential hypertension  I10   ?  ?5. Hypertensive retinopathy of both eyes  H35.033   ?  ?6. 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  ? ? ?1,2. Moderate Non-proliferative diabetic retinopathy w/ DME OU ? - A1c improved to 6.2 from 8 per pt report ? - exam shows scattered MA and Spink OU; no NV ? - FA (04.20.21) w/ leaking MA OU, no NV ? - BCVA 20/25 OU -- decreased ? - OCT shows OD: ERM and progressive blunting of foveal contour, interval increase in central trace cystic changes, partial PVD; OS: Interval release of VMA to partial PVD ? - pt reports recent stressors have impacted BG control -- pt has had Covid twice ? - no intervention recommended -- continue monitoring ? - f/u in 5 months, sooner prn -- DFE, OCT ? ?3. Epiretinal membrane, OD ?- mild ERM w/ blunted foveal contour-- first noted 5.11.22 ?- BCVA 20/25 (dec from 20/20) ?- asymptomatic, no metamorphopsia ?- OCT shows progressive blunting of foveal contour, interval increaese in central trace cystic changes ?- no indication for surgery at this time ?- monitor for now ?- f/u 5 mos -- DFE/OCT ? ?4,5. Hypertensive  retinopathy OU ? - discussed importance of tight BP control ? - monitor ? ?6. Mixed cataract OU ? - The symptoms of cataract, surgical options, and treatments and risks were discussed with patient. ? - discussed diagnosis and mild progression ? ? ?Ophthalmic Meds Ordered this visit:  ?  No orders of the defined types were placed in this encounter. ? ?  ? ?No follow-ups on file. ? ?There are no Patient Instructions on file for this visit. ? ?This document serves as a record of services personally performed by Gardiner Sleeper, MD, PhD. It was created on their behalf by San Jetty. Owens Shark, OA an ophthalmic technician. The creation of this record is the provider's dictation and/or activities during the visit.   ? ?Electronically signed by: San Jetty. Owens Shark, [place] 04.03.2023 8:07 AM ? ? ?Gardiner Sleeper, M.D., Ph.D. ?Diseases & Surgery of the Retina and Vitreous ?[place] ? ? ?Abbreviations: ?M myopia (nearsighted); A astigmatism; H hyperopia (farsighted); P presbyopia; Mrx spectacle prescription;  CTL contact lenses; OD right eye; OS left eye; OU both eyes  XT exotropia; ET esotropia; PEK punctate epithelial keratitis; PEE punctate epithelial erosions; DES dry eye syndrome; MGD meibomian gland dysfunction; ATs artificial tears; PFAT's preservative free artificial tears; Nixon nuclear sclerotic cataract; PSC posterior subcapsular cataract; ERM epi-retinal membrane; PVD posterior vitreous detachment; RD retinal detachment; DM diabetes mellitus; DR diabetic retinopathy; NPDR non-proliferative diabetic retinopathy; PDR proliferative diabetic retinopathy; CSME clinically significant macular edema; DME diabetic macular edema; dbh dot blot hemorrhages; CWS cotton wool spot; POAG primary open angle glaucoma; C/D cup-to-disc ratio; HVF humphrey visual field; GVF goldmann visual field; OCT optical coherence tomography; IOP intraocular pressure; BRVO Branch retinal vein occlusion; CRVO central retinal vein  occlusion; CRAO central retinal artery occlusion; BRAO branch retinal artery occlusion; RT retinal tear; SB scleral buckle; PPV pars plana vitrectomy; VH Vitreous hemorrhage; PRP panretinal laser photocoagulation; IVK

## 2021-11-23 ENCOUNTER — Encounter (INDEPENDENT_AMBULATORY_CARE_PROVIDER_SITE_OTHER): Payer: Medicare Other | Admitting: Ophthalmology

## 2021-11-23 DIAGNOSIS — H35033 Hypertensive retinopathy, bilateral: Secondary | ICD-10-CM

## 2021-11-23 DIAGNOSIS — I10 Essential (primary) hypertension: Secondary | ICD-10-CM

## 2021-11-23 DIAGNOSIS — H25813 Combined forms of age-related cataract, bilateral: Secondary | ICD-10-CM

## 2021-11-23 DIAGNOSIS — H35371 Puckering of macula, right eye: Secondary | ICD-10-CM

## 2021-11-23 DIAGNOSIS — H3581 Retinal edema: Secondary | ICD-10-CM

## 2021-11-23 DIAGNOSIS — E113313 Type 2 diabetes mellitus with moderate nonproliferative diabetic retinopathy with macular edema, bilateral: Secondary | ICD-10-CM

## 2022-05-13 DIAGNOSIS — M25511 Pain in right shoulder: Secondary | ICD-10-CM

## 2022-05-13 HISTORY — DX: Pain in right shoulder: M25.511

## 2022-08-12 ENCOUNTER — Other Ambulatory Visit (INDEPENDENT_AMBULATORY_CARE_PROVIDER_SITE_OTHER): Payer: Medicare Other

## 2022-08-12 ENCOUNTER — Ambulatory Visit: Payer: Medicare Other | Admitting: Physician Assistant

## 2022-08-12 ENCOUNTER — Encounter: Payer: Self-pay | Admitting: Physician Assistant

## 2022-08-12 VITALS — BP 189/111 | HR 95 | Resp 20 | Ht 64.0 in | Wt 242.0 lb

## 2022-08-12 DIAGNOSIS — R413 Other amnesia: Secondary | ICD-10-CM

## 2022-08-12 DIAGNOSIS — F32A Depression, unspecified: Secondary | ICD-10-CM

## 2022-08-12 DIAGNOSIS — R404 Transient alteration of awareness: Secondary | ICD-10-CM

## 2022-08-12 DIAGNOSIS — R4182 Altered mental status, unspecified: Secondary | ICD-10-CM

## 2022-08-12 NOTE — Patient Instructions (Addendum)
It was a pleasure to see you today at our office.   Recommendations:  Follow up in 1 month MRI brain EEG  Neurocognitive testing Check B12  Referral to Psychiatry   Whom to call:  Memory  decline, memory medications: Call our office [phone]   For psychiatric meds, mood meds: Please have your primary care physician manage these medications.   Counseling regarding caregiver distress, including caregiver depression, anxiety and issues regarding community resources, adult day care programs, adult living facilities, or memory care questions:   Feel free to contact [place], Social Worker at [phone]   For assessment of decision of mental capacity and competency:  Call Dr. Anthoney Harada, geriatric psychiatrist at [phone]   Mri at  [place], [phone] Labs today [address]    If you have any severe symptoms of a stroke, or other severe issues such as confusion,severe chills or fever, etc call 911 or go to the ER as you may need to be evaluated further    RECOMMENDATIONS FOR ALL PATIENTS WITH MEMORY PROBLEMS: 1. Continue to exercise (Recommend 30 minutes of walking everyday, or 3 hours every week) 2. Increase social interactions - continue going to [place] and enjoy social gatherings with friends and family 3. Eat healthy, avoid fried foods and eat more fruits and vegetables 4. Maintain adequate blood pressure, blood sugar, and blood cholesterol level. Reducing the risk of stroke and cardiovascular disease also helps promoting better memory. 5. Avoid stressful situations. Live a simple life and avoid aggravations. Organize your time and prepare for the next day in anticipation. 6. Sleep well, avoid any interruptions of sleep and avoid any distractions in the bedroom that may interfere with adequate sleep quality 7. Avoid sugar, avoid sweets as there is a strong link between excessive sugar intake, diabetes, and cognitive impairment We discussed  the Mediterranean diet, which has been shown to help patients reduce the risk of progressive memory disorders and reduces cardiovascular risk. This includes eating fish, eat fruits and green leafy vegetables, nuts like almonds and hazelnuts, walnuts, and also use olive oil. Avoid fast foods and fried foods as much as possible. Avoid sweets and sugar as sugar use has been linked to worsening of memory function.  There is always a concern of gradual progression of memory problems. If this is the case, then we may need to adjust level of care according to patient needs. Support, both to the patient and caregiver, should then be put into place.     FALL PRECAUTIONS: Be cautious when walking. Scan the area for obstacles that may increase the risk of trips and falls. When getting up in the mornings, sit up at the edge of the bed for a few minutes before getting out of bed. Consider elevating the bed at the head end to avoid drop of blood pressure when getting up. Walk always in a well-lit room (use night lights in the walls). Avoid area rugs or power cords from appliances in the middle of the walkways. Use a walker or a cane if necessary and consider physical therapy for balance exercise. Get your eyesight checked regularly.  FINANCIAL OVERSIGHT: Supervision, especially oversight when making financial decisions or transactions is also recommended.  HOME SAFETY: Consider the safety of the kitchen when operating appliances like stoves, microwave oven, and blender. Consider having supervision and share cooking responsibilities until no longer able to participate in those. Accidents with firearms and other hazards in the house should be identified and addressed as  well.   ABILITY TO BE LEFT ALONE: If patient is unable to contact 911 operator, consider using LifeLine, or when the need is there, arrange for someone to stay with patients. Smoking is a fire hazard, consider supervision or cessation. Risk of wandering  should be assessed by caregiver and if detected at any point, supervision and safe proof recommendations should be instituted.  MEDICATION SUPERVISION: Inability to self-administer medication needs to be constantly addressed. Implement a mechanism to ensure safe administration of the medications.   DRIVING: Regarding driving, in patients with progressive memory problems, driving will be impaired. We advise to have someone else do the driving if trouble finding directions or if minor accidents are reported. Independent driving assessment is available to determine safety of driving.   If you are interested in the driving assessment, you can contact the following:  [employer] in [place]  [place] [phone] or [phone]      [place] refers to food and lifestyle choices that are based on the traditions of countries located on the [employer]. This way of eating has been shown to help prevent certain conditions and improve outcomes for people who have chronic diseases, like kidney disease and heart disease. What are tips for following this plan? Lifestyle  Cook and eat meals together with your family, when possible. Drink enough fluid to keep your urine clear or pale yellow. Be physically active every day. This includes: Aerobic exercise like running or swimming. Leisure activities like gardening, walking, or housework. Get 7-8 hours of sleep each night. If recommended by your health care provider, drink red wine in moderation. This means 1 glass a day for nonpregnant women and 2 glasses a day for men. A glass of wine equals 5 oz (150 mL). Reading food labels  Check the serving size of packaged foods. For foods such as rice and pasta, the serving size refers to the amount of cooked product, not dry. Check the total fat in  packaged foods. Avoid foods that have saturated fat or trans fats. Check the ingredients list for added sugars, such as corn syrup. Shopping  At the grocery store, buy most of your food from the areas near the walls of the store. This includes: Fresh fruits and vegetables (produce). Grains, beans, nuts, and seeds. Some of these may be available in unpackaged forms or large amounts (in bulk). Fresh seafood. Poultry and eggs. Low-fat dairy products. Buy whole ingredients instead of prepackaged foods. Buy fresh fruits and vegetables in-season from local farmers markets. Buy frozen fruits and vegetables in resealable bags. If you do not have access to quality fresh seafood, buy precooked frozen shrimp or canned fish, such as tuna, salmon, or sardines. Buy small amounts of raw or cooked vegetables, salads, or olives from the deli or salad bar at your store. Stock your pantry so you always have certain foods on hand, such as olive oil, canned tuna, canned tomatoes, rice, pasta, and beans. Cooking  Cook foods with extra-virgin olive oil instead of using butter or other vegetable oils. Have meat as a side dish, and have vegetables or grains as your main dish. This means having meat in small portions or adding small amounts of meat to foods like pasta or stew. Use beans or vegetables instead of meat in common dishes like chili or lasagna. Experiment with different cooking methods. Try roasting or broiling vegetables instead of steaming or  sauteing them. Add frozen vegetables to soups, stews, pasta, or rice. Add nuts or seeds for added healthy fat at each meal. You can add these to yogurt, salads, or vegetable dishes. Marinate fish or vegetables using olive oil, lemon juice, garlic, and fresh herbs. Meal planning  Plan to eat 1 vegetarian meal one day each week. Try to work up to 2 vegetarian meals, if possible. Eat seafood 2 or more times a week. Have healthy snacks readily available, such  as: Vegetable sticks with hummus. Greek yogurt. Fruit and nut trail mix. Eat balanced meals throughout the week. This includes: Fruit: 2-3 servings a day Vegetables: 4-5 servings a day Low-fat dairy: 2 servings a day Fish, poultry, or lean meat: 1 serving a day Beans and legumes: 2 or more servings a week Nuts and seeds: 1-2 servings a day Whole grains: 6-8 servings a day Extra-virgin olive oil: 3-4 servings a day Limit red meat and sweets to only a few servings a month What are my food choices? Mediterranean diet Recommended Grains: Whole-grain pasta. Brown rice. Bulgar wheat. Polenta. Couscous. Whole-wheat bread. [place]. Vegetables: Artichokes. Beets. Broccoli. Cabbage. Carrots. Eggplant. Green beans. Chard. Kale. Spinach. Onions. Leeks. Peas. Squash. Tomatoes. Peppers. Radishes. Fruits: Apples. Apricots. Avocado. Berries. Bananas. Cherries. Dates. Figs. Grapes. Lemons. Melon. Oranges. Peaches. Plums. Pomegranate. Meats and other protein foods: Beans. Almonds. Sunflower seeds. Pine nuts. Peanuts. Bonnie. Salmon. Scallops. Shrimp. [place]. Tilapia. Clams. Oysters. Eggs. Dairy: Low-fat milk. Cheese. Greek yogurt. Beverages: Water. Red wine. Herbal tea. Fats and oils: Extra virgin olive oil. Avocado oil. Grape seed oil. Sweets and desserts: [place] yogurt with honey. Baked apples. Poached pears. Trail mix. Seasoning and other foods: Basil. Cilantro. Coriander. Cumin. Mint. Parsley. Sage. Rosemary. Tarragon. Garlic. Oregano. Thyme. Pepper. Balsalmic vinegar. Tahini. Hummus. Tomato sauce. Olives. Mushrooms. Limit these Grains: Prepackaged pasta or rice dishes. Prepackaged cereal with added sugar. Vegetables: Deep fried potatoes (french fries). Fruits: Fruit canned in syrup. Meats and other protein foods: Beef. Pork. Lamb. Poultry with skin. Hot dogs. Berniece Salines. Dairy: Ice cream. Sour cream. Whole milk. Beverages: Juice. Sugar-sweetened soft drinks. Beer. Liquor and spirits. Fats and oils:  Butter. [place] oil. Vegetable oil. Beef fat (tallow). Lard. Sweets and desserts: Cookies. Cakes. Pies. Candy. Seasoning and other foods: Mayonnaise. Premade sauces and marinades. The items listed may not be a complete list. Talk with your dietitian about what dietary choices are right for you. Summary The Mediterranean diet includes both food and lifestyle choices. Eat a variety of fresh fruits and vegetables, beans, nuts, seeds, and whole grains. Limit the amount of red meat and sweets that you eat. Talk with your health care provider about whether it is safe for you to drink red wine in moderation. This means 1 glass a day for nonpregnant women and 2 glasses a day for men. A glass of wine equals 5 oz (150 mL). This information is not intended to replace advice given to you by your health care provider. Make sure you discuss any questions you have with your health care provider. Document Released: 03/31/2016 Document Revised: 05/03/2016 Document Reviewed: 03/31/2016 Elsevier Interactive Patient Education  2017 [employer].

## 2022-08-12 NOTE — Progress Notes (Signed)
Assessment/Plan:   Debra Jordan is a very pleasant 71 y.o. year old RH female with ADD, mild dyslexia, arthritis, gout, CKD status post left nephrectomy, fibromyalgia with chronic pain, PCOS, hypothyroidism, history of migraines, history of seizures***a history of hypertension, hyperlipidemia, DM2 with diabetic neuropathy, anxiety, depression seen today for evaluation of memory loss.  She was priorly diagnosed by [facility] with mild cognitive impairment as of 2019.  She was followed up until September 2022 there.***Last MMSE at [place] was 29/30.  She reports interest in second opinion here.  MoCA today is     Memory Complaints  MRI brain without contrast to assess for underlying structural abnormality and assess vascular load  Neurocognitive testing to further evaluate cognitive concerns and determine other underlying cause of memory changes, including potential contribution from sleep, anxiety, or depression  Check B12 Folllow up in 1 month Recommend evaluation by psychiatry and psychology for depression and ADHD  Subjective:   The patient is here alone. Prior records reviewed.    How long did patient have memory difficulties?  For about 6 years.  Patient has been initially seen by [facility] in 2019, with memory and personality change.  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  In 2019, she was having difficulty learning new tasks, and was terminated because she was having trouble adapting to the new processes.  This was accompanied by increasing crying spells aggravated by aggressive behaviors, which she reported being out of character for her.  She was last seen at [facility] on 04/27/2021 with same complaints, accompanied by more near syncope and syncopal episodes as well as increasing stress.  At the time, her MMSE was 29, and MRI was remarkable for moderate perisylvian and mesial  temporal atrophy, mild scattered periventricular and subcortical foci of nonspecific gliosis, and subtle likely plaque, possibly atherosclerotic at the left vertebral artery.  She was diagnosed with mild cognitive impairment. *** entire days . I dont remember gch and ggch names, forgt to tke med bc dont remember what day is, planning dr appt, if not writing them don .  Lose stuff,  ADD dont help, but easil distracted Dtr is frustrated and she is through w the concersation. Has never been formally dx w ADD. " The brain never shuts off".   My brain shuts off at 73 suicid age 15, no thought anymore   repeats oneself?  Endorsed All the time, worse since Covid July 2020 and July 2021 .  Disoriented when walking into a room?  Patient denies except occasionally not remembering what patient came to the room for   Leaving objects in unusual places?  Endorsed.   Wandering behavior?  Patient denies   Any personality changes since last visit?  Patient denies   Any worsening depression?:  Patient denies   Hallucinations or paranoia? I doand I dont. I would see my mom after her death, but I knew, I felt I was seeing her. 10 yrs . My dtr yelling for meis it me or her yelling Idk 3 y  Seizures?   Patient denies    Any sleep changes?  80 percent of the day they are some days, and still can sleep at night. I rarely drea vivid dreams, REM behavior or sleepwalking   Sleep apnea?  Patient denies   Any hygiene concerns? Forget taking showers, longest was 2 weeks  and did not realized  Independent of bathing and dressing?  Endorsed  Does the patient needs help with medications? is in charge *** Who is in charge of the finances?  Patient  is in charge .  *** Any changes in appetite? Endorsed, I crave Ice Cream every day x 2 y  ***   Patient have trouble swallowing? All the time, for the last 6 y and had studies, uknown etiology, I have narrow esohagus  Does the patient cook?  Any kitchen accidents such as leaving  the stove on? Patient denies, but I use the timer all the time.  Any headaches?  Patient denies   Chronic back pain Patient denies  Endorsed, severe lower back, especially when bending. Went to a chiro and accupunture, which helped.  Ambulates with difficulty?   Endorsed, no more of a few minbecause of the chronic pain due to arthritis and possible for FM especially cokda nd rainy days  Recent falls or head injuries? 3 mo ago concussion, another mech fall with concussion and LOC 1 y ago. I have socks and I dont . Needles Started gabapentin 3 times a  Unilateral weakness, numbness or tingling?  She has chronic neuropathy, in the feet and the legs. 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  Any tremors?  Endorsed, can be fine and some days can do when I am trying to carry something especially on the L hand  Any anosmia?  Patient denies   Any incontinence of urine? Endorsed, since 1975  Any bowel dysfunction?   Patient denies     , worse since Covid  Patient lives  ***daughter History of heavy alcohol intake?  Patient denies   History of heavy tobacco use?  Patient denies   Family history of dementia?  Patient denies intermarried so all oth e cousins had bementia  (32 ), 2 aunts AD ad Fa side w AD. No PD in the family .  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 o Pertinent labs October 2023 total cholesterol 217, c-Met normal A1c 5.7, MCV 79.9 Past Medical History:  Diagnosis Date   ADD (attention deficit disorder)    Arthritis    gout   Blue toe syndrome ([place]) 10/06/2011   Broken shoulder    And tailbone    Bursitis    Hip and Shoulder per patient   Cancer ([place])    Skin Cancer   Cataract    Mixed form OU   Chronic kidney disease    hx of left nephrectomy   Diabetes mellitus    type 2    Diabetic retinopathy ([place])    NPDR OU   DM w/o complication type II, uncontrolled 09/06/2011   Dyspnea    Since having COVID   Fatty liver    Fibromyalgia    Gout 09/06/2011   History of headache    History of kidney stones    History of nephrectomy, unilateral 09/06/2011   History of PCOS    History of venous thrombosis and embolism    Left great toe   Hyperlipidemia    Hypersomnolence    Hypertension    Hypertensive retinopathy    OU   Hypothyroid 09/06/2011   Hypothyroidism    Ischemia of extremity 09/06/2011  Migraine headache 09/06/2011   Neuromuscular disorder (HCC)    neuropathy to feet bilateral   Obesity    Pneumonia    Seizure disorder ([place])    Trouble swallowing      Past Surgical History:  Procedure Laterality Date   ABDOMINAL HYSTERECTOMY     CHOLECYSTECTOMY N/A 10/22/2021   Procedure: LAPAROSCOPIC CHOLECYSTECTOMY;  Surgeon: Dwan Bolt, MD;  Location: [facility];  Service: General;  Laterality: N/A;   HERNIA REPAIR     LYMPH NODE BIOPSY     NASAL SINUS SURGERY     NEPHRECTOMY Left 2002   left nephrectomy   TONSILLECTOMY  1969   TUBAL LIGATION       Allergies  Allergen Reactions   Statins Other (See Comments)    Severe myalgia and abdominal cramps   Norvasc [Amlodipine]     Difficulty breathing    Sulfa Antibiotics Nausea Only   Latex Rash    Occasional SOB   Penicillins Rash    angioedema    Current Outpatient Medications  Medication Instructions   cyclobenzaprine (FLEXERIL) 10 mg, Oral, Daily at bedtime   DULoxetine (CYMBALTA) 30 mg, Oral, 2 times daily   HumaLOG KwikPen 10-25 Units, Subcutaneous, 3 times daily with meals PRN   HYDROcodone-acetaminophen (NORCO/VICODIN) 5-325 MG tablet 1 tablet, Oral, Every 6 hours PRN   labetalol (NORMODYNE) 100 mg, Oral, 2 times daily   levothyroxine (SYNTHROID) 112 mcg, Oral, Daily at bedtime   losartan (COZAAR) 50 mg, Oral, 2 times daily   naproxen sodium (ALEVE) 440 mg, Oral, 2 times daily PRN    Toujeo Max SoloStar 54 Units, Subcutaneous, 2 times daily     VITALS:  There were no vitals filed for this visit.     No data to display          PHYSICAL EXAM   HEENT:  Normocephalic, atraumatic. The mucous membranes are moist. The superficial temporal arteries are without ropiness or tenderness. Cardiovascular: Regular rate and rhythm. Lungs: Clear to auscultation bilaterally. Neck: There are no carotid bruits noted bilaterally.  NEUROLOGICAL:     No data to display             04/27/2021   12:54 PM 12/16/2019    1:19 PM 10/02/2018    3:47 PM  MMSE - Mini Mental State Exam  Orientation to time _0 Orientation to Place _1 Registration _2 Attention/ Calculation _3 Recall _4 Language- name 2 objects _5 Language- repeat 1 1 0  Language- follow 3 step command _6 Language- read & follow direction _7 Write a sentence _8 Copy design _9 Total score _10 Orientation:  Alert and oriented to person, place and time. No aphasia or dysarthria. Fund of knowledge is appropriate. Recent memory impaired and remote memory intact.  Attention and concentration are normal.  Able to name objects and repeat phrases. Delayed recall   / Cranial nerves: There is good facial symmetry. Extraocular muscles are intact and visual fields are full to confrontational testing. Speech is fluent and clear. No tongue deviation. Hearing is intact to conversational tone. Tone: Tone is good throughout. Sensation: Sensation is intact to light touch and pinprick throughout. Vibration is intact at the bilateral big toe.There is no extinction with double simultaneous stimulation. There is no sensory dermatomal level identified.  Coordination: The patient has no difficulty with RAM's or FNF bilaterally. Normal finger to nose  Motor: Strength is 5/5 in the bilateral upper and lower extremities. There is no pronator drift. There are no fasciculations noted. DTR's: Deep  tendon reflexes are 2/4 at the bilateral biceps, triceps, brachioradialis, patella and achilles.  Plantar responses are downgoing bilaterally. Gait and Station: The patient is able to ambulate without difficulty.The patient is able to heel toe walk without any difficulty.The patient is able to ambulate in a tandem fashion. The patient is able to stand in the Romberg position.     Thank you for allowing [place] the opportunity to participate in the care of this nice patient. Please do not hesitate to contact us for any questions or concerns.   Total time spent on today's visit was *** minutes dedicated to this patient today, preparing to see patient, examining the patient, ordering tests and/or medications and counseling the patient, documenting clinical information in the EHR or other health record, independently interpreting results and communicating results to the patient/family, discussing treatment and goals, answering patient's questions and coordinating care.  Cc:  Shon Baton, MD  Sharene Butters 08/12/2022 6:33 AM

## 2022-08-13 LAB — VITAMIN B12: Vitamin B-12: 502 pg/mL (ref 211–911)

## 2022-08-14 NOTE — Progress Notes (Signed)
B12 is normal, thanks

## 2022-08-15 DIAGNOSIS — R413 Other amnesia: Secondary | ICD-10-CM | POA: Insufficient documentation

## 2022-08-16 ENCOUNTER — Other Ambulatory Visit: Payer: Medicare Other

## 2022-08-18 ENCOUNTER — Ambulatory Visit: Payer: Medicare Other | Admitting: Neurology

## 2022-08-18 DIAGNOSIS — R404 Transient alteration of awareness: Secondary | ICD-10-CM

## 2022-08-23 NOTE — Progress Notes (Signed)
The 1 h EEG is normal, no seizures noted, thanks

## 2022-08-23 NOTE — Procedures (Signed)
ELECTROENCEPHALOGRAM REPORT  Date of Study: 08/18/2022  Patient's Name: Debra Jordan MRN: [id number] Date of Birth: 01/18/1951  Referring Provider: Sharene Butters, PA-C  Clinical History: This is a 72 year old woman with remote history of seizures with episodes of "gray out" where she may slam into a wall. EEG for classification.  Medications: Flexeril, Cymbalta, Cozaar, Insulin, Synthroid, labetalol  Technical Summary: A multichannel digital 1-hour EEG recording measured by the international 10-20 system with electrodes applied with paste and impedances below 5000 ohms performed in our laboratory with EKG monitoring in an awake and asleep patient.  Hyperventilation was not performed. Photic stimulation was performed.  The digital EEG was referentially recorded, reformatted, and digitally filtered in a variety of bipolar and referential montages for optimal display.    Description: The patient is awake and asleep during the recording.  During maximal wakefulness, there is a symmetric, medium voltage 9 Hz posterior dominant rhythm that attenuates with eye opening.  The record is symmetric.  During drowsiness and sleep, there is an increase in theta slowing of the background, with shifting asymmetry over the bilateral temporal regions.  Vertex waves and symmetric sleep spindles were seen. Photic stimulation did not elicit any abnormalities.  There were no epileptiform discharges or electrographic seizures seen.    EKG lead was unremarkable.  Impression: This 1-hour awake and asleep EEG is within normal limits.  Clinical Correlation: A normal EEG does not exclude a clinical diagnosis of epilepsy.  If further clinical questions remain, prolonged EEG may be helpful.  Clinical correlation is advised.   Ellouise Newer, M.D.

## 2022-08-24 NOTE — Progress Notes (Signed)
No answer at 10:36am 08/24/2022

## 2022-09-09 ENCOUNTER — Ambulatory Visit
Admission: RE | Admit: 2022-09-09 | Discharge: 2022-09-09 | Disposition: A | Discharge status: Home / Self Care | Payer: Medicare Other | Source: Ambulatory Visit | Attending: Physician Assistant | Admitting: Physician Assistant

## 2022-09-12 ENCOUNTER — Ambulatory Visit: Payer: Medicare Other | Admitting: Physician Assistant

## 2022-09-13 NOTE — Progress Notes (Signed)
Mri brain shows some vascular changes, all chronic, nothing is acute, including a chronic stroke, some wear and tear of the vessels that feed the brain , mild atrophy , all these chances may contribute to some extent to memory.  See you at the scheduled appt thanks

## 2022-09-20 ENCOUNTER — Other Ambulatory Visit: Payer: Self-pay | Admitting: Surgery

## 2022-09-20 DIAGNOSIS — K862 Cyst of pancreas: Secondary | ICD-10-CM

## 2022-10-01 ENCOUNTER — Ambulatory Visit
Admission: RE | Admit: 2022-10-01 | Discharge: 2022-10-01 | Disposition: A | Discharge status: Home / Self Care | Payer: Medicare Other | Source: Ambulatory Visit | Attending: Surgery | Admitting: Surgery

## 2022-10-01 DIAGNOSIS — K862 Cyst of pancreas: Secondary | ICD-10-CM

## 2022-10-01 MED ORDER — GADOPICLENOL 0.5 MMOL/ML IV SOLN
10.0000 mL | Freq: Once | INTRAVENOUS | Status: AC | PRN
  Administered 2022-10-01: 10 mL via INTRAVENOUS

## 2022-10-01 MED ORDER — GADOPICLENOL 0.5 MMOL/ML IV SOLN: 10.0000 mL | Freq: Once | INTRAVENOUS | Status: DC | PRN

## 2022-10-12 ENCOUNTER — Encounter: Payer: Self-pay | Admitting: Physician Assistant

## 2022-10-12 ENCOUNTER — Other Ambulatory Visit: Payer: Medicare Other

## 2022-10-12 ENCOUNTER — Ambulatory Visit: Payer: Medicare Other | Admitting: Physician Assistant

## 2022-10-12 VITALS — BP 185/92 | HR 82 | Resp 18 | Wt 241.0 lb

## 2022-10-12 DIAGNOSIS — Z820 Family history of epilepsy and other diseases of the nervous system: Secondary | ICD-10-CM

## 2022-10-12 DIAGNOSIS — F32A Depression, unspecified: Secondary | ICD-10-CM

## 2022-10-12 DIAGNOSIS — R930 Abnormal findings on diagnostic imaging of skull and head, not elsewhere classified: Secondary | ICD-10-CM

## 2022-10-12 DIAGNOSIS — R413 Other amnesia: Secondary | ICD-10-CM | POA: Diagnosis not present

## 2022-10-12 HISTORY — DX: Family history of epilepsy and other diseases of the nervous system: Z82.0

## 2022-10-12 MED ORDER — DONEPEZIL HCL 5 MG PO TABS: 5.0000 mg | ORAL_TABLET | Freq: Every day | ORAL | 11 refills | Status: DC

## 2022-10-12 NOTE — Patient Instructions (Addendum)
It was a pleasure to see you today at our office.   Recommendations:  Follow up in 3-4 months  Neurocognitive testing Check labs today  Referral to Behavioral health  LP at [facility] imaging    We will start donepezil 5 mg daily.     Whom to call:  Memory  decline, memory medications: Call our office [phone]   For psychiatric meds, mood meds: Please have your primary care physician manage these medications.     For assessment of decision of mental capacity and competency:  Call Dr. Anthoney Harada, geriatric psychiatrist at [phone]     If you have any severe symptoms of a stroke, or other severe issues such as confusion,severe chills or fever, etc call 911 or go to the ER as you may need to be evaluated further    RECOMMENDATIONS FOR ALL PATIENTS WITH MEMORY PROBLEMS: 1. Continue to exercise (Recommend 30 minutes of walking everyday, or 3 hours every week) 2. Increase social interactions - continue going to [place] and enjoy social gatherings with friends and family 3. Eat healthy, avoid fried foods and eat more fruits and vegetables 4. Maintain adequate blood pressure, blood sugar, and blood cholesterol level. Reducing the risk of stroke and cardiovascular disease also helps promoting better memory. 5. Avoid stressful situations. Live a simple life and avoid aggravations. Organize your time and prepare for the next day in anticipation. 6. Sleep well, avoid any interruptions of sleep and avoid any distractions in the bedroom that may interfere with adequate sleep quality 7. Avoid sugar, avoid sweets as there is a strong link between excessive sugar intake, diabetes, and cognitive impairment We discussed the Mediterranean diet, which has been shown to help patients reduce the risk of progressive memory disorders and reduces cardiovascular risk. This includes eating fish, eat fruits and green leafy vegetables, nuts like almonds and hazelnuts, walnuts, and also use olive  oil. Avoid fast foods and fried foods as much as possible. Avoid sweets and sugar as sugar use has been linked to worsening of memory function.  There is always a concern of gradual progression of memory problems. If this is the case, then we may need to adjust level of care according to patient needs. Support, both to the patient and caregiver, should then be put into place.     FALL PRECAUTIONS: Be cautious when walking. Scan the area for obstacles that may increase the risk of trips and falls. When getting up in the mornings, sit up at the edge of the bed for a few minutes before getting out of bed. Consider elevating the bed at the head end to avoid drop of blood pressure when getting up. Walk always in a well-lit room (use night lights in the walls). Avoid area rugs or power cords from appliances in the middle of the walkways. Use a walker or a cane if necessary and consider physical therapy for balance exercise. Get your eyesight checked regularly.  FINANCIAL OVERSIGHT: Supervision, especially oversight when making financial decisions or transactions is also recommended.  HOME SAFETY: Consider the safety of the kitchen when operating appliances like stoves, microwave oven, and blender. Consider having supervision and share cooking responsibilities until no longer able to participate in those. Accidents with firearms and other hazards in the house should be identified and addressed as well.   ABILITY TO BE LEFT ALONE: If patient is unable to contact 911 operator, consider using LifeLine, or when the need is there, arrange for someone to stay with patients.  Smoking is a fire hazard, consider supervision or cessation. Risk of wandering should be assessed by caregiver and if detected at any point, supervision and safe proof recommendations should be instituted.  MEDICATION SUPERVISION: Inability to self-administer medication needs to be constantly addressed. Implement a mechanism to ensure safe  administration of the medications.   DRIVING: Regarding driving, in patients with progressive memory problems, driving will be impaired. We advise to have someone else do the driving if trouble finding directions or if minor accidents are reported. Independent driving assessment is available to determine safety of driving.   If you are interested in the driving assessment, you can contact the following:  [employer] in [place]  [place] [phone] or [phone]      [place] refers to food and lifestyle choices that are based on the traditions of countries located on the [employer]. This way of eating has been shown to help prevent certain conditions and improve outcomes for people who have chronic diseases, like kidney disease and heart disease. What are tips for following this plan? Lifestyle  Cook and eat meals together with your family, when possible. Drink enough fluid to keep your urine clear or pale yellow. Be physically active every day. This includes: Aerobic exercise like running or swimming. Leisure activities like gardening, walking, or housework. Get 7-8 hours of sleep each night. If recommended by your health care provider, drink red wine in moderation. This means 1 glass a day for nonpregnant women and 2 glasses a day for men. A glass of wine equals 5 oz (150 mL). Reading food labels  Check the serving size of packaged foods. For foods such as rice and pasta, the serving size refers to the amount of cooked product, not dry. Check the total fat in packaged foods. Avoid foods that have saturated fat or trans fats. Check the ingredients list for added sugars, such as corn syrup. Shopping  At the grocery store, buy most of your food from the areas near the walls of the store. This includes: Fresh fruits and  vegetables (produce). Grains, beans, nuts, and seeds. Some of these may be available in unpackaged forms or large amounts (in bulk). Fresh seafood. Poultry and eggs. Low-fat dairy products. Buy whole ingredients instead of prepackaged foods. Buy fresh fruits and vegetables in-season from local farmers markets. Buy frozen fruits and vegetables in resealable bags. If you do not have access to quality fresh seafood, buy precooked frozen shrimp or canned fish, such as tuna, salmon, or sardines. Buy small amounts of raw or cooked vegetables, salads, or olives from the deli or salad bar at your store. Stock your pantry so you always have certain foods on hand, such as olive oil, canned tuna, canned tomatoes, rice, pasta, and beans. Cooking  Cook foods with extra-virgin olive oil instead of using butter or other vegetable oils. Have meat as a side dish, and have vegetables or grains as your main dish. This means having meat in small portions or adding small amounts of meat to foods like pasta or stew. Use beans or vegetables instead of meat in common dishes like chili or lasagna. Experiment with different cooking methods. Try roasting or broiling vegetables instead of steaming or sauteing them. Add frozen vegetables to soups, stews, pasta, or rice. Add nuts or seeds for added healthy fat at each meal. You can add these to yogurt, salads, or vegetable dishes.  Marinate fish or vegetables using olive oil, lemon juice, garlic, and fresh herbs. Meal planning  Plan to eat 1 vegetarian meal one day each week. Try to work up to 2 vegetarian meals, if possible. Eat seafood 2 or more times a week. Have healthy snacks readily available, such as: Vegetable sticks with hummus. Greek yogurt. Fruit and nut trail mix. Eat balanced meals throughout the week. This includes: Fruit: 2-3 servings a day Vegetables: 4-5 servings a day Low-fat dairy: 2 servings a day Fish, poultry, or lean meat: 1 serving a  day Beans and legumes: 2 or more servings a week Nuts and seeds: 1-2 servings a day Whole grains: 6-8 servings a day Extra-virgin olive oil: 3-4 servings a day Limit red meat and sweets to only a few servings a month What are my food choices? Mediterranean diet Recommended Grains: Whole-grain pasta. Brown rice. Bulgar wheat. Polenta. Couscous. Whole-wheat bread. [place]. Vegetables: Artichokes. Beets. Broccoli. Cabbage. Carrots. Eggplant. Green beans. Chard. Kale. Spinach. Onions. Leeks. Peas. Squash. Tomatoes. Peppers. Radishes. Fruits: Apples. Apricots. Avocado. Berries. Bananas. Cherries. Dates. Figs. Grapes. Lemons. Melon. Oranges. Peaches. Plums. Pomegranate. Meats and other protein foods: Beans. Almonds. Sunflower seeds. Pine nuts. Peanuts. [place]. Salmon. Scallops. Shrimp. [place]. Tilapia. Clams. Oysters. Eggs. Dairy: Low-fat milk. Cheese. Greek yogurt. Beverages: Water. Red wine. Herbal tea. Fats and oils: Extra virgin olive oil. Avocado oil. Grape seed oil. Sweets and desserts: [place] yogurt with honey. Baked apples. Poached pears. Trail mix. Seasoning and other foods: Basil. Cilantro. Coriander. Cumin. Mint. Parsley. Sage. Rosemary. Tarragon. Garlic. Oregano. Thyme. Pepper. Balsalmic vinegar. Tahini. Hummus. Tomato sauce. Olives. Mushrooms. Limit these Grains: Prepackaged pasta or rice dishes. Prepackaged cereal with added sugar. Vegetables: Deep fried potatoes (french fries). Fruits: Fruit canned in syrup. Meats and other protein foods: Beef. Pork. Lamb. Poultry with skin. Hot dogs. Bernie[place] Salines. Dairy: Ice cream. Sour cream. Whole milk. Beverages: Juice. Sugar-sweetened soft drinks. Beer. Liquor and spirits. Fats and oils: Butter. Canola oil. Vegetable oil. Beef fat (tallow). Lard. Sweets and desserts: Cookies. Cakes. Pies. Candy. Seasoning and other foods: Mayonnaise. Premade sauces and marinades. The items listed may not be a complete list. Talk with your dietitian about what  dietary choices are right for you. Summary The Mediterranean diet includes both food and lifestyle choices. Eat a variety of fresh fruits and vegetables, beans, nuts, seeds, and whole grains. Limit the amount of red meat and sweets that you eat. Talk with your health care provider about whether it is safe for you to drink red wine in moderation. This means 1 glass a day for nonpregnant women and 2 glasses a day for men. A glass of wine equals 5 oz (150 mL). This information is not intended to replace advice given to you by your health care provider. Make sure you discuss any questions you have with your health care provider. Document Released: 03/31/2016 Document Revised: 05/03/2016 Document Reviewed: 03/31/2016 Elsevier Interactive Patient Education  2017 [employer].

## 2022-10-12 NOTE — Progress Notes (Signed)
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 Strong family history of Alzheimer's disease  Debra Jordan is a very pleasant 72 y.o. RH female with a history of possible ADD, mild dyslexia, arthritis, gout, CKD status post left nephrectomy, fibromyalgia with chronic pain, PCOS, hypothyroidism,  history of seizures in the past, history of hypertension, hyperlipidemia, DM2 with diabetic neuropathy, anxiety, depression and strong family history of dementia seen today in follow up to discuss the MRI of the brain results. She was priorly diagnosed by [facility] with mild cognitive impairment as of 2019 followed up until September 2022 there. Last MMSE at [place] was 29/30. During her visit for a second opinion on 08/12/22 her MoCA was 29/30.  MRI of the brain was personally reviewed, remarkable for a chronic lacunar infarct within the left periatrial white matter, new from the prior brain MRI of 04/28/2018, mild chronic small vessel ischemic disease within the cerebral white matter and pons, progressed from the prior MRI, mild generalized cerebral atrophy. EEG was normal without seizure activity.  Patient is not on antidementia medication.  She still able to participate in her ADLs without difficulty.  Recommendations   Follow up in 3 months. Patient is scheduled for Neuropsych testing for clarity of diagnosis and disease trajectory for 06/2023, will try to move up this appointment, to a closer one given the recent MRI of the brain findings and her strong family history of Alzheimer's disease, hopefully after LP results become available. Start Donepezil 79m  daily. Side effects discussed   Will schedule LP for AD diagnosis given her strong family history of AD and recent MRI findings. Check APOe   Referral to behavioral health for management of depression and possible ADD/ADHD  Continue to control mood as per PCP Recommend good control of cardiovascular risk  factors     Subjective:    This patient is here alone.  Previous records as well as any outside records available were reviewed prior to todays visit.    Any changes in memory since last visit?  "About the same, coming and going, especially with dates otherwise no much change ".   Initial visit 08/12/22   How long did patient have memory difficulties?  For about 6 years.  Patient has been initially seen by [facility] in 2019, with memory and personality change.  She does report a history of dyslexia, and she noted at the time, that was having some problems doing tasks that previously was familiar and capable of as an aOptometrist  In 2019, she was having difficulty learning new tasks, and was terminated because she was having trouble adapting to the new processes.  This was accompanied by increasing crying spells aggravated by aggressive behaviors, which she reported being out of character for her.  [facility] same complaints, accompanied by more near syncope and syncopal episodes as well as increasing stress.  At the time, her MMSE was 29.  However she reports that there are some days that she does not remember entire day, or the names of her grandchildren great-grandchildren.  At times, she does not remember if she took the medication (for example today she forgot to take her blood pressure medication), and she also has difficulty planning, forgetting doctors appointments if she does not write them down.  She does admit to losing staff.  "ADD do not help, but I am easily distracted the brain never shuts of ".  According to her, "  my daughter is frustrated, having difficulty going through with the conversation " repeats oneself?  Endorsed "all the time, worse since Covid July 2020 and July 2021 ".  Disoriented when walking into a room?  Patient denies except occasionally not remembering what patient came to the room for   Leaving objects in unusual places?  Endorsed "I lose  objects easy ".   Wandering behavior?  Patient denies   Any personality changes since last visit?  "I have more irritated than before, I can yell and get angry easier " Any worsening depression?:  Patient denies   Hallucinations or paranoia?  "I do not and I do not ".  "I would see my dead mom after her there is although I knew that she was dead, I felt I was seeing her ". Seizures?   Remote,, however she has episodes of "gray out "where she can slam into a wall.  This happened 5 times last about 2-3 months ago. Any sleep changes?  I can sleep 80 percent of the day and still  sleep at night, rarely having dreams, denies REM behavior or sleepwalking. Sleep apnea?  Patient denies   Any hygiene concerns? Forget taking showers, longest was 2 weeks with taking one and did not realized  Independent of bathing and dressing?  Endorsed  Does the patient needs help with medications?  Patient is in charge Who is in charge of the finances?  Patient  is in charge . Any changes in appetite? Endorsed," I crave Ice Cream every day for the last 2 years" Patient have trouble swallowing? All the time, "I have narrow esophagus, I had some studies done over the last 6 years ". Does the patient cook?  Endorsed Any kitchen accidents such as leaving the stove on? Patient denies, but I use the timer all the time.  Any headaches?  Patient denies   Chronic back pain  Endorsed, severe lower back, especially when bending. Went to a Engineer, building services, which helped.  Ambulates with difficulty?   Endorsed, no more than a few minutes, because of the chronic pain due to arthritis, neuropathy and possibly to fibromyalgia, especially on cold days and rainy days. Recent falls or head injuries?  "3 months ago I had a concussion, without loss of consciousness.  She had another mechanical fall with concussion and loss of consciousness about 1 year ago.  "She started gabapentin 3 times a day recently. Unilateral weakness,  numbness or tingling?  She has chronic neuropathy, in the feet and the legs. She thinks she "may have had mini strokes when I had a crying spells and I wound up getting fired because I was calling people names and lashing "  Any tremors?  Endorsed, "I can be fine and some days can do when I am trying to carry something especially on the L hand, is more difficult to " Any anosmia?  Patient denies   Any incontinence of urine? Endorsed, since 1975  Any bowel dysfunction?   worse since Covid  Patient lives she lives with her daughter who has special needs " when I was 2 months pregnant I had meningitis and it affected my daughter ".    History of heavy alcohol intake?  Patient denies   History of heavy tobacco use?  Patient denies   Family history of dementia?  Patient reports that there was intermarriage in her family "so the cousins had dementia (77 of them), she also has 2 aunts form the maternal and two  of the paternal side with Alzheimer's disease   The patient drives?  Endorsed, sometimes she forgets where she is driving, even in known roads, even on the known roads. She is an Optometrist, Estate agent, and she also has a degree in management, all of these degrees obtained after age 41.  Prior to that she had associates degree in accounting from community college.   Pertinent labs October 2023 total cholesterol 217, c-Met normal A1c 5.7, MCV 79.9 CURRENT MEDICATIONS:  Outpatient Encounter Medications as of 10/12/2022  Medication Sig   cyclobenzaprine (FLEXERIL) 10 MG tablet Take 10 mg by mouth at bedtime.   DULoxetine (CYMBALTA) 30 MG capsule Take 30 mg by mouth 2 (two) times daily.   HYDROcodone-acetaminophen (NORCO/VICODIN) 5-325 MG tablet Take 1 tablet by mouth every 6 (six) hours as needed for severe pain.   insulin glargine, 2 Unit Dial, (TOUJEO MAX SOLOSTAR) 300 UNIT/ML Solostar Pen Inject 54 Units into the skin 2 (two) times daily.   insulin lispro (HUMALOG KWIKPEN) 200 UNIT/ML  KwikPen Inject 10-25 Units into the skin 3 (three) times daily with meals as needed (high blood sugar).   labetalol (NORMODYNE) 100 MG tablet Take 100 mg by mouth 2 (two) times daily.   levothyroxine (SYNTHROID) 112 MCG tablet Take 112 mcg by mouth at bedtime.   losartan (COZAAR) 50 MG tablet Take 50 mg by mouth 2 (two) times daily.   naproxen sodium (ALEVE) 220 MG tablet Take 440 mg by mouth 2 (two) times daily as needed (pain).   No facility-administered encounter medications on file as of 10/12/2022.       04/27/2021   12:54 PM 12/16/2019    1:19 PM 10/02/2018    3:47 PM  MMSE - Mini Mental State Exam  Orientation to time 5 4 5  $ Orientation to Place 5 4 4  $ Registration 3 3 3  $ Attention/ Calculation 5 5 4  $ Recall 2 3 3  $ Language- name 2 objects 2 2 2  $ Language- repeat 1 1 0  Language- follow 3 step command 3 3 3  $ Language- read & follow direction 1 1 1  $ Write a sentence 1 1 1  $ Copy design 1 1 1  $ Total score 29 28 27      $ 08/15/2022   10:00 AM  Montreal Cognitive Assessment   Visuospatial/ Executive (0/5) 5  Naming (0/3) 3  Attention: Read list of digits (0/2) 2  Attention: Read list of letters (0/1) 1  Attention: Serial 7 subtraction starting at 100 (0/3) 3  Language: Repeat phrase (0/2) 2  Language : Fluency (0/1) 1  Abstraction (0/2) 2  Delayed Recall (0/5) 4  Orientation (0/6) 6  Total 29  Adjusted Score (based on education) 29   Thank you for allowing [place] the opportunity to participate in the care of this nice patient. Please do not hesitate to contact us for any questions or concerns.   Total time spent on today's visit was 31 minutes dedicated to this patient today, preparing to see patient, examining the patient, ordering tests and/or medications and counseling the patient, documenting clinical information in the EHR or other health record, independently interpreting results and communicating results to the patient/family, discussing treatment and goals, answering  patient's questions and coordinating care.  Cc:  Shon Baton, MD  Sharene Butters 10/12/2022 6:58 AM

## 2022-10-14 ENCOUNTER — Telehealth: Payer: Self-pay

## 2022-10-14 NOTE — Telephone Encounter (Signed)
Advised pending results

## 2022-10-14 NOTE — Telephone Encounter (Signed)
LP is pending, will need to contact [facility] clinic lab

## 2022-10-14 NOTE — Telephone Encounter (Signed)
Debra Jordan is calling in from [facility], states that they are waiting on patients spinal fluid and is wondering if we have completed this and its on its way or if we havent done it yet that we will need to contact her.

## 2022-10-19 ENCOUNTER — Telehealth: Payer: Self-pay | Admitting: Physician Assistant

## 2022-10-19 NOTE — Telephone Encounter (Signed)
Received message from the access nurse from [place] at [facility]. She is calling about a cancelled lab. Lab Reference Number: [id number]

## 2022-10-19 NOTE — Telephone Encounter (Signed)
This is being checked into at Lab [address] at this time

## 2022-10-21 ENCOUNTER — Other Ambulatory Visit: Payer: Medicare Other

## 2022-10-21 DIAGNOSIS — R413 Other amnesia: Secondary | ICD-10-CM

## 2022-10-27 ENCOUNTER — Telehealth: Payer: Self-pay | Admitting: Physician Assistant

## 2022-10-27 NOTE — Telephone Encounter (Signed)
I have completed report, thanks

## 2022-10-27 NOTE — Telephone Encounter (Signed)
[facility]ed [facility] now

## 2022-10-27 NOTE — Telephone Encounter (Signed)
[facility] is calling in stating there has been a mix up with some orders, and misspelling of patients name that they need fixed. They are also waiting on CFS samples to come in.

## 2022-10-27 NOTE — Telephone Encounter (Signed)
[facility] called and left a message with the access nurse. She has a final report. Lab Reference Number: [id number]

## 2022-10-28 ENCOUNTER — Other Ambulatory Visit (HOSPITAL_COMMUNITY)
Admission: RE | Admit: 2022-10-28 | Discharge: 2022-10-28 | Disposition: A | Discharge status: Home / Self Care | Payer: Medicare Other | Source: Ambulatory Visit | Attending: Physician Assistant | Admitting: Physician Assistant

## 2022-10-28 ENCOUNTER — Ambulatory Visit
Admission: RE | Admit: 2022-10-28 | Discharge: 2022-10-28 | Disposition: A | Discharge status: Home / Self Care | Payer: Medicare Other | Source: Ambulatory Visit | Attending: Physician Assistant | Admitting: Physician Assistant

## 2022-10-28 DIAGNOSIS — Z820 Family history of epilepsy and other diseases of the nervous system: Secondary | ICD-10-CM | POA: Insufficient documentation

## 2022-10-28 DIAGNOSIS — R413 Other amnesia: Secondary | ICD-10-CM | POA: Diagnosis present

## 2022-10-28 LAB — TIQ-MISC

## 2022-10-28 NOTE — Progress Notes (Signed)
1 vial of blood drawn from pts LAC to be sent off with LP lab work. 1 successful attempt, pt tolerated well. Gauze and tape applied after.  ?

## 2022-10-28 NOTE — Discharge Instructions (Signed)

## 2022-10-31 LAB — CYTOLOGY - NON PAP

## 2022-11-04 ENCOUNTER — Telehealth: Payer: Self-pay

## 2022-11-04 ENCOUNTER — Encounter: Payer: Self-pay | Admitting: Psychology

## 2022-11-04 DIAGNOSIS — F909 Attention-deficit hyperactivity disorder, unspecified type: Secondary | ICD-10-CM | POA: Insufficient documentation

## 2022-11-04 DIAGNOSIS — Z8616 Personal history of COVID-19: Secondary | ICD-10-CM | POA: Insufficient documentation

## 2022-11-04 DIAGNOSIS — N189 Chronic kidney disease, unspecified: Secondary | ICD-10-CM | POA: Insufficient documentation

## 2022-11-04 DIAGNOSIS — M797 Fibromyalgia: Secondary | ICD-10-CM | POA: Insufficient documentation

## 2022-11-04 DIAGNOSIS — E11319 Type 2 diabetes mellitus with unspecified diabetic retinopathy without macular edema: Secondary | ICD-10-CM | POA: Insufficient documentation

## 2022-11-04 NOTE — Telephone Encounter (Signed)
[facility] is calling in asking for an update if she has been scheduled for a LB Puncture or if the samples have been sent,

## 2022-11-04 NOTE — Telephone Encounter (Signed)
Done on 0/3/03/2023 notified [facility]

## 2022-11-07 ENCOUNTER — Ambulatory Visit: Payer: Medicare Other | Admitting: Psychology

## 2022-11-07 ENCOUNTER — Encounter: Payer: Self-pay | Admitting: Psychology

## 2022-11-07 DIAGNOSIS — F451 Undifferentiated somatoform disorder: Secondary | ICD-10-CM

## 2022-11-07 DIAGNOSIS — Z8616 Personal history of COVID-19: Secondary | ICD-10-CM

## 2022-11-07 DIAGNOSIS — R4189 Other symptoms and signs involving cognitive functions and awareness: Secondary | ICD-10-CM

## 2022-11-07 DIAGNOSIS — M797 Fibromyalgia: Secondary | ICD-10-CM | POA: Diagnosis not present

## 2022-11-07 DIAGNOSIS — I6381 Other cerebral infarction due to occlusion or stenosis of small artery: Secondary | ICD-10-CM

## 2022-11-07 HISTORY — DX: Undifferentiated somatoform disorder: F45.1

## 2022-11-07 HISTORY — DX: Other symptoms and signs involving cognitive functions and awareness: R41.89

## 2022-11-07 NOTE — Progress Notes (Unsigned)
NEUROPSYCHOLOGICAL EVALUATION [place] Department of Neurology  Date of Evaluation: November 07, 2022  Reason for Referral:   Debra Jordan is a 72 y.o. ambidextrous Caucasian female referred by Sharene Butters, PA-C, to characterize her current cognitive functioning and assist with diagnostic clarity and treatment planning in the context of subjective cognitive decline.   Assessment and Plan:   Clinical Impression(s): Debra Jordan pattern of performance is suggestive of neuropsychological functioning largely within normal limits relative to age-matched peers. An isolated weakness was exhibited across basic attention. However, performances across complex attention were appropriate. Another isolated weakness was exhibited across a story-based delay recall task. However, she still retained 79% of previously learned information and consistent performed very well all aspects of three other memory tasks. Performance variability was exhibited across executive functioning. Performances were appropriate across processing speed, receptive and expressive language, and visuospatial abilities. Debra Jordan largely denied significant difficulties completing instrumental activities of daily living (ADLs) independently. I do not believe she meets diagnostic criteria for a neurocognitive disorder at the present time.  ***.   Specific to memory, Debra Jordan was able to learn novel verbal and visual information efficiently and retain this knowledge after lengthy delays. Overall, memory performance combined with intact performances across other areas of cognitive functioning is not suggestive of Alzheimer's disease. Likewise, her cognitive and behavioral profile is not suggestive of any other form of neurodegenerative illness presently.  Recommendations: ***  A repeat neuropsychological evaluation in 12-18 months (or sooner if functional decline is noted) is recommended to assess the  trajectory of future cognitive decline should it occur. This will also aid in future efforts towards improved diagnostic clarity.  Debra Jordan has already been prescribed a medication aimed to address memory loss and concerns surrounding Alzheimer's disease (i.e., ***). she is encouraged to continue taking this medication as prescribed. It is important to highlight that this medication has been shown to slow functional decline in some individuals. There is no current treatment which can stop or reverse cognitive decline when caused by a neurodegenerative illness.   A combination of medication and psychotherapy has been shown to be most effective at treating symptoms of anxiety and depression. As such, Debra Jordan is encouraged to speak with her prescribing physician regarding medication adjustments to optimally manage these symptoms. Likewise, Debra Jordan is encouraged to consider engaging in short-term psychotherapy to address symptoms of psychiatric distress. she would benefit from an active and collaborative therapeutic environment, rather than one purely supportive in nature. Recommended treatment modalities include Cognitive Behavioral Therapy (CBT) or Acceptance and Commitment Therapy (ACT).  Performance across neurocognitive testing is not a strong predictor of an individual's safety operating a motor vehicle. Should her family wish to pursue a formalized driving evaluation, they could reach out to the following agencies: [employer] in [place]: [phone] Driver Rehabilitative Services: [facility]: [place]: [phone] or [phone]  Should there be progression of current deficits over time, Debra Jordan is unlikely to regain any independent living skills lost. Therefore, it is recommended that she remain as involved as possible in all aspects of household chores, finances, and medication management, with supervision to ensure adequate  performance. she will likely benefit from the establishment and maintenance of a routine in order to maximize her functional abilities over time.  It will be important for Debra Jordan to have another person with her when in situations where she may need to process information, weigh the pros  and cons of different options, and make decisions, in order to ensure that she fully understands and recalls all information to be considered.  If not already done, Debra Jordan and her family may want to discuss her wishes regarding durable power of attorney and medical decision making, so that she can have input into these choices. If they require legal assistance with this, long-term care resource access, or other aspects of estate planning, they could reach out to [facility] at [phone] for a free consultation. Additionally, they may wish to discuss future plans for caretaking and seek out community options for in home/residential care should they become necessary.  Debra Jordan is encouraged to attend to lifestyle factors for brain health (e.g., regular physical exercise, good nutrition habits and consideration of the MIND-DASH diet, regular participation in cognitively-stimulating activities, and general stress management techniques), which are likely to have benefits for both emotional adjustment and cognition. In fact, in addition to promoting good general health, regular exercise incorporating aerobic activities (e.g., brisk walking, jogging, cycling, etc.) has been demonstrated to be a very effective treatment for depression and stress, with similar efficacy rates to both antidepressant medication and psychotherapy.   Optimal control of vascular risk factors (including safe cardiovascular exercise and adherence to dietary recommendations) is encouraged. Likewise, continued compliance with her CPAP machine will also be important. ***  Continued participation in activities which provide mental stimulation and  social interaction is also recommended.   If interested, there are some activities which have therapeutic value and can be useful in keeping her cognitively stimulated. For suggestions, Debra Jordan is encouraged to go to the following website: https://www.[facility].org/get-to-know-barrow/centers-programs/neurorehabilitation-center/neuro-rehab-apps-and-games/ which has options, categorized by level of difficulty. It should be noted that these activities should not be viewed as a substitute for therapy.  Important information should be provided to Debra Jordan in written format in all instances. This information should be placed in a highly frequented and easily visible location within her home to promote recall. External strategies such as written notes in a consistently used memory journal, visual and nonverbal auditory cues such as a calendar on the refrigerator or appointments with alarm, such as on a cell phone, can also help maximize recall.  Memory can be improved using internal strategies such as rehearsal, repetition, chunking, mnemonics, association, and imagery. External strategies such as written notes in a consistently used memory journal, visual and nonverbal auditory cues such as a calendar on the refrigerator or appointments with alarm, such as on a cell phone, can also help maximize recall.    When learning new information, she would benefit from information being broken up into small, manageable pieces. she may also find it helpful to articulate the material in her own words and in a context to promote encoding at the onset of a new task. This material may need to be repeated multiple times to promote encoding.  Because she shows better recall for structured information, she will likely understand and retain new information better if it is presented to her in a meaningful or well-organized manner at the outset, such as grouping items into meaningful categories or presenting information in an  outlined, bulleted, or story format.  To address problems with processing speed, she may wish to consider:   -Ensuring that she is alerted when essential material or instructions are being presented   -Adjusting the speed at which new information is presented   -Allowing for more time in comprehending, processing, and responding in conversation   -Repeating  and paraphrasing instructions or conversations aloud  To address problems with fluctuating attention and/or executive dysfunction, she may wish to consider:   -Avoiding external distractions when needing to concentrate   -Limiting exposure to fast paced environments with multiple sensory demands   -Writing down complicated information and using checklists   -Attempting and completing one task at a time (i.e., no multi-tasking)   -Verbalizing aloud each step of a task to maintain focus   -Taking frequent breaks during the completion of steps/tasks to avoid fatigue   -Reducing the amount of information considered at one time   -Scheduling more difficult activities for a time of day where she is usually most alert  {[id number]}  Review of Records:   Debra Jordan was seen by [facility] Sharene Butters, PA-C) on 08/12/2022 for an evaluation of memory loss. She had previously been seen by a different neurology office (MMSE was 29/30) and expressed a desire for a second opinion. At that time, Debra Jordan described difficulties with short-term memory. Examples included times where she will not recall an entire day, trouble recalling if she has taken medications that day, and trouble remembering the names of her grandchildren. There was reported of her being more repetitive in conversation. There is also a history of ADHD per medical records. ADLs were largely described as intact outside of some trouble recalling medications. Performance on a brief cognitive screening instrument (MOCA) was 29/30. Ultimately, Debra Jordan was referred for a  comprehensive neuropsychological evaluation to characterize her cognitive abilities and to assist with diagnostic clarity and treatment planning.   Prior neuroimaging is somewhat contradictory. Brain MRI on 05/11/2006 was unremarkable. Brain MRI on 04/30/2018 revealed moderate perisylvian and mesial temporal atrophy, said to have slightly progressed since 2007. However, brain MRI on 09/09/2022 revealed only mild atrophy which was generalized in nature and without lobar predominance. Mild microvascular ischemic disease was also noted, as well as a lacunar infarction involving the left periatrial white matter.   A lumbar puncture had been performed on 10/28/2022. Unfortunately, these results were not available at the time of the current evaluation.   Past Medical History:  Diagnosis Date   ADHD (attention deficit hyperactivity disorder)    Arthritis    gout   Bilateral shoulder pain 05/13/2022   Blue toe syndrome 10/06/2011   Broken shoulder    And tailbone    Bursitis    Hip and Shoulder per patient   Cataract    Mixed form OU   Chronic fatigue, unspecified 03/03/2019   Chronic kidney disease    hx of left nephrectomy   Diabetic retinopathy    NPDR OU   Dyspnea    Since having COVID   Elevated troponin 09/17/2021   Epigastric pain 09/17/2021   Family history of Alzheimer's disease 10/12/2022   Fatty liver    Fibromyalgia    Gallstones without obstruction of gallbladder 10/04/2021   Gout 09/06/2011   History of COVID-19 03/03/2019   History of headache    History of kidney stones    History of nephrectomy, unilateral 09/06/2011   History of PCOS    History of skin cancer    Skin Cancer   History of venous thrombosis and embolism    Left great toe   HTN (hypertension) 09/06/2011   Hypercalcemia 09/17/2021   Hyperlipidemia    Hypersomnolence    Hypertensive retinopathy    OU   Hypertriglyceridemia 09/06/2011   Hypothyroidism 09/06/2011   Ischemia of extremity 09/06/2011    Lacunar  infarction    left periatrial white matter   Mass of finger of right hand 03/11/2019   Migraine headache 09/06/2011   Neuropathy    feet bilateral   Obesity    Osteoarthritis, hand 08/06/2020   Pain in right hip 08/22/1972   Pain in right shoulder 08/06/2020   Pain in toe of right foot 09/05/2011   Pancreatic cyst 10/04/2021   Pneumonia    Seizure disorder    Trouble swallowing    Type II diabetes mellitus 09/06/2011    Past Surgical History:  Procedure Laterality Date   ABDOMINAL HYSTERECTOMY     CHOLECYSTECTOMY N/A 10/22/2021   Procedure: LAPAROSCOPIC CHOLECYSTECTOMY;  Surgeon: Dwan Bolt, MD;  Location: [place];  Service: General;  Laterality: N/A;   HERNIA REPAIR     LYMPH NODE BIOPSY     NASAL SINUS SURGERY     NEPHRECTOMY Left 2002   left nephrectomy   TONSILLECTOMY  1969   TUBAL LIGATION      Current Outpatient Medications:    cyclobenzaprine (FLEXERIL) 10 MG tablet, Take 10 mg by mouth at bedtime., Disp: , Rfl:    donepezil (ARICEPT) 5 MG tablet, Take 1 tablet (5 mg total) by mouth daily., Disp: 30 tablet, Rfl: 11   DULoxetine (CYMBALTA) 30 MG capsule, Take 30 mg by mouth 2 (two) times daily., Disp: , Rfl:    HYDROcodone-acetaminophen (NORCO/VICODIN) 5-325 MG tablet, Take 1 tablet by mouth every 6 (six) hours as needed for severe pain., Disp: 15 tablet, Rfl: 0   insulin glargine, 2 Unit Dial, (TOUJEO MAX SOLOSTAR) 300 UNIT/ML Solostar Pen, Inject 54 Units into the skin 2 (two) times daily., Disp: , Rfl:    insulin lispro (HUMALOG KWIKPEN) 200 UNIT/ML KwikPen, Inject 10-25 Units into the skin 3 (three) times daily with meals as needed (high blood sugar)., Disp: , Rfl:    labetalol (NORMODYNE) 100 MG tablet, Take 100 mg by mouth 2 (two) times daily., Disp: , Rfl:    levothyroxine (SYNTHROID) 112 MCG tablet, Take 112 mcg by mouth at bedtime., Disp: , Rfl:    losartan (COZAAR) 50 MG tablet, Take 50 mg by mouth 2 (two) times daily., Disp: , Rfl:    naproxen sodium  (ALEVE) 220 MG tablet, Take 440 mg by mouth 2 (two) times daily as needed (pain)., Disp: , Rfl:   Clinical Interview:   The following information was obtained during a clinical interview with Debra Jordan. Jarecki prior to cognitive testing.  Cognitive Symptoms: Decreased short-term memory: Endorsed. She described fairly severe memory dysfunction, largely surrounding her quickly forgetting recent information, trouble recalling names, misplacing objects, entering rooms and forgetting her intention, and being more repetitive in conversation.  Decreased long-term memory: Denied. Decreased attention/concentration: Endorsed. She was diagnosed with ADHD as an adult from what sounded like an appointment aimed at potentially diagnosing her daughter with the same condition. She did describe longstanding attentional concerns surrounding a tendency to daydream or become inattentive in early academic settings. No behavioral disturbances or impulsivity was noted. Difficulties have persisted and become quite severe lately.  Reduced processing speed: Endorsed. Difficulties with executive functions: Endorsed. She reported ongoing trouble with organization and multi-tasking. She also described some personality changes in that she seems to have a shorter fuse and may lash out at others, calling them unkind names. This was said to be very out of character for her.  Difficulties with emotion regulation: Denied. Difficulties with receptive language: Denied. Difficulties with word finding: Endorsed. Decreased visuoperceptual ability: Endorsed.  She noted trouble bumping into the wall or other objects while ambulating.   Trajectory of deficits: Debra Jordan estimated that cognitive decline first occurred when she was approximately 38-31 years old. Per her report, she was at work one day and experienced some disorientation. She commented working her normal routine, only to look around and realize that she was alone and it was 11:00pm. When  driving home that night, she noted going the wrong way on a one-way street. She also described personality changes in that she is easily irritated and has become more argumentative and unkind to customers while working. The cause for this change was unknown. There was consideration of a stroke. Neuroimaging did suggest a lacunar infarct occurring sometime between 2019 and 2024. No other neurological causes were identified. From this time, global decline was reported.   Difficulties completing ADLs: Somewhat. She primary described difficulties with medication management and forgetting to take her medications as prescribed. This difficulty was primary said to be noticeable during the past six months. The majority of bills ar eon auto-draft but she did allude to some trouble with bill paying timeliness. Driving related difficulties were denied outside of abruptly forgetting where certain familiar offices or buildings are located.   Additional Medical History: History of traumatic brain injury/concussion: Endorsed. She reported falling about 1-2 years prior, noting that she "grayed out" and woke up on the floor after an unknown amount of time had passed. She was reportedly seen by a physician and diagnosed with a likely concussion. No other concussion diagnoses were reported.  History of stroke: Endorsed (see above). History of seizure activity: Endorsed. She reported isolated seizure activity about 25-30 years prior. The cause was unknown; however, there were reportedly suspicions that this may have represented symptoms stemming from migraine headaches. No other seizure activity was reported.  History of known exposure to toxins: Denied. Symptoms of chronic pain: Endorsed. She has a history of fibromyalgia and described diffuse body pain.  Experience of frequent headaches/migraines: Denied. She reported undergoing acupuncture around the time of her potential seizure event and has since been able to get  headache symptoms better managed.   Sensory changes: She uses reading glasses with benefit. She contracted COVID-19 twice in 2020 and 2021. Stemming from the latter infection, she described a greatly diminished sense of taste and smell. Neither has fully recovered presently. No hearing related difficulties were reported.  Balance/coordination difficulties: Endorsed. She described her balance as "not what it used to be," noting some instability and a few falls over the years. One side of the body was not said to be worse than the other.  Other motor difficulties: Endorsed. She reported action tremors impacting her upper extremities, right worse than left.   Other medical conditions: As stated above, she contracted COVID-19 both in 2020 and 2021. She noted that some physicians have labeled her as having long-haul COVID symptoms and that this may create some of her current clinical presentation.   Sleep History: Estimated hours obtained each night: Unclear. She described her sleep as "off and on" and did not provide a numerical estimation.  Difficulties falling asleep: Endorsed. She described instances where she will unintentionally nap/sleep during the day and then have have nights where she is unable to sleep at all.  Difficulties staying asleep: Endorsed.  Feels rested and refreshed upon awakening: Denied.  History of snoring: Endorsed. Symptoms were said to occur occasionally rather than consistently.  History of waking up gasping for air: Denied. Witnessed breath cessation while  asleep: Denied.  History of vivid dreaming: Denied. Excessive movement while asleep: Denied. Instances of acting out her dreams: Denied.  Psychiatric/Behavioral Health History: Depression: When asked her current mood, she acknowledged feeling nervous about her perception of progressive cognitive decline. She did not outright report symptoms of depression or other mental health concerns. She did acknowledge a remote  period of depressed mood in the context of a forced transfer or change to her job several years prior. Current or remote suicidal ideation, intent, or plan was denied.  Anxiety: Denied. Mania: Denied. Trauma History: Denied. Visual/auditory hallucinations: Denied. Delusional thoughts: Denied.  Tobacco: Denied. Alcohol: She denied current alcohol consumption as well as a history of problematic alcohol abuse or dependence.  Recreational drugs: Denied.  Family History: Problem Relation Age of Onset   Other Mother        blood clot history, varicose veins   Stroke Mother    Diabetes Mother    Cancer Father    Healthy Brother    Healthy Brother    Healthy Brother    Stroke Maternal Grandmother    Diabetes Daughter    Dementia Other    Alzheimer's disease Other        several family members stemming from intermarriage   This information was confirmed by Debra Jordan.  Academic/Vocational History: Highest level of educational attainment: 16 years. She earned three separate Bachelor's degrees in business management, accounting, and forensic accounting. She described herself as a good (A/B) student in academic settings. She identified perhaps a relative weakness in science and history based courses.  History of developmental delay: Denied. History of grade repetition: Denied. Enrollment in special education courses: Denied. History of LD: Denied.  Employment: Retired. She previously worked in Therapist, art positions over the years. Per her description, her positions were advanced, requiring knowledge of tax information across all 50 states.   Evaluation Results:   Behavioral Observations: Debra Jordan. Mccalman was unaccompanied, arrived to her appointment on time, and was appropriately dressed and groomed. She appeared alert and oriented. Observed gait and station were within normal limits. Gross motor functioning appeared intact upon informal observation and no  abnormal movements (e.g., tremors) were noted. Her affect was generally relaxed and positive, but did range appropriately given the subject being discussed during the clinical interview. Spontaneous speech was fluent and word finding difficulties were not observed during the clinical interview. Thought processes were coherent, organized, and normal in content. Insight into her cognitive difficulties appeared adequate.   During testing, sustained attention was appropriate. Task engagement was adequate and she persisted when challenged. Overall, Debra Jordan. Bianchi was cooperative with the clinical interview and subsequent testing procedures.   Adequacy of Effort: The validity of neuropsychological testing is limited by the extent to which the individual being tested may be assumed to have exerted adequate effort during testing. Debra Jordan. Westervelt expressed her intention to perform to the best of her abilities and exhibited adequate task engagement and persistence. Scores across stand-alone and embedded performance validity measures were within expectation. As such, the results of the current evaluation are believed to be a valid representation of Debra Jordan. Spinney' current cognitive functioning.  Test Results: Debra Jordan. Poppy was fully oriented at the time of the current evaluation.  Intellectual abilities based upon educational and vocational attainment were estimated to be in the average range. Premorbid abilities were estimated to be within the average range based upon a single-word reading test.   Processing speed was below average to average. Basic  attention was well below average. More complex attention (e.g., working memory) was average. Executive functioning was variable, ranging from the exceptionally low to above average normative ranges.  While not directly assessed, receptive language abilities were believed to be intact. Likewise, Debra Jordan. Pelissier did not exhibit any difficulties comprehending task instructions and answered all  questions asked of her appropriately. Assessed expressive language (e.g., verbal fluency and confrontation naming) was average to well above average.     Assessed visuospatial/visuoconstructional abilities were average.    Learning (i.e., encoding) of novel verbal and visual information was average. Spontaneous delayed recall (i.e., retrieval) of previously learned information was well below average across a story-task but average to above average across all other tasks. Retention rates were 79% across a story learning task, 125% across a list learning task, 94% across a daily living task, and 100% across a shape learning task. Performance across recognition tasks was appropriate, suggesting evidence for information consolidation.   Results of emotional screening instruments suggested that recent symptoms of generalized anxiety were in the minimal range, while symptoms of depression were within the mild range. She elevated the somatic complaints clinical subscale across a more comprehensive personality inventory. A screening instrument assessing recent sleep quality suggested the presence of moderate sleep dysfunction.  Tables of Scores:   Note: This summary of test scores accompanies the interpretive report and should not be considered in isolation without reference to the appropriate sections in the text. Descriptors are based on appropriate normative data and may be adjusted based on clinical judgment. Terms such as "Within Normal Limits" and "Outside Normal Limits" are used when a more specific description of the test score cannot be determined.       Percentile - Normative Descriptor > 98 - Exceptionally High 91-97 - Well Above Average 75-90 - Above Average 25-74 - Average 9-24 - Below Average 2-8 - Well Below Average < 2 - Exceptionally Low       Orientation:      Raw Score Percentile   NAB Orientation, Form 1 29/29 --- ---       Cognitive Screening:      Raw Score Percentile    SLUMS: 30/30 --- ---       Intellectual Functioning:      Standard Score Percentile   Test of Premorbid Functioning: 97 42 Average       Memory:     NAB Memory Module, Form 1: Standard Score/ T Score Percentile   Total Memory Index 98 45 Average  List Learning       Total Trials 1-3 22/36 (43) 25 Average    List B 4/12 (44) 27 Average    Short Delay Free Recall 8/12 (49) 46 Average    Long Delay Free Recall 10/12 (59) 82 Above Average    Retention Percentage 125 (60) 84 Above Average    Recognition Discriminability 10 (57) 75 Above Average  Shape Learning       Total Trials 1-3 17/27 (52) 58 Average    Delayed Recall 7/9 (58) 79 Above Average    Retention Percentage 100 (51) 54 Average    Recognition Discriminability 5 (42) 21 Below Average  Story Learning       Immediate Recall 65/80 (49) 46 Average    Delayed Recall 27/40 (36) 8 Well Below Average    Retention Percentage 79 (45) 31 Average  Daily Living Memory       Immediate Recall 44/51 (49) 46 Average    Delayed Recall 15/17 (  51) 54 Average    Retention Percentage 94 (54) 66 Average    Recognition Hits 10/10 (60) 84 Above Average       Attention/Executive Function:     Trail Making Test (TMT): Raw Score (T Score) Percentile     Part A 40 secs.,  1 error (41) 18 Below Average    Part B 64 secs.,  1 error (51) 54 Average         Scaled Score Percentile   WAIS-IV Coding: 8 25 Average       NAB Attention Module, Form 1: T Score Percentile     Digits Forward 32 4 Well Below Average    Digits Backwards 45 31 Average       D-KEFS Color-Word Interference Test: Raw Score (Scaled Score) Percentile     Color Naming 36 secs. (9) 37 Average    Word Reading 29 secs. (8) 25 Average    Inhibition 73 secs. (9) 37 Average      Total Errors 8 errors (3) 1 Exceptionally Low    Inhibition/Switching 65 secs. (12) 75 Above Average      Total Errors 2 errors (11) 63 Average       D-KEFS Verbal Fluency Test: Raw Score (Scaled  Score) Percentile     Letter Total Correct 48 (14) 91 Well Above Average    Category Total Correct 42 (13) 84 Above Average    Category Switching Total Correct 11 (8) 25 Average    Category Switching Accuracy 10 (9) 37 Average      Total Set Loss Errors 1 (11) 63 Average      Total Repetition Errors 0 (13) 84 Above Average       Language:     Verbal Fluency Test: Raw Score (T Score) Percentile     Phonemic Fluency (FAS) 48 (53) 62 Average    Animal Fluency 22 (53) 62 Average        NAB Language Module, Form 1: T Score Percentile     Naming 31/31 (56) 73 Average    Writing 52 58 Average       Visuospatial/Visuoconstruction:      Raw Score Percentile   Clock Drawing: 10/10 --- Within Normal Limits       NAB Spatial Module, Form 1: T Score Percentile     Figure Drawing Copy 45 31 Average        Scaled Score Percentile   WAIS-IV Block Design: 10 50 Average  WAIS-IV Matrix Reasoning: 10 50 Average       Mood and Personality:      Raw Score Percentile   Beck Depression Inventory - II: 18 --- Mild  PROMIS Anxiety Questionnaire: 10 --- None to Slight       Personality Assessment Inventory: T Score  Percentile     Inconsistency 49 --- Within Normal Limits    Infrequency 47 --- Within Normal Limits    Negative Impression 59 --- Within Normal Limits    Positive Impression 52 --- Within Normal Limits    Somatic Complaints 86 --- Elevated    Anxiety 50 --- Within Normal Limits    Anxiety-Related Disorders 45 --- Within Normal Limits    Depression 66 --- Within Normal Limits    Mania 51 --- Within Normal Limits    Paranoia 43 --- Within Normal Limits    Schizophrenia 58 --- Within Normal Limits    Borderline Features 45 --- Within Normal Limits    Antisocial Features 39 ---  Within Normal Limits    Alcohol Problems 41 --- Within Normal Limits    Drug Problems 48 --- Within Normal Limits    Aggression 42 --- Within Normal Limits    Suicidal Ideation 43 --- Within Normal Limits     Stress 68 --- Within Normal Limits    Non Support 64 --- Within Normal Limits    Treatment Rejection 48 --- Within Normal Limits    Dominance 60 --- Within Normal Limits    Warmth 40 --- Within Normal Limits       Additional Questionnaires:      Raw Score Percentile   PROMIS Sleep Disturbance Questionnaire: 35 --- Moderate   Informed Consent and Coding/Compliance:   The current evaluation represents a clinical evaluation for the purposes previously outlined by the referral source and is in no way reflective of a forensic evaluation.   Debra Jordan. Woolbright was provided with a verbal description of the nature and purpose of the present neuropsychological evaluation. Also reviewed were the foreseeable risks and/or discomforts and benefits of the procedure, limits of confidentiality, and mandatory reporting requirements of this provider. The patient was given the opportunity to ask questions and receive answers about the evaluation. Oral consent to participate was provided by the patient.   This evaluation was conducted by Christia Reading, Ph.D., ABPP-CN, board certified clinical neuropsychologist. Debra Jordan. Rens completed a clinical interview with Dr. Melvyn Novas, billed as one unit [phone], and 165 minutes of cognitive testing and scoring, billed as one unit [phone] and five additional units [id number]. Psychometrist Milana Kidney, B.S., assisted Dr. Melvyn Novas with test administration and scoring procedures. As a separate and discrete service, Dr. Melvyn Novas spent a total of 160 minutes in interpretation and report writing billed as one unit [phone] and two units 96133.

## 2022-11-07 NOTE — Progress Notes (Signed)
   Psychometrician Note   Cognitive testing was administered to Debra Jordan by Debra Jordan, B.S. (psychometrist) under the supervision of Dr. Christia Reading, Ph.D., licensed psychologist on 11/07/2022. Debra Jordan did not appear overtly distressed by the testing session per behavioral observation or responses across self-report questionnaires. Rest breaks were offered.    The battery of tests administered was selected by Dr. Christia Reading, Ph.D. with consideration to Debra Jordan's current level of functioning, the nature of her symptoms, emotional and behavioral responses during interview, level of literacy, observed level of motivation/effort, and the nature of the referral question. This battery was communicated to the psychometrist. Communication between Dr. Christia Reading, Ph.D. and the psychometrist was ongoing throughout the evaluation and Dr. Christia Reading, Ph.D. was immediately accessible at all times. Dr. Christia Reading, Ph.D. provided supervision to the psychometrist on the date of this service to the extent necessary to assure the quality of all services provided.    Debra Jordan will return within approximately 1-2 weeks for an interactive feedback session with Dr. Melvyn Novas at which time her test performances, clinical impressions, and treatment recommendations will be reviewed in detail. Debra Jordan understands she can contact our office should she require our assistance before this time.  A total of 165 minutes of billable time were spent face-to-face with Debra Jordan by the psychometrist. This includes both test administration and scoring time. Billing for these services is reflected in the clinical report generated by Dr. Christia Reading, Ph.D.  This note reflects time spent with the psychometrician and does not include test scores or any clinical interpretations made by Dr. Melvyn Novas. The full report will follow in a separate note.

## 2022-11-08 ENCOUNTER — Encounter: Payer: Self-pay | Admitting: Psychology

## 2022-11-09 NOTE — Telephone Encounter (Signed)
Checking with [facility] now

## 2022-11-09 NOTE — Telephone Encounter (Addendum)
[facility] is calling in asking if the LB puncture has been done and or if the sample is on the way.  Stated they still havent received anything.

## 2022-11-10 NOTE — Telephone Encounter (Signed)
Samples have been received at the [facility] clinic

## 2022-11-11 ENCOUNTER — Telehealth: Payer: Self-pay

## 2022-11-11 NOTE — Telephone Encounter (Signed)
Debra Jordan has two open orders for this patient and is asking if this is a duplicate or if the samples will be coming as well as needing clarification on patients name.

## 2022-11-11 NOTE — Telephone Encounter (Signed)
Going to send results over now

## 2022-11-17 ENCOUNTER — Ambulatory Visit: Payer: Medicare Other | Admitting: Psychology

## 2022-11-17 DIAGNOSIS — R4189 Other symptoms and signs involving cognitive functions and awareness: Secondary | ICD-10-CM

## 2022-11-17 DIAGNOSIS — I6381 Other cerebral infarction due to occlusion or stenosis of small artery: Secondary | ICD-10-CM | POA: Diagnosis not present

## 2022-11-17 DIAGNOSIS — F451 Undifferentiated somatoform disorder: Secondary | ICD-10-CM | POA: Diagnosis not present

## 2022-11-17 DIAGNOSIS — M797 Fibromyalgia: Secondary | ICD-10-CM | POA: Diagnosis not present

## 2022-11-17 NOTE — Progress Notes (Signed)
   Neuropsychology Feedback Session Debra Jordan. [facility]  Reason for Referral:   Debra Jordan is a 72 y.o. ambidextrous Caucasian female referred by Sharene Butters, PA-C, to characterize her current cognitive functioning and assist with diagnostic clarity and treatment planning in the context of subjective cognitive decline.   Feedback:   [name] completed a comprehensive neuropsychological evaluation on 11/07/2022. Please refer to that encounter for the full report and recommendations. Briefly, results suggested neuropsychological functioning largely within normal limits relative to age-matched peers. An isolated weakness was exhibited across basic attention. However, performances across complex attention were appropriate. Another isolated weakness was exhibited across a story-based delayed recall task. However, she still retained 79% of previously learned information and consistently performed very well across all aspects of three other memory tasks. The etiology for subjective dysfunction is likely multifactorial in nature. Across a comprehensive personality questionnaire, her responses suggested that she has an unusually high degree of concern surrounding physical functioning and health matters, with probable impairment surrounding somatic symptoms. Physical complaints likely include numerous biological systems, including neurological, gastrointestinal, and musculoskeletal systems. Her overall item endorsement pattern aligns with both conversion and somatization disorders. Outside of somatic complaints, responses also suggested transient depressive symptoms and the potential for her change in functional status to directly impact her overall mood. Overall, concerns for an underlying somatic symptom disorder, coupled with her history of ADHD, fibromyalgia and chronic pain, psychiatric distress, sleep dysfunction, and medical ailments/cerebrovascular changes  all can create [name]' subjective experience, especially when combined. This combination represents the most likely cause for subjective dysfunction at the present time. There is not compelling neurocognitive evidence to suggest an underlying neurological cause.   [name] was unaccompanied during the current feedback session. Content of the current session focused on the results of her neuropsychological evaluation. [name] was given the opportunity to ask questions and her questions were answered. She was encouraged to reach out should additional questions arise. A copy of her report was provided at the conclusion of the visit.      Greater than 31 minutes were spent preparing for, conducting, and documenting the current feedback session with [name], billed as one unit [phone].

## 2022-11-22 LAB — MAYO MISC ORDER 2
Miscellaneous Test Results: NEGATIVE
NORMAL RANGE:: NEGATIVE
PRICE:: 125
PRICE:: 1288

## 2022-11-22 LAB — MAYO MISC ORDER: PRICE:: 1740.8

## 2022-11-23 LAB — EXTRA SPECIMEN

## 2022-11-23 LAB — MAYO MISC ORDER 2: PRICE:: 0

## 2022-11-23 LAB — EXTRA LAV TOP TUBE

## 2022-11-25 LAB — CSF CELL COUNT WITH DIFFERENTIAL
RBC Count, CSF: 550 cells/uL — ABNORMAL HIGH
TOTAL NUCLEATED CELL: 3 cells/uL (ref 0–5)

## 2022-11-25 LAB — CSF CULTURE W GRAM STAIN
MICRO NUMBER:: 14668126
Result:: NO GROWTH
SPECIMEN QUALITY:: ADEQUATE

## 2022-11-25 LAB — LYME DISEASE ABS IGG, IGM, IFA, CSF
Lyme Disease AB (IgG), IBL: NOT DETECTED
Lyme Disease AB (IgM), IBL: NOT DETECTED

## 2022-11-25 LAB — CRYPTOCOCCAL AG, LTX SCR RFLX TITER
Cryptococcal Ag Screen: NOT DETECTED
MICRO NUMBER:: 14668124
SPECIMEN QUALITY:: ADEQUATE

## 2022-11-25 LAB — GLUCOSE, CSF: Glucose, CSF: 138 mg/dL — ABNORMAL HIGH (ref 40–80)

## 2022-11-25 LAB — ANGIOTENSIN CONVERTING ENZYME, CSF: ANGIOTENSIN CONVERTING ENZYME ( ACE) CSF: 7 U/L (ref <=15)

## 2022-11-25 LAB — FUNGUS CULTURE W SMEAR
CULTURE:: NO GROWTH
MICRO NUMBER:: 14668125
SMEAR:: NONE SEEN
SPECIMEN QUALITY:: ADEQUATE

## 2022-11-25 LAB — HERPES SIMPLEX VIRUS, TYPE 1 AND 2 DNA,QUAL,RT PCR
HSV 1 DNA: NOT DETECTED
HSV 2 DNA: NOT DETECTED

## 2022-11-25 LAB — PROTEIN, CSF 14-3-3 (PRION DISEASE)
14-3-3 PROTEIN (CSF)++: 7919 AU/ml — ABNORMAL HIGH (ref 173–1999)
EST PROB PRION DIS IN PATIENT: <0.2 %
RT-QUIC (CSF)*: NEGATIVE
T-TAU PROTEIN (CSF)++: 238 pg/ml (ref 0–1149)

## 2022-11-25 LAB — VDRL, CSF: VDRL Quant, CSF: NONREACTIVE

## 2022-11-25 LAB — CNS IGG SYNTHESIS RATE, CSF+BLOOD
Albumin Serum: 4.4 g/dL (ref 3.6–5.1)
Albumin, CSF: 35.7 mg/dL (ref 8.0–42.0)
CNS-IgG Synthesis Rate: 2.1 mg/24 h (ref <=3.3)
IgG (Immunoglobin G), Serum: 1150 mg/dL (ref 600–1540)
IgG Total CSF: 5.4 mg/dL (ref 0.8–7.7)
IgG-Index: 0.58 (ref <0.70)

## 2022-11-25 LAB — OLIGOCLONAL BANDS, CSF + SERM: Oligo Bands: ABSENT

## 2022-11-25 LAB — PROTEIN, CSF: Total Protein, CSF: 63 mg/dL — ABNORMAL HIGH (ref 15–60)

## 2022-11-25 LAB — MYELIN BASIC PROTEIN, CSF: Myelin Basic Protein: <2 mcg/L (ref <=4.0)

## 2022-11-30 ENCOUNTER — Telehealth: Payer: Self-pay | Admitting: Physician Assistant

## 2022-11-30 NOTE — Telephone Encounter (Signed)
[facility] called in stating they have an order, but it's missing the diagnosis code, ordering doctor, pt address, and insurance information. Fax [phone] Myrene Buddy

## 2022-11-30 NOTE — Telephone Encounter (Signed)
Resent fax with information

## 2023-01-11 ENCOUNTER — Encounter: Payer: Self-pay | Admitting: Physical Medicine and Rehabilitation

## 2023-01-11 ENCOUNTER — Ambulatory Visit (INDEPENDENT_AMBULATORY_CARE_PROVIDER_SITE_OTHER): Payer: Medicare Other | Admitting: Physical Medicine and Rehabilitation

## 2023-01-11 DIAGNOSIS — M5416 Radiculopathy, lumbar region: Secondary | ICD-10-CM

## 2023-01-11 DIAGNOSIS — M47816 Spondylosis without myelopathy or radiculopathy, lumbar region: Secondary | ICD-10-CM

## 2023-01-11 DIAGNOSIS — M5116 Intervertebral disc disorders with radiculopathy, lumbar region: Secondary | ICD-10-CM | POA: Diagnosis not present

## 2023-01-11 NOTE — Progress Notes (Signed)
   01/11/23 1451  Fall Screening  Falls in the past year? 0  Number of falls in past year 0  Was there an injury with Fall? 0  Fall Risk Category Calculator 0  Fall Risk  Patient at Risk for Falls Due to No Fall Risks  Fall risk Follow up Falls evaluation completed;Falls prevention discussed

## 2023-01-11 NOTE — Progress Notes (Signed)
Low back pain/ Left hip pain 6-8 weeks of pain Larey Seat and landed on the left side  Does state she has pain down into the legs Aleve/tylenol for the pain- minimal relief  Denies any PT/injection for the lower back pain  Most recent MRI was in 2019

## 2023-01-11 NOTE — Progress Notes (Signed)
Debra Jordan - 72 y.o. female MRN [id number]  Date of birth: 08-19-1951  Office Visit Note: Visit Date: 01/11/2023 PCP: Van Clines, MD Referred by: Van Clines, MD  Subjective: Chief Complaint  Patient presents with   Lower Back - Pain   HPI: Debra Jordan is a 72 y.o. female who comes in today as self referral for evaluation of chronic, worsening and severe bilateral lower back pain radiating to left hip, groin and down anterior thigh. Former patient of Dr. Norlene Campbell. Pain going for several years, reports left sided hip and groin pain started after mechanical fall about 6 weeks ago. She describes pain as burning and hot sensation, currently rates as 8 out of 10. Some relief of pain with home exercise regimen, heating pad, rest and medications. Patient did attend formal physical therapy many years ago, no relief of pain with these treatments. Some relief of pain with Flexeril. Lumbar MRI from 2019 exhibits left foraminal disc protrusion at L4-L5 impinging on the exiting left L4 root. There is moderate to advanced facet arthropathy on the right at L5-S1. No high grade spinal canal stenosis. No history of lumbar surgery/injections. Patient does report increased stress as she was recently forced to move, does care for her handicapped daughter. Patient does carry diagnosis of fibromyalgia, chronic pain, somatic symptom disorder, diabetes mellitus and chronic migraines.     Review of Systems  Musculoskeletal:  Positive for back pain and myalgias.  Neurological:  Negative for tingling, sensory change, focal weakness and weakness.  All other systems reviewed and are negative.  Otherwise per HPI.  Assessment & Plan: Visit Diagnoses:    ICD-10-CM   1. Lumbar radiculopathy  M54.16 MR LUMBAR SPINE WO CONTRAST    2. Intervertebral disc disorders with radiculopathy, lumbar region  M51.16 MR LUMBAR SPINE WO CONTRAST    3. Spondylosis without myelopathy or radiculopathy, lumbar  region  M47.816 MR LUMBAR SPINE WO CONTRAST    4. Facet arthropathy, lumbar  M47.816 MR LUMBAR SPINE WO CONTRAST       Plan: Findings:  Chronic, worsening and severe bilateral lower back pain radiating to left hip, groin and anterior thigh. Patient continues to have severe pain despite good conservative therapies such as formal physical therapy, home exercise regimen, rest and use of medications. Patients clinical presentation and exam are consistent with L4 nerve pattern. I also feel her chronic pain/fibromyalgia is working to exacerbate her symptoms. Her exam today demonstrates generalized body pain, entire back is diffusely tender with palpation. Next step is to obtain new lumbar MRI imaging. Depending on results of imaging we discussed performing lumbar epidural steroid injection. I will have patient follow up to discuss lumbar MRI results. She is to continue with current medication regimen. No red flag symptoms noted upon exam today.     Meds & Orders: No orders of the defined types were placed in this encounter.   Orders Placed This Encounter  Procedures   MR LUMBAR SPINE WO CONTRAST    Follow-up: Return for follow up for lumbar MRI review.   Procedures: No procedures performed      Clinical History: Study Result  Narrative & Impression CLINICAL DATA:  Chronic low back and left leg pain which has worsened in the past 3 months. No known injury.   Creatinine was obtained on site at [facility] at [address]   Results: Creatinine 2.0 mg/dL.   EXAM: MRI LUMBAR SPINE WITHOUT CONTRAST   TECHNIQUE: Multiplanar,  multisequence MR imaging of the lumbar spine was performed. No intravenous contrast was administered.   COMPARISON:  CT abdomen and pelvis 05/27/2016.   FINDINGS: This examination was ordered with IV contrast but contrast could not be given due to renal insufficiency. Based images provided, contrast would not add to the diagnostic utility of this  exam.   Segmentation: Standard.   Alignment: Trace retrolisthesis L2 on L3, L3 on L4 and L4 on L5 is identified.   Vertebrae: No fracture or worrisome lesion. Small, chronic Schmorl's node in the inferior endplate L2 is noted.   Conus medullaris and cauda equina: Conus extends to the L2-3 level. Conus and cauda equina appear normal.   Paraspinal and other soft tissues: Status post left nephrectomy. Otherwise unremarkable.   Disc levels:   T11-12 is imaged in the sagittal plane only. There is a shallow bulge without stenosis.   T12-L1: Negative.   L1-2: Negative.   L2-3: Minimal disc bulge without stenosis.   L3-4: Shallow disc bulge is more prominent to the left. The central canal and foramina remain open.   L4-5: A left foraminal protrusion causes moderately severe to severe stenosis and encroachment on the exiting left L4 root. There is mild right foraminal narrowing and mild central canal stenosis overall due to a shallow disc bulge and ligamentum flavum thickening.   L5-S1: Moderate to advanced facet degenerative disease is worse on the right. No disc bulge or protrusion. The central canal and foramina are open.   IMPRESSION: Left foraminal protrusion at L4-5 impinges on the exiting left L4 root. There is mild central canal stenosis and right foraminal narrowing at this level.   Mild disc bulging L2-3 and L3-4 without stenosis.     Electronically Signed   By: Drusilla Kanner M.D.   On: 02/24/2018 10:18   She reports that she has never smoked. She has never used smokeless tobacco. No results for input(s): "HGBA1C", "LABURIC" in the last 8760 hours.  Objective:  VS:  HT:    WT:   BMI:     BP:   HR: bpm  TEMP: ( )  RESP:  Physical Exam Vitals and nursing note reviewed.  HENT:     Head: Normocephalic and atraumatic.     Right Ear: External ear normal.     Left Ear: External ear normal.     Nose: Nose normal.     Mouth/Throat:     Mouth: Mucous  membranes are moist.  Eyes:     Extraocular Movements: Extraocular movements intact.  Cardiovascular:     Rate and Rhythm: Normal rate.     Pulses: Normal pulses.  Pulmonary:     Effort: Pulmonary effort is normal.  Abdominal:     General: Abdomen is flat. There is no distension.  Musculoskeletal:        General: Tenderness present.     Cervical back: Normal range of motion.     Comments: Patient rises from seated position to standing without difficulty. Good lumbar range of motion. No pain noted with facet loading. 5/5 strength noted with bilateral hip flexion, knee flexion/extension, ankle dorsiflexion/plantarflexion and EHL. No clonus noted bilaterally. No pain upon palpation of greater trochanters. No pain with internal/external rotation of bilateral hips. Sensation intact bilaterally. Dysesthesias noted to left L4 dermatome. Negative slump test bilaterally. Ambulates without aid, gait steady.     Skin:    General: Skin is warm and dry.     Capillary Refill: Capillary refill takes less than 2  seconds.  Neurological:     General: No focal deficit present.     Mental Status: She is alert and oriented to person, place, and time.  Psychiatric:        Mood and Affect: Mood normal.        Behavior: Behavior normal.     Ortho Exam  Imaging: No results found.  Past Medical/Family/Surgical/Social History: Medications & Allergies reviewed per EMR, new medications updated. Patient Active Problem List   Diagnosis Date Noted   Subjective memory complaints 11/07/2022   Somatic symptom disorder 11/07/2022   Lacunar infarction    ADHD (attention deficit hyperactivity disorder) 11/04/2022   Chronic kidney disease 11/04/2022   Diabetic retinopathy 11/04/2022   Fibromyalgia 11/04/2022   History of COVID-19    Family history of Alzheimer's disease 10/12/2022   Bilateral shoulder pain 05/13/2022   Gallstones without obstruction of gallbladder 10/04/2021   Pancreatic cyst 10/04/2021    Epigastric pain 09/17/2021   Elevated troponin 09/17/2021   Hypercalcemia 09/17/2021   Pain in right shoulder 08/06/2020   Osteoarthritis, hand 08/06/2020   Mass of finger of right hand 03/11/2019   Chronic fatigue, unspecified 03/03/2019   Blue toe syndrome 10/06/2011   Type II diabetes mellitus 09/06/2011   HTN (hypertension) 09/06/2011   Hypertriglyceridemia 09/06/2011   Ischemia of extremity 09/06/2011   Hypothyroidism 09/06/2011   Gout 09/06/2011   Migraine headache 09/06/2011   History of nephrectomy, unilateral 09/06/2011   Pain in toe of right foot 09/05/2011   Pain in right hip 08/22/1972   Past Medical History:  Diagnosis Date   ADHD (attention deficit hyperactivity disorder)    Arthritis    gout   Bilateral shoulder pain 05/13/2022   Blue toe syndrome 10/06/2011   Broken shoulder    And tailbone    Bursitis    Hip and Shoulder per patient   Cataract    Mixed form OU   Chronic fatigue, unspecified 03/03/2019   Chronic kidney disease    hx of left nephrectomy   Diabetic retinopathy    NPDR OU   Dyspnea    Since having COVID   Elevated troponin 09/17/2021   Epigastric pain 09/17/2021   Family history of Alzheimer's disease 10/12/2022   Fatty liver    Fibromyalgia    Gallstones without obstruction of gallbladder 10/04/2021   Gout 09/06/2011   History of COVID-19 03/03/2019   History of headache    History of kidney stones    History of nephrectomy, unilateral 09/06/2011   History of PCOS    History of skin cancer    Skin Cancer   History of venous thrombosis and embolism    Left great toe   HTN (hypertension) 09/06/2011   Hypercalcemia 09/17/2021   Hyperlipidemia    Hypersomnolence    Hypertensive retinopathy    OU   Hypertriglyceridemia 09/06/2011   Hypothyroidism 09/06/2011   Ischemia of extremity 09/06/2011   Lacunar infarction    left periatrial white matter   Mass of finger of right hand 03/11/2019   Migraine headache 09/06/2011    Neuropathy    feet bilateral   Obesity    Osteoarthritis, hand 08/06/2020   Pain in right hip 08/22/1972   Pain in right shoulder 08/06/2020   Pain in toe of right foot 09/05/2011   Pancreatic cyst 10/04/2021   Pneumonia    Seizure disorder    Somatic symptom disorder 11/07/2022   Subjective memory complaints 11/07/2022   Trouble swallowing    Type  II diabetes mellitus 09/06/2011   Family History  Problem Relation Age of Onset   Other Mother        blood clot history, varicose veins   Stroke Mother    Diabetes Mother    Cancer Father    Healthy Brother    Healthy Brother    Healthy Brother    Stroke Maternal Grandmother    Diabetes Daughter    Dementia Other    Alzheimer's disease Other        several family members stemming from intermarriage   Past Surgical History:  Procedure Laterality Date   ABDOMINAL HYSTERECTOMY     CHOLECYSTECTOMY N/A 10/22/2021   Procedure: LAPAROSCOPIC CHOLECYSTECTOMY;  Surgeon: Fritzi Mandes, MD;  Location: [facility] OR;  Service: General;  Laterality: N/A;   HERNIA REPAIR     LYMPH NODE BIOPSY     NASAL SINUS SURGERY     NEPHRECTOMY Left 2002   left nephrectomy   TONSILLECTOMY  1969   TUBAL LIGATION     Social History   Occupational History    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

## 2023-01-17 ENCOUNTER — Ambulatory Visit (INDEPENDENT_AMBULATORY_CARE_PROVIDER_SITE_OTHER): Payer: Medicare Other | Admitting: Orthopaedic Surgery

## 2023-01-17 ENCOUNTER — Other Ambulatory Visit (INDEPENDENT_AMBULATORY_CARE_PROVIDER_SITE_OTHER): Payer: Medicare Other

## 2023-01-17 ENCOUNTER — Encounter: Payer: Self-pay | Admitting: Orthopaedic Surgery

## 2023-01-17 DIAGNOSIS — M1612 Unilateral primary osteoarthritis, left hip: Secondary | ICD-10-CM | POA: Diagnosis not present

## 2023-01-17 DIAGNOSIS — M25552 Pain in left hip: Secondary | ICD-10-CM | POA: Diagnosis not present

## 2023-01-17 NOTE — Progress Notes (Signed)
Office Visit Note   Patient: Debra Jordan           Date of Birth: 1951-01-24           MRN: [id number] Visit Date: 01/17/2023              Requested by: Van Clines, MD [address] [place],  [place] [address] PCP: Pcp, No   Assessment & Plan: Visit Diagnoses:  1. Primary osteoarthritis of left hip     Plan: Impression is 72 year old female with exacerbation of pre-existing left hip DJD.  She does have a fair amount of DJD with periarticular spurring and sclerosis.  Given her options she would like to try outpatient physical therapy and intra-articular steroid injection.  Will see her back if symptoms persist beyond 6 weeks.  Follow-Up Instructions: No follow-ups on file.   Orders:  Orders Placed This Encounter  Procedures   XR HIP UNILAT W OR W/O PELVIS 2-3 VIEWS LEFT   Ambulatory referral to Physical Therapy   AMB referral to sports medicine   No orders of the defined types were placed in this encounter.     Procedures: No procedures performed   Clinical Data: No additional findings.   Subjective: Chief Complaint  Patient presents with   Left Hip - Pain    HPI Debra Jordan is a 72 year old female comes in for left hip pain for about 2 months.  She is experiencing buttock pain that wraps around into the groin and upper thigh pain.  She denies any changes in activity. Review of Systems  Constitutional: Negative.   HENT: Negative.    Eyes: Negative.   Respiratory: Negative.    Cardiovascular: Negative.   Endocrine: Negative.   Musculoskeletal: Negative.   Neurological: Negative.   Hematological: Negative.   Psychiatric/Behavioral: Negative.    All other systems reviewed and are negative.    Objective: Vital Signs: There were no vitals taken for this visit.  Physical Exam Vitals and nursing note reviewed.  Constitutional:      Appearance: She is well-developed.  HENT:     Head: Atraumatic.     Nose: Nose normal.  Eyes:      Extraocular Movements: Extraocular movements intact.  Cardiovascular:     Pulses: Normal pulses.  Pulmonary:     Effort: Pulmonary effort is normal.  Abdominal:     Palpations: Abdomen is soft.  Musculoskeletal:     Cervical back: Neck supple.  Skin:    General: Skin is warm.     Capillary Refill: Capillary refill takes less than 2 seconds.  Neurological:     Mental Status: She is alert. Mental status is at baseline.  Psychiatric:        Behavior: Behavior normal.        Thought Content: Thought content normal.        Judgment: Judgment normal.     Ortho Exam Examination left hip shows reproducible pain within the hip joint with movement of the hip joint. Specialty Comments:  Study Result  Narrative & Impression CLINICAL DATA:  Chronic low back and left leg pain which has worsened in the past 3 months. No known injury.   Creatinine was obtained on site at [facility] at [address]   Results: Creatinine 2.0 mg/dL.   EXAM: MRI LUMBAR SPINE WITHOUT CONTRAST   TECHNIQUE: Multiplanar, multisequence MR imaging of the lumbar spine was performed. No intravenous contrast was administered.   COMPARISON:  CT  abdomen and pelvis 05/27/2016.   FINDINGS: This examination was ordered with IV contrast but contrast could not be given due to renal insufficiency. Based images provided, contrast would not add to the diagnostic utility of this exam.   Segmentation: Standard.   Alignment: Trace retrolisthesis L2 on L3, L3 on L4 and L4 on L5 is identified.   Vertebrae: No fracture or worrisome lesion. Small, chronic Schmorl's node in the inferior endplate L2 is noted.   Conus medullaris and cauda equina: Conus extends to the L2-3 level. Conus and cauda equina appear normal.   Paraspinal and other soft tissues: Status post left nephrectomy. Otherwise unremarkable.   Disc levels:   T11-12 is imaged in the sagittal plane only. There is a shallow bulge without  stenosis.   T12-L1: Negative.   L1-2: Negative.   L2-3: Minimal disc bulge without stenosis.   L3-4: Shallow disc bulge is more prominent to the left. The central canal and foramina remain open.   L4-5: A left foraminal protrusion causes moderately severe to severe stenosis and encroachment on the exiting left L4 root. There is mild right foraminal narrowing and mild central canal stenosis overall due to a shallow disc bulge and ligamentum flavum thickening.   L5-S1: Moderate to advanced facet degenerative disease is worse on the right. No disc bulge or protrusion. The central canal and foramina are open.   IMPRESSION: Left foraminal protrusion at L4-5 impinges on the exiting left L4 root. There is mild central canal stenosis and right foraminal narrowing at this level.   Mild disc bulging L2-3 and L3-4 without stenosis.     Electronically Signed   By: Drusilla Kanner M.D.   On: 02/24/2018 10:18  Imaging: XR HIP UNILAT W OR W/O PELVIS 2-3 VIEWS LEFT  Result Date: 01/17/2023 X-rays demonstrate degenerative changes and superior acetabular spurring consistent with advanced DJD.    PMFS History: Patient Active Problem List   Diagnosis Date Noted   Subjective memory complaints 11/07/2022   Somatic symptom disorder 11/07/2022   Lacunar infarction    ADHD (attention deficit hyperactivity disorder) 11/04/2022   Chronic kidney disease 11/04/2022   Diabetic retinopathy 11/04/2022   Fibromyalgia 11/04/2022   History of COVID-19    Family history of Alzheimer's disease 10/12/2022   Bilateral shoulder pain 05/13/2022   Gallstones without obstruction of gallbladder 10/04/2021   Pancreatic cyst 10/04/2021   Epigastric pain 09/17/2021   Elevated troponin 09/17/2021   Hypercalcemia 09/17/2021   Pain in right shoulder 08/06/2020   Osteoarthritis, hand 08/06/2020   Mass of finger of right hand 03/11/2019   Chronic fatigue, unspecified 03/03/2019   Blue toe syndrome  10/06/2011   Type II diabetes mellitus 09/06/2011   HTN (hypertension) 09/06/2011   Hypertriglyceridemia 09/06/2011   Ischemia of extremity 09/06/2011   Hypothyroidism 09/06/2011   Gout 09/06/2011   Migraine headache 09/06/2011   History of nephrectomy, unilateral 09/06/2011   Pain in toe of right foot 09/05/2011   Pain in right hip 08/22/1972   Past Medical History:  Diagnosis Date   ADHD (attention deficit hyperactivity disorder)    Arthritis    gout   Bilateral shoulder pain 05/13/2022   Blue toe syndrome 10/06/2011   Broken shoulder    And tailbone    Bursitis    Hip and Shoulder per patient   Cataract    Mixed form OU   Chronic fatigue, unspecified 03/03/2019   Chronic kidney disease    hx of left nephrectomy   Diabetic retinopathy  NPDR OU   Dyspnea    Since having COVID   Elevated troponin 09/17/2021   Epigastric pain 09/17/2021   Family history of Alzheimer's disease 10/12/2022   Fatty liver    Fibromyalgia    Gallstones without obstruction of gallbladder 10/04/2021   Gout 09/06/2011   History of COVID-19 03/03/2019   History of headache    History of kidney stones    History of nephrectomy, unilateral 09/06/2011   History of PCOS    History of skin cancer    Skin Cancer   History of venous thrombosis and embolism    Left great toe   HTN (hypertension) 09/06/2011   Hypercalcemia 09/17/2021   Hyperlipidemia    Hypersomnolence    Hypertensive retinopathy    OU   Hypertriglyceridemia 09/06/2011   Hypothyroidism 09/06/2011   Ischemia of extremity 09/06/2011   Lacunar infarction    left periatrial white matter   Mass of finger of right hand 03/11/2019   Migraine headache 09/06/2011   Neuropathy    feet bilateral   Obesity    Osteoarthritis, hand 08/06/2020   Pain in right hip 08/22/1972   Pain in right shoulder 08/06/2020   Pain in toe of right foot 09/05/2011   Pancreatic cyst 10/04/2021   Pneumonia    Seizure disorder    Somatic symptom  disorder 11/07/2022   Subjective memory complaints 11/07/2022   Trouble swallowing    Type II diabetes mellitus 09/06/2011    Family History  Problem Relation Age of Onset   Other Mother        blood clot history, varicose veins   Stroke Mother    Diabetes Mother    Cancer Father    Healthy Brother    Healthy Brother    Healthy Brother    Stroke Maternal Grandmother    Diabetes Daughter    Dementia Other    Alzheimer's disease Other        several family members stemming from intermarriage    Past Surgical History:  Procedure Laterality Date   ABDOMINAL HYSTERECTOMY     CHOLECYSTECTOMY N/A 10/22/2021   Procedure: LAPAROSCOPIC CHOLECYSTECTOMY;  Surgeon: Fritzi Mandes, MD;  Location: [facility] OR;  Service: General;  Laterality: N/A;   HERNIA REPAIR     LYMPH NODE BIOPSY     NASAL SINUS SURGERY     NEPHRECTOMY Left 2002   left nephrectomy   TONSILLECTOMY  1969   TUBAL LIGATION     Social History   Occupational History    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

## 2023-01-23 ENCOUNTER — Ambulatory Visit: Payer: Medicare Other | Admitting: Sports Medicine

## 2023-01-25 ENCOUNTER — Ambulatory Visit
Admission: RE | Admit: 2023-01-25 | Discharge: 2023-01-25 | Disposition: A | Discharge status: Home / Self Care | Payer: Medicare Other | Source: Ambulatory Visit | Attending: Physical Medicine and Rehabilitation | Admitting: Physical Medicine and Rehabilitation

## 2023-01-25 DIAGNOSIS — M5116 Intervertebral disc disorders with radiculopathy, lumbar region: Secondary | ICD-10-CM

## 2023-01-25 DIAGNOSIS — M47816 Spondylosis without myelopathy or radiculopathy, lumbar region: Secondary | ICD-10-CM

## 2023-01-25 DIAGNOSIS — M5416 Radiculopathy, lumbar region: Secondary | ICD-10-CM

## 2023-01-27 ENCOUNTER — Ambulatory Visit: Payer: Medicare Other | Admitting: Physician Assistant

## 2023-01-27 ENCOUNTER — Encounter: Payer: Self-pay | Admitting: Physician Assistant

## 2023-01-27 VITALS — BP 120/80 | HR 78 | Resp 20 | Ht 64.0 in | Wt 240.0 lb

## 2023-01-27 DIAGNOSIS — R4189 Other symptoms and signs involving cognitive functions and awareness: Secondary | ICD-10-CM | POA: Diagnosis not present

## 2023-01-27 NOTE — Patient Instructions (Signed)
It was a pleasure to see you today at our office.   Recommendations:     Behavioral health ADD Discontinue donepezil 5 mg daily.     Whom to call:  Memory  decline, memory medications: Call our office [phone]   For psychiatric meds, mood meds: Please have your primary care physician manage these medications.     For assessment of decision of mental capacity and competency:  Call Dr. Erick Blinks, geriatric psychiatrist at [phone]     If you have any severe symptoms of a stroke, or other severe issues such as confusion,severe chills or fever, etc call 911 or go to the ER as you may need to be evaluated further    RECOMMENDATIONS FOR ALL PATIENTS WITH MEMORY PROBLEMS: 1. Continue to exercise (Recommend 30 minutes of walking everyday, or 3 hours every week) 2. Increase social interactions - continue going to [place] and enjoy social gatherings with friends and family 3. Eat healthy, avoid fried foods and eat more fruits and vegetables 4. Maintain adequate blood pressure, blood sugar, and blood cholesterol level. Reducing the risk of stroke and cardiovascular disease also helps promoting better memory. 5. Avoid stressful situations. Live a simple life and avoid aggravations. Organize your time and prepare for the next day in anticipation. 6. Sleep well, avoid any interruptions of sleep and avoid any distractions in the bedroom that may interfere with adequate sleep quality 7. Avoid sugar, avoid sweets as there is a strong link between excessive sugar intake, diabetes, and cognitive impairment We discussed the Mediterranean diet, which has been shown to help patients reduce the risk of progressive memory disorders and reduces cardiovascular risk. This includes eating fish, eat fruits and green leafy vegetables, nuts like almonds and hazelnuts, walnuts, and also use olive oil. Avoid fast foods and fried foods as much as possible. Avoid sweets and sugar as sugar use has been  linked to worsening of memory function.  There is always a concern of gradual progression of memory problems. If this is the case, then we may need to adjust level of care according to patient needs. Support, both to the patient and caregiver, should then be put into place.     FALL PRECAUTIONS: Be cautious when walking. Scan the area for obstacles that may increase the risk of trips and falls. When getting up in the mornings, sit up at the edge of the bed for a few minutes before getting out of bed. Consider elevating the bed at the head end to avoid drop of blood pressure when getting up. Walk always in a well-lit room (use night lights in the walls). Avoid area rugs or power cords from appliances in the middle of the walkways. Use a walker or a cane if necessary and consider physical therapy for balance exercise. Get your eyesight checked regularly.  FINANCIAL OVERSIGHT: Supervision, especially oversight when making financial decisions or transactions is also recommended.  HOME SAFETY: Consider the safety of the kitchen when operating appliances like stoves, microwave oven, and blender. Consider having supervision and share cooking responsibilities until no longer able to participate in those. Accidents with firearms and other hazards in the house should be identified and addressed as well.   ABILITY TO BE LEFT ALONE: If patient is unable to contact 911 operator, consider using LifeLine, or when the need is there, arrange for someone to stay with patients. Smoking is a fire hazard, consider supervision or cessation. Risk of wandering should be assessed by caregiver and if detected  at any point, supervision and safe proof recommendations should be instituted.  MEDICATION SUPERVISION: Inability to self-administer medication needs to be constantly addressed. Implement a mechanism to ensure safe administration of the medications.   DRIVING: Regarding driving, in patients with progressive memory  problems, driving will be impaired. We advise to have someone else do the driving if trouble finding directions or if minor accidents are reported. Independent driving assessment is available to determine safety of driving.   If you are interested in the driving assessment, you can contact the following:  [employer] in [place] [phone]  [facility] [phone]  [facility] [phone] or [phone]      Mediterranean Diet A Mediterranean diet refers to food and lifestyle choices that are based on the traditions of countries located on the [employer]. This way of eating has been shown to help prevent certain conditions and improve outcomes for people who have chronic diseases, like kidney disease and heart disease. What are tips for following this plan? Lifestyle  Cook and eat meals together with your family, when possible. Drink enough fluid to keep your urine clear or pale yellow. Be physically active every day. This includes: Aerobic exercise like running or swimming. Leisure activities like gardening, walking, or housework. Get 7-8 hours of sleep each night. If recommended by your health care provider, drink red wine in moderation. This means 1 glass a day for nonpregnant women and 2 glasses a day for men. A glass of wine equals 5 oz (150 mL). Reading food labels  Check the serving size of packaged foods. For foods such as rice and pasta, the serving size refers to the amount of cooked product, not dry. Check the total fat in packaged foods. Avoid foods that have saturated fat or trans fats. Check the ingredients list for added sugars, such as corn syrup. Shopping  At the grocery store, buy most of your food from the areas near the walls of the store. This includes: Fresh fruits and vegetables (produce). Grains, beans, nuts, and seeds. Some of these may be available in unpackaged forms or  large amounts (in bulk). Fresh seafood. Poultry and eggs. Low-fat dairy products. Buy whole ingredients instead of prepackaged foods. Buy fresh fruits and vegetables in-season from local farmers markets. Buy frozen fruits and vegetables in resealable bags. If you do not have access to quality fresh seafood, buy precooked frozen shrimp or canned fish, such as tuna, salmon, or sardines. Buy small amounts of raw or cooked vegetables, salads, or olives from the deli or salad bar at your store. Stock your pantry so you always have certain foods on hand, such as olive oil, canned tuna, canned tomatoes, rice, pasta, and beans. Cooking  Cook foods with extra-virgin olive oil instead of using butter or other vegetable oils. Have meat as a side dish, and have vegetables or grains as your main dish. This means having meat in small portions or adding small amounts of meat to foods like pasta or stew. Use beans or vegetables instead of meat in common dishes like chili or lasagna. Experiment with different cooking methods. Try roasting or broiling vegetables instead of steaming or sauteing them. Add frozen vegetables to soups, stews, pasta, or rice. Add nuts or seeds for added healthy fat at each meal. You can add these to yogurt, salads, or vegetable dishes. Marinate fish or vegetables using olive oil, lemon juice, garlic, and fresh herbs. Meal planning  Plan to eat 1  vegetarian meal one day each week. Try to work up to 2 vegetarian meals, if possible. Eat seafood 2 or more times a week. Have healthy snacks readily available, such as: Vegetable sticks with hummus. Greek yogurt. Fruit and nut trail mix. Eat balanced meals throughout the week. This includes: Fruit: 2-3 servings a day Vegetables: 4-5 servings a day Low-fat dairy: 2 servings a day Fish, poultry, or lean meat: 1 serving a day Beans and legumes: 2 or more servings a week Nuts and seeds: 1-2 servings a day Whole grains: 6-8 servings a  day Extra-virgin olive oil: 3-4 servings a day Limit red meat and sweets to only a few servings a month What are my food choices? Mediterranean diet Recommended Grains: Whole-grain pasta. Brown rice. Bulgar wheat. Polenta. Couscous. Whole-wheat bread. Orpah Cobb. Vegetables: Artichokes. Beets. Broccoli. Cabbage. Carrots. Eggplant. Green beans. Chard. Kale. Spinach. Onions. Leeks. Peas. Squash. Tomatoes. Peppers. Radishes. Fruits: Apples. Apricots. Avocado. Berries. Bananas. Cherries. Dates. Figs. Grapes. Lemons. Melon. Oranges. Peaches. Plums. Pomegranate. Meats and other protein foods: Beans. Almonds. Sunflower seeds. Pine nuts. Peanuts. Cod. Salmon. Scallops. Shrimp. Tuna. Tilapia. Clams. Oysters. Eggs. Dairy: Low-fat milk. Cheese. Greek yogurt. Beverages: Water. Red wine. Herbal tea. Fats and oils: Extra virgin olive oil. Avocado oil. Grape seed oil. Sweets and desserts: [place] yogurt with honey. Baked apples. Poached pears. Trail mix. Seasoning and other foods: Basil. Cilantro. Coriander. Cumin. Mint. Parsley. Sage. Rosemary. Tarragon. Garlic. Oregano. Thyme. Pepper. Balsalmic vinegar. Tahini. Hummus. Tomato sauce. Olives. Mushrooms. Limit these Grains: Prepackaged pasta or rice dishes. Prepackaged cereal with added sugar. Vegetables: Deep fried potatoes (french fries). Fruits: Fruit canned in syrup. Meats and other protein foods: Beef. Pork. Lamb. Poultry with skin. Hot dogs. Tomasa Blase. Dairy: Ice cream. Sour cream. Whole milk. Beverages: Juice. Sugar-sweetened soft drinks. Beer. Liquor and spirits. Fats and oils: Butter. Canola oil. Vegetable oil. Beef fat (tallow). Lard. Sweets and desserts: Cookies. Cakes. Pies. Candy. Seasoning and other foods: Mayonnaise. Premade sauces and marinades. The items listed may not be a complete list. Talk with your dietitian about what dietary choices are right for you. Summary The Mediterranean diet includes both food and lifestyle choices. Eat a  variety of fresh fruits and vegetables, beans, nuts, seeds, and whole grains. Limit the amount of red meat and sweets that you eat. Talk with your health care provider about whether it is safe for you to drink red wine in moderation. This means 1 glass a day for nonpregnant women and 2 glasses a day for men. A glass of wine equals 5 oz (150 mL). This information is not intended to replace advice given to you by your health care provider. Make sure you discuss any questions you have with your health care provider. Document Released: 03/31/2016 Document Revised: 05/03/2016 Document Reviewed: 03/31/2016 Elsevier Interactive Patient Education  2017 [employer].

## 2023-01-27 NOTE — Progress Notes (Addendum)
Assessment/Plan:    Subjective Memory Complaints, likely multifactorial  Debra Jordan is a very pleasant 72 y.o. RH femalee with a history of possible ADD, mild dyslexia, arthritis, gout, CKD status post left nephrectomy, fibromyalgia with chronic pain, PCOS, hypothyroidism,  history of seizures in the past, history of hypertension, hyperlipidemia, DM2 with diabetic neuropathy, anxiety, depression and strong family history of dementia  and strong family history of Alzheimer's disease with recent neuropsychological evaluation her performance to be within normal limits, not compelling neurocognitive evidence to suggest underlying neurological cause presenting today in follow-up for evaluation of memory loss.  LP  10/28/2022 was negative for Alzheimer's disease.  The patient is on donepezil 5 mg daily however, given her LP and Neuropsych results, this medicine is no longer indicated.      Recommendations:   Follow up as needed Discontinue donepezil 5 mg daily as no neurodegenerative disease is noted during her performance of the Neuropsych evaluation. Continue behavioral health for management of depression and possible ADD-ADHD recommend good control of cardiovascular risk factors Continue to control mood as per PCP    Subjective:   This patient is here alone. Previous records as well as any outside records available were reviewed prior to todays visit.      Any changes in memory since last visit? "I am doing well, my memory is stable, I am happy I don't have Alzheimer's". She moved to a new place, and is building new relationships. She enjoys doing crossword puzzles and word finding.  repeats oneself?  Endorsed Disoriented when walking into a room?  Patient denies except occasionally not remembering what patient came to the room for    Leaving objects in unusual places?  Patient denies   Wandering behavior?   denies   Any personality changes since last visit?   denies   Any worsening  depression?: denies   Hallucinations or paranoia?  denies   Seizures?   denies    Any sleep changes?  Denies  vivid dreams, REM behavior or sleepwalking   Sleep apnea?   denies   Any hygiene concerns?   denies   Independent of bathing and dressing?  Endorsed  Does the patient needs help with medications?  Patient is in charge   Who is in charge of the finances?   Patient is in charge     Any changes in appetite?  denies     Patient have trouble swallowing?  denies   Does the patient cook?  Any kitchen accidents such as leaving the stove on?   denies   Any headaches?    denies   Vision changes? denies Chronic back pain  denies   Ambulates with difficulty?  She uses a walker after a recent fall for stability  Recent falls or head injuries?    denies     Unilateral weakness, numbness or tingling?   denies   Any tremors?  denies   Any anosmia?    denies   Any incontinence of urine?  denies   Any bowel dysfunction?  denies      Patient lives  with her disabled daughter  Does the patient drive?yes   Neuropsychological evaluation 11/17/2022 briefly, results suggested neuropsychological functioning largely within normal limits relative to age-matched peers. An isolated weakness was exhibited across basic attention. However, performances across complex attention were appropriate. Another isolated weakness was exhibited across a story-based delayed recall task. However, she still retained 79% of previously learned information and consistently performed very well  across all aspects of three other memory tasks. The etiology for subjective dysfunction is likely multifactorial in nature. Across a comprehensive personality questionnaire, her responses suggested that she has an unusually high degree of concern surrounding physical functioning and health matters, with probable impairment surrounding somatic symptoms. Physical complaints likely include numerous biological systems, including neurological,  gastrointestinal, and musculoskeletal systems. Her overall item endorsement pattern aligns with both conversion and somatization disorders. Outside of somatic complaints, responses also suggested transient depressive symptoms and the potential for her change in functional status to directly impact her overall mood. Overall, concerns for an underlying somatic symptom disorder, coupled with her history of ADHD, fibromyalgia and chronic pain, psychiatric distress, sleep dysfunction, and medical ailments/cerebrovascular changes all can create Debra Jordan' subjective experience, especially when combined. This combination represents the most likely cause for subjective dysfunction at the present time. There is not compelling neurocognitive evidence to suggest an underlying neurological cause.     Initial visit 08/12/22   How long did patient have memory difficulties?  For about 6 years.  Patient has been initially seen by [facility] in 2019, with memory and personality change.  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  In 2019, she was having difficulty learning new tasks, and was terminated because she was having trouble adapting to the new processes.  This was accompanied by increasing crying spells aggravated by aggressive behaviors, which she reported being out of character for her.  She was last seen at [facility] on 04/27/2021 with same complaints, accompanied by more near syncope and syncopal episodes as well as increasing stress.  At the time, her MMSE was 29.  However she reports that there are some days that she does not remember entire day, or the names of her grandchildren great-grandchildren.  At times, she does not remember if she took the medication (for example today she forgot to take her blood pressure medication), and she also has difficulty planning, forgetting doctors appointments if she  does not write them down.  She does admit to losing staff.  "ADD do not help, but I am easily distracted the brain never shuts of ".  According to her, "my daughter is frustrated, having difficulty going through with the conversation " repeats oneself?  Endorsed "all the time, worse since Covid July 2020 and July 2021 ".  Disoriented when walking into a room?  Patient denies except occasionally not remembering what patient came to the room for   Leaving objects in unusual places?  Endorsed "I lose objects easy ".   Wandering behavior?  Patient denies   Any personality changes since last visit?  "I have more irritated than before, I can yell and get angry easier " Any worsening depression?:  Patient denies   Hallucinations or paranoia?  "I do not and I do not ".  "I would see my dead mom after her there is although I knew that she was dead, I felt I was seeing her ". Seizures?   Remote,, however she has episodes of "gray out "where she can slam into a wall.  This happened 5 times last about 2-3 months ago. Any sleep changes?  I can sleep 80 percent of the day and still  sleep at night, rarely having dreams, denies REM behavior or sleepwalking. Sleep apnea?  Patient denies   Any hygiene concerns? Forget taking showers, longest  was 2 weeks with taking one and did not realized  Independent of bathing and dressing?  Endorsed  Does the patient needs help with medications?  Patient is in charge Who is in charge of the finances?  Patient  is in charge . Any changes in appetite? Endorsed," I crave Ice Cream every day for the last 2 years" Patient have trouble swallowing? All the time, "I have narrow esophagus, I had some studies done over the last 6 years ". Does the patient cook?  Endorsed Any kitchen accidents such as leaving the stove on? Patient denies, but I use the timer all the time.  Any headaches?  Patient denies   Chronic back pain  Endorsed, severe lower back, especially when bending. Went to a  Adult nurse, which helped.  Ambulates with difficulty?   Endorsed, no more than a few minutes, because of the chronic pain due to arthritis, neuropathy and possibly to fibromyalgia, especially on cold days and rainy days. Recent falls or head injuries?  "3 months ago I had a concussion, without loss of consciousness.  She had another mechanical fall with concussion and loss of consciousness about 1 year ago.  "She started gabapentin 3 times a day recently. Unilateral weakness, numbness or tingling?  She has chronic neuropathy, in the feet and the legs. She thinks she "may have had mini strokes when I had a crying spells and I wound up getting fired because I was calling people names and lashing "  Any tremors?  Endorsed, "I can be fine and some days can do when I am trying to carry something especially on the L hand, is more difficult to " Any anosmia?  Patient denies   Any incontinence of urine? Endorsed, since 1975  Any bowel dysfunction?   worse since Covid  Patient lives she lives with her daughter who has special needs " when I was 2 months pregnant I had meningitis and it affected my daughter ".    History of heavy alcohol intake?  Patient denies   History of heavy tobacco use?  Patient denies   Family history of dementia?  Patient reports that there was intermarriage in her family "so the cousins had dementia (8 of them), she also has 2 aunts form the maternal and two of the paternal side with Alzheimer's disease   The patient drives?  Endorsed, sometimes she forgets where she is driving, even in known roads, even on the known roads. She is an Airline pilot, Landscape architect, and she also has a degree in management, all of these degrees obtained after age 58.  Prior to that she had associates degree in accounting from community college.   Pertinent labs October 2023 total cholesterol 217, c-Met normal A1c 5.7, MCV 79.9  Past Medical History:  Diagnosis Date   ADHD  (attention deficit hyperactivity disorder)    Arthritis    gout   Bilateral shoulder pain 05/13/2022   Blue toe syndrome 10/06/2011   Broken shoulder    And tailbone    Bursitis    Hip and Shoulder per patient   Cataract    Mixed form OU   Chronic fatigue, unspecified 03/03/2019   Chronic kidney disease    hx of left nephrectomy   Diabetic retinopathy    NPDR [facility]   Dyspnea    Since having COVID   Elevated troponin 09/17/2021   Epigastric pain 09/17/2021   Family history of Alzheimer's disease 10/12/2022   Fatty liver    Fibromyalgia  Gallstones without obstruction of gallbladder 10/04/2021   Gout 09/06/2011   History of COVID-19 03/03/2019   History of headache    History of kidney stones    History of nephrectomy, unilateral 09/06/2011   History of PCOS    History of skin cancer    Skin Cancer   History of venous thrombosis and embolism    Left great toe   HTN (hypertension) 09/06/2011   Hypercalcemia 09/17/2021   Hyperlipidemia    Hypersomnolence    Hypertensive retinopathy    OU   Hypertriglyceridemia 09/06/2011   Hypothyroidism 09/06/2011   Ischemia of extremity 09/06/2011   Lacunar infarction    left periatrial white matter   Mass of finger of right hand 03/11/2019   Migraine headache 09/06/2011   Neuropathy    feet bilateral   Obesity    Osteoarthritis, hand 08/06/2020   Pain in right hip 08/22/1972   Pain in right shoulder 08/06/2020   Pain in toe of right foot 09/05/2011   Pancreatic cyst 10/04/2021   Pneumonia    Seizure disorder    Somatic symptom disorder 11/07/2022   Subjective memory complaints 11/07/2022   Trouble swallowing    Type II diabetes mellitus 09/06/2011     Past Surgical History:  Procedure Laterality Date   ABDOMINAL HYSTERECTOMY     CHOLECYSTECTOMY N/A 10/22/2021   Procedure: LAPAROSCOPIC CHOLECYSTECTOMY;  Surgeon: Fritzi Mandes, MD;  Location: [facility] OR;  Service: General;  Laterality: N/A;   HERNIA REPAIR     LYMPH NODE  BIOPSY     NASAL SINUS SURGERY     NEPHRECTOMY Left 2002   left nephrectomy   TONSILLECTOMY  1969   TUBAL LIGATION       PREVIOUS MEDICATIONS:   CURRENT MEDICATIONS:  Outpatient Encounter Medications as of 01/27/2023  Medication Sig   cyclobenzaprine (FLEXERIL) 10 MG tablet Take 10 mg by mouth at bedtime.   donepezil (ARICEPT) 5 MG tablet Take 1 tablet (5 mg total) by mouth daily.   insulin glargine, 2 Unit Dial, (TOUJEO MAX SOLOSTAR) 300 UNIT/ML Solostar Pen Inject 54 Units into the skin 2 (two) times daily.   insulin lispro (HUMALOG KWIKPEN) 200 UNIT/ML KwikPen Inject 10-25 Units into the skin 3 (three) times daily with meals as needed (high blood sugar).   labetalol (NORMODYNE) 100 MG tablet Take 100 mg by mouth 2 (two) times daily.   levothyroxine (SYNTHROID) 112 MCG tablet Take 112 mcg by mouth at bedtime.   losartan (COZAAR) 50 MG tablet Take 50 mg by mouth 2 (two) times daily.   naproxen sodium (ALEVE) 220 MG tablet Take 440 mg by mouth 2 (two) times daily as needed (pain).   No facility-administered encounter medications on file as of 01/27/2023.     Objective:     PHYSICAL EXAMINATION:    VITALS:   Vitals:   01/27/23 1510  BP: 120/80  Pulse: 78  Resp: 20  SpO2: 98%  Weight: 240 lb (108.9 kg)  Height: 5\' 4"  (1.626 m)    GEN:  The patient appears stated age and is in NAD. HEENT:  Normocephalic, atraumatic.   Neurological examination:  General: NAD, well-groomed, appears stated age. Orientation: The patient is alert. Oriented to person, place and date Cranial nerves: There is good facial symmetry.The speech is fluent and clear. No aphasia or dysarthria. Fund of knowledge is appropriate. Recent memory impaired and remote memory is normal.  Attention and concentration are normal.  Able to name objects and repeat phrases.  Hearing is intact to conversational tone     Sensation: Sensation is intact to light touch throughout Motor: Strength is at least antigravity  x4. DTR's 2/4 in UE/LE      08/15/2022   10:00 AM  Montreal Cognitive Assessment   Visuospatial/ Executive (0/5) 5  Naming (0/3) 3  Attention: Read list of digits (0/2) 2  Attention: Read list of letters (0/1) 1  Attention: Serial 7 subtraction starting at 100 (0/3) 3  Language: Repeat phrase (0/2) 2  Language : Fluency (0/1) 1  Abstraction (0/2) 2  Delayed Recall (0/5) 4  Orientation (0/6) 6  Total 29  Adjusted Score (based on education) 29       04/27/2021   12:54 PM 12/16/2019    1:19 PM 10/02/2018    3:47 PM  MMSE - Mini Mental State Exam  Orientation to time 5 4 5   Orientation to Place 5 4 4   Registration 3 3 3   Attention/ Calculation 5 5 4   Recall 2 3 3   Language- name 2 objects 2 2 2   Language- repeat 1 1 0  Language- follow 3 step command 3 3 3   Language- read & follow direction 1 1 1   Write a sentence 1 1 1   Copy design 1 1 1   Total score 29 28 27        Movement examination: Tone: There is normal tone in the UE/LE Abnormal movements:  no tremor.  No myoclonus.  No asterixis.   Coordination:  There is no decremation with RAM's. Normal finger to nose  Gait and Station: The patient has no difficulty arising out of a deep-seated chair without the use of the hands. The patient's stride length is good.  Gait is cautious and narrow.   Thank you for allowing [place] the opportunity to participate in the care of this nice patient. Please do not hesitate to contact us for any questions or concerns.   Total time spent on today's visit was 20 minutes dedicated to this patient today, preparing to see patient, examining the patient, ordering tests and/or medications and counseling the patient, documenting clinical information in the EHR or other health record, independently interpreting results and communicating results to the patient/family, discussing treatment and goals, answering patient's questions and coordinating care.  Cc:  Pcp, No  Marlowe Kays 01/27/2023 7:17 PM

## 2023-01-30 ENCOUNTER — Ambulatory Visit: Payer: Medicare Other | Admitting: Physical Therapy

## 2023-01-30 ENCOUNTER — Other Ambulatory Visit: Payer: Self-pay

## 2023-01-30 DIAGNOSIS — M6281 Muscle weakness (generalized): Secondary | ICD-10-CM

## 2023-01-30 DIAGNOSIS — R2689 Other abnormalities of gait and mobility: Secondary | ICD-10-CM

## 2023-01-30 DIAGNOSIS — M25551 Pain in right hip: Secondary | ICD-10-CM | POA: Diagnosis not present

## 2023-01-30 DIAGNOSIS — M25552 Pain in left hip: Secondary | ICD-10-CM

## 2023-01-30 DIAGNOSIS — Z9181 History of falling: Secondary | ICD-10-CM | POA: Diagnosis not present

## 2023-01-30 NOTE — Therapy (Signed)
OUTPATIENT PHYSICAL THERAPY EVALUATION   Patient Name: Debra Jordan MRN: [id number] DOB:05-17-1951, 72 y.o., female Today's Date: 01/30/2023  END OF SESSION:  PT End of Session - 01/30/23 0926     Visit Number 1    Number of Visits 4    Date for PT Re-Evaluation 02/27/23    Authorization Type [employer] Medicare    Progress Note Due on Visit 10    PT Start Time 0901    PT Stop Time 0925    PT Time Calculation (min) 24 min    Activity Tolerance Patient tolerated treatment well    Behavior During Therapy [facility] for tasks assessed/performed             Past Medical History:  Diagnosis Date   ADHD (attention deficit hyperactivity disorder)    Arthritis    gout   Bilateral shoulder pain 05/13/2022   Blue toe syndrome 10/06/2011   Broken shoulder    And tailbone    Bursitis    Hip and Shoulder per patient   Cataract    Mixed form OU   Chronic fatigue, unspecified 03/03/2019   Chronic kidney disease    hx of left nephrectomy   Diabetic retinopathy    NPDR OU   Dyspnea    Since having COVID   Elevated troponin 09/17/2021   Epigastric pain 09/17/2021   Family history of Alzheimer's disease 10/12/2022   Fatty liver    Fibromyalgia    Gallstones without obstruction of gallbladder 10/04/2021   Gout 09/06/2011   History of COVID-19 03/03/2019   History of headache    History of kidney stones    History of nephrectomy, unilateral 09/06/2011   History of PCOS    History of skin cancer    Skin Cancer   History of venous thrombosis and embolism    Left great toe   HTN (hypertension) 09/06/2011   Hypercalcemia 09/17/2021   Hyperlipidemia    Hypersomnolence    Hypertensive retinopathy    OU   Hypertriglyceridemia 09/06/2011   Hypothyroidism 09/06/2011   Ischemia of extremity 09/06/2011   Lacunar infarction    left periatrial white matter   Mass of finger of right hand 03/11/2019   Migraine headache 09/06/2011   Neuropathy    feet bilateral   Obesity     Osteoarthritis, hand 08/06/2020   Pain in right hip 08/22/1972   Pain in right shoulder 08/06/2020   Pain in toe of right foot 09/05/2011   Pancreatic cyst 10/04/2021   Pneumonia    Seizure disorder    Somatic symptom disorder 11/07/2022   Subjective memory complaints 11/07/2022   Trouble swallowing    Type II diabetes mellitus 09/06/2011   Past Surgical History:  Procedure Laterality Date   ABDOMINAL HYSTERECTOMY     CHOLECYSTECTOMY N/A 10/22/2021   Procedure: LAPAROSCOPIC CHOLECYSTECTOMY;  Surgeon: Fritzi Mandes, MD;  Location: [facility] OR;  Service: General;  Laterality: N/A;   HERNIA REPAIR     LYMPH NODE BIOPSY     NASAL SINUS SURGERY     NEPHRECTOMY Left 2002   left nephrectomy   TONSILLECTOMY  1969   TUBAL LIGATION     Patient Active Problem List   Diagnosis Date Noted   Subjective memory complaints 11/07/2022   Somatic symptom disorder 11/07/2022   Lacunar infarction    ADHD (attention deficit hyperactivity disorder) 11/04/2022   Chronic kidney disease 11/04/2022   Diabetic retinopathy 11/04/2022   Fibromyalgia 11/04/2022   History of COVID-19  Family history of Alzheimer's disease 10/12/2022   Bilateral shoulder pain 05/13/2022   Gallstones without obstruction of gallbladder 10/04/2021   Pancreatic cyst 10/04/2021   Epigastric pain 09/17/2021   Elevated troponin 09/17/2021   Hypercalcemia 09/17/2021   Pain in right shoulder 08/06/2020   Osteoarthritis, hand 08/06/2020   Mass of finger of right hand 03/11/2019   Chronic fatigue, unspecified 03/03/2019   Blue toe syndrome 10/06/2011   Type II diabetes mellitus 09/06/2011   HTN (hypertension) 09/06/2011   Hypertriglyceridemia 09/06/2011   Ischemia of extremity 09/06/2011   Hypothyroidism 09/06/2011   Gout 09/06/2011   Migraine headache 09/06/2011   History of nephrectomy, unilateral 09/06/2011   Pain in toe of right foot 09/05/2011   Pain in right hip 08/22/1972    PCP: no PCP in epic chart  review  REFERRING PROVIDER: Tarry Kos, MD  REFERRING DIAG: [phone] (ICD-10-CM) - Pain in left hip  THERAPY DIAG:  Pain of both hip joints - Plan: PT plan of care cert/re-cert  Muscle weakness (generalized) - Plan: PT plan of care cert/re-cert  Other abnormalities of gait and mobility - Plan: PT plan of care cert/re-cert  History of falling - Plan: PT plan of care cert/re-cert  Rationale for Evaluation and Treatment: Rehabilitation  ONSET DATE: 2 months ago  SUBJECTIVE:   SUBJECTIVE STATEMENT: Reports she had a fall 2 months ago falling on Lt side; and about a week ago she stumbled over a box and now her Rt hip is hurting as well.  PERTINENT HISTORY: ADHD, arthritis, DM with retinopathy, fibromyalgia, HTN, seizure disorcer, hx CVA, long COVID  PAIN:  NPRS scale: 8 currently, up to 10, at best 0/10 Pain location: Rt hip; (Lt hip occasionally) Pain description: constant ache Aggravating factors: standing, walking, moving wrong, side stepping Relieving factors: sitting, rest  PRECAUTIONS: None  WEIGHT BEARING RESTRICTIONS: No  FALLS:  Has patient fallen in last 6 months? Yes. Number of falls 2  LIVING ENVIRONMENT: Lives with: lives with their family (daughter that has amputation); pt provides moderate A for daughter Lives in: House/apartment Stairs: No Has following equipment at home: Environmental consultant - 2 wheeled  OCCUPATION: retired  PLOF: Independent  PATIENT GOALS: improve pain, ideas for exercises at home  Next MD visit: PRN   OBJECTIVE:   DIAGNOSTIC FINDINGS: xrays: OA  PATIENT SURVEYS:  01/30/2023 FOTO deferred as likely 1x eval  COGNITION: Overall cognitive status: WFL    SENSATION: WFL  MUSCLE LENGTH: 01/30/2023 Piriformis tightness bil  POSTURE:  rounded shoulders and forward head  PALPATION: 01/30/2023 TTP Rt glute min and around greater trochanger  LOWER EXTREMITY MMT:  MMT Right 01/30/2023 Left 01/30/2023  Hip flexion 4/5 4/5  Hip  abduction 3/5 3/5  Hip adduction 4/5 4/5  Knee extension 5/5 5/5   (Blank rows = not tested)  LOWER EXTREMITY SPECIAL TESTS:  01/30/2023 Hip special tests: Luisa Hart (FABER) test: negative  FUNCTIONAL TESTS:  01/30/2023 5x STS: 13.38 sec without UE support  GAIT: 01/30/2023 Distance walked: 100' throughout clinic Assistive device utilized: None Level of assistance: Modified independence Comments: antalgic gait  TODAY'S TREATMENT DATE: 01/30/2023 Therex:   HEP instruction/performance c cues for techniques, handout provided.  Trial set performed of each for comprehension and symptom assessment.  See below for exercise list  PATIENT EDUCATION:  01/30/2023 Education details: HEP, POC Person educated: Patient Education method: Explanation, Demonstration, Verbal cues, and Handouts Education comprehension: verbalized understanding, returned demonstration, and verbal cues required  HOME EXERCISE PROGRAM: Access Code: [email or web address] URL: [email or web address] Date: 01/30/2023 Prepared by: Moshe Cipro  Exercises - Sidelying Hip Circles  - 2 x daily - 7 x weekly - 1-2 sets - 10 reps - Supine Bridge  - 2 x daily - 7 x weekly - 1-2 sets - 10 reps - 5 sec hold - Sit to Stand  - 2 x daily - 7 x weekly - 1-2 sets - 10 reps - Supine Piriformis Stretch with Foot on Ground  - 2 x daily - 7 x weekly - 1 sets - 3 reps - 30 sec hold - Self Release   - 2-3 x daily - 7 x weekly - 1 sets - 1 reps - 3-5 min hold  ASSESSMENT:  CLINICAL IMPRESSION: Patient is a 72 y.o. who comes to clinic with complaints of bil hip pain with mobility, strength and movement coordination deficits that impair their ability to perform usual daily and recreational functional activities without increase difficulty/symptoms at this time.  Patient  to benefit from skilled PT services to address deficits listed, and pt requesting to hold on scheduling any additional appts at this time and will work on HEP.   OBJECTIVE IMPAIRMENTS: Abnormal gait, difficulty walking, decreased ROM, decreased strength, increased fascial restrictions, increased muscle spasms, and pain.   ACTIVITY LIMITATIONS: carrying, lifting, bending, sitting, standing, squatting, sleeping, transfers, bed mobility, locomotion level, and caring for others  PARTICIPATION LIMITATIONS: meal prep, cleaning, laundry, and community activity  PERSONAL FACTORS: 3+ comorbidities: ADHD, arthritis, DM with retinopathy, fibromyalgia, HTN, seizure disorcer, hx CVA, long COVID  are also affecting patient's functional outcome.   REHAB POTENTIAL: Good  CLINICAL DECISION MAKING: Stable/uncomplicated  EVALUATION COMPLEXITY: Low   GOALS: Goals reviewed with patient? Yes  LONG TERM GOALS: Target Date: 02/27/2023  1. To be determined if pt returns   PLAN:  PT FREQUENCY: 1x/week  PT DURATION: 4 weeks  PLANNED INTERVENTIONS: Therapeutic exercises, Therapeutic activity, Neuro Muscular re-education, Balance training, Gait training, Patient/Family education, Joint mobilization, Stair training, DME instructions, Dry Needling, Electrical stimulation, Traction, Cryotherapy, vasopneumatic deviceMoist heat, Taping, Ultrasound, Ionotophoresis 4mg /ml Dexamethasone, and aquatic therapy, Manual therapy.  All included unless contraindicated  PLAN FOR NEXT SESSION: Review HEP, reassess. D/C if pt doesn't return   Clarita Crane, PT, DPT 01/30/23 9:34 AM

## 2023-02-06 ENCOUNTER — Telehealth: Payer: Self-pay

## 2023-02-06 NOTE — Telephone Encounter (Signed)
-----   Message from Juanda Chance, NP sent at 02/06/2023  8:20 AM EDT ----- Can we have her come in for lumbar MRI review? Thanks ----- Message ----- From: Interface, Rad Results In Sent: 02/04/2023   9:17 AM EDT To: Juanda Chance, NP

## 2023-02-06 NOTE — Telephone Encounter (Signed)
Spoke with patient and scheduled OV for 02/08/23.  

## 2023-02-08 ENCOUNTER — Encounter: Payer: Self-pay | Admitting: Physical Medicine and Rehabilitation

## 2023-02-08 ENCOUNTER — Ambulatory Visit: Payer: Medicare Other | Admitting: Physical Medicine and Rehabilitation

## 2023-02-08 DIAGNOSIS — M47816 Spondylosis without myelopathy or radiculopathy, lumbar region: Secondary | ICD-10-CM | POA: Diagnosis not present

## 2023-02-08 DIAGNOSIS — M5416 Radiculopathy, lumbar region: Secondary | ICD-10-CM | POA: Diagnosis not present

## 2023-02-08 DIAGNOSIS — G894 Chronic pain syndrome: Secondary | ICD-10-CM | POA: Diagnosis not present

## 2023-02-08 DIAGNOSIS — M48062 Spinal stenosis, lumbar region with neurogenic claudication: Secondary | ICD-10-CM | POA: Diagnosis not present

## 2023-02-08 DIAGNOSIS — M1612 Unilateral primary osteoarthritis, left hip: Secondary | ICD-10-CM

## 2023-02-08 NOTE — Progress Notes (Signed)
Debra Jordan - 72 y.o. female MRN [id number]  Date of birth: 11-01-1950  Office Visit Note: Visit Date: 02/08/2023 PCP: Pcp, No Referred by: No ref. provider found  Subjective: Chief Complaint  Patient presents with   Lower Back - Pain   HPI: Debra Jordan is a 72 y.o. female who comes in today for evaluation of chronic bilateral lower back pain radiating to left hip, groin and down anterior thigh. Lower back pain ongoing for several years, reports left sided hip and groin pain started after mechanical fall several weeks ago. Pain worsens with movement and activity. She describes pain as sore and burning sensation, currently rates as 1 out of 10. Some relief of pain with home exercise regimen, heating pad, rest and medications. She recently attended formal physical therapy evaluation where she developed home exercise regimen. Recent lumbar MRI imaging exhibits progressive spondylosis at L4-L5 where there is now moderate spinal canal stenosis and moderately severe to severe foraminal stenosis on the left. Patient was recently evaluated by Dr. Glee Arvin for left sided groin/hip pain. Recent left hip x-rays exhibits advanced degenerative joint disease. She was scheduled to see Dr. Madelyn Brunner for ultrasound guided left hip injection, however there was confusion with appointment and patient was unaware of scheduling. Overall, she is feeling much better at this time, states she has started directed home exercise regimen and is working on being more active. Currently being managed by Dr. Ambrose Finland with [facility], dose of Cymbalta was recently increased from 30 mg to 60 mg daily. Patient denies focal weakness, numbness and tingling. No recent trauma or falls.   Patient does carry diagnosis of fibromyalgia, chronic pain, somatic symptom disorder, diabetes mellitus and chronic migraines.    Review of Systems  Musculoskeletal:  Positive for back pain and myalgias.  Neurological:   Negative for tingling, sensory change, focal weakness and weakness.  All other systems reviewed and are negative.  Otherwise per HPI.  Assessment & Plan: Visit Diagnoses:    ICD-10-CM   1. Lumbar radiculopathy  M54.16     2. Spinal stenosis of lumbar region with neurogenic claudication  M48.062     3. Facet arthropathy, lumbar  M47.816     4. Chronic pain syndrome  G89.4     5. Primary osteoarthritis of left hip  M16.12        Plan: Findings:  1. Chronic bilateral lower back pain radiating down left anterior thigh. Patient doing well managing her pain at this time. She continues with conservative therapies as needed. Also continues with directed home exercise program. She reports significant relief of pain with increased dose of Cymbalta. Patients clinical presentation and exam are consistent with neurogenic claudication as a result of spinal canal stenosis. At this time, would not recommend interventional spine procedure as her pain is minimal.  I discussed treatment plan with patient today. If bilateral lower back pain persists we would consider performing interlaminar epidural steroid injection at the level of L4-L5 where there is moderate spinal canal stenosis.   2. Left sided groin/hip pain. Severe DJD on recent left hip x-rays. If her pain seems to be more left groin/hip related would recommend referral back to Dr. Shon Baton for diagnostic ultrasound guided left hip injection, we can also look at performing hip injection if needed.   I do feel her fibromyalgia is working to exacerbate her symptoms. She does have long history of chronic pain and generalized myalgias. I encouraged her to continue working with  rheumatology and to contact us if her pain persists. No red flag symptoms noted upon exam today.      Meds & Orders: No orders of the defined types were placed in this encounter.  No orders of the defined types were placed in this encounter.   Follow-up: Return if symptoms worsen  or fail to improve.   Procedures: No procedures performed      Clinical History: CLINICAL DATA:  Chronic low back and bilateral hip pain, worse over the past 3 months.   EXAM: MRI LUMBAR SPINE WITHOUT CONTRAST   TECHNIQUE: Multiplanar, multisequence MR imaging of the lumbar spine was performed. No intravenous contrast was administered.   COMPARISON:  Plain films lumbar spine 10/28/2022. MRI lumbar spine 02/24/2018. MRI of the abdomen 10/01/2022.   FINDINGS: Segmentation:  Standard.   Alignment: Trace retrolisthesis at L2-3, L3-4 and L4-5 is unchanged.   Vertebrae: No fracture, evidence of discitis, or bone lesion. Small Schmorl's node in the inferior endplate of L2 and degenerative endplate signal change at L2-3 again seen.   Conus medullaris and cauda equina: Conus extends to the L2-3 level. Conus and cauda equina appear normal.   Paraspinal and other soft tissues: Small proteinaceous cyst right kidney is unchanged since the prior abdomen MRI.   Disc levels:   T11-12 is imaged in the sagittal plane only. There is a left paracentral and foraminal protrusion which appears to cause moderate foraminal stenosis. The central canal and right foramen appear open.   T12-L1: Negative.   L1-2: Negative.   L2-3: Negative.   L3-4: Shallow disc bulge without stenosis.   L4-5: The patient has a broad-based disc bulge and a left foraminal protrusion. There is also a new right foraminal protrusion. Moderate central canal stenosis and moderately severe to severe foraminal narrowing, worse on the left have progressed since the prior MRI.   L5-S1: Advanced bilateral facet degenerative disease. Otherwise negative   IMPRESSION: 1. Progressive spondylosis at L4-5 where there is now moderate central canal stenosis and moderately severe to severe foraminal narrowing, worse on the left. 2. Left paracentral and foraminal protrusion at T11-12 appears to cause moderate foraminal  stenosis.     Electronically Signed   By: Drusilla Kanner M.D.   On: 02/04/2023 09:15   She reports that she has never smoked. She has never used smokeless tobacco. No results for input(s): "HGBA1C", "LABURIC" in the last 8760 hours.  Objective:  VS:  HT:    WT:   BMI:     BP:   HR: bpm  TEMP: ( )  RESP:  Physical Exam Vitals and nursing note reviewed.  HENT:     Head: Normocephalic and atraumatic.     Right Ear: External ear normal.     Left Ear: External ear normal.     Nose: Nose normal.     Mouth/Throat:     Mouth: Mucous membranes are moist.  Eyes:     Extraocular Movements: Extraocular movements intact.  Cardiovascular:     Rate and Rhythm: Normal rate.     Pulses: Normal pulses.  Pulmonary:     Effort: Pulmonary effort is normal.  Abdominal:     General: Abdomen is flat. There is no distension.  Musculoskeletal:        General: Tenderness present.     Cervical back: Normal range of motion.     Comments: Patient rises from seated position to standing without difficulty. Good lumbar range of motion. No pain noted with facet  loading. 5/5 strength noted with bilateral hip flexion, knee flexion/extension, ankle dorsiflexion/plantarflexion and EHL. No clonus noted bilaterally. No pain upon palpation of greater trochanters. No pain with internal/external rotation of bilateral hips. Sensation intact bilaterally. Dysesthesias noted to left L4 dermatome. Negative slump test bilaterally. Ambulates without aid, gait steady.       Skin:    General: Skin is warm and dry.     Capillary Refill: Capillary refill takes less than 2 seconds.  Neurological:     General: No focal deficit present.     Mental Status: She is alert and oriented to person, place, and time.  Psychiatric:        Mood and Affect: Mood normal.        Behavior: Behavior normal.     Ortho Exam  Imaging: No results found.  Past Medical/Family/Surgical/Social History: Medications & Allergies reviewed  per EMR, new medications updated. Patient Active Problem List   Diagnosis Date Noted   Subjective memory complaints 11/07/2022   Somatic symptom disorder 11/07/2022   Lacunar infarction    ADHD (attention deficit hyperactivity disorder) 11/04/2022   Chronic kidney disease 11/04/2022   Diabetic retinopathy 11/04/2022   Fibromyalgia 11/04/2022   History of COVID-19    Family history of Alzheimer's disease 10/12/2022   Bilateral shoulder pain 05/13/2022   Gallstones without obstruction of gallbladder 10/04/2021   Pancreatic cyst 10/04/2021   Epigastric pain 09/17/2021   Elevated troponin 09/17/2021   Hypercalcemia 09/17/2021   Pain in right shoulder 08/06/2020   Osteoarthritis, hand 08/06/2020   Mass of finger of right hand 03/11/2019   Chronic fatigue, unspecified 03/03/2019   Blue toe syndrome 10/06/2011   Type II diabetes mellitus 09/06/2011   HTN (hypertension) 09/06/2011   Hypertriglyceridemia 09/06/2011   Ischemia of extremity 09/06/2011   Hypothyroidism 09/06/2011   Gout 09/06/2011   Migraine headache 09/06/2011   History of nephrectomy, unilateral 09/06/2011   Pain in toe of right foot 09/05/2011   Pain in right hip 08/22/1972   Past Medical History:  Diagnosis Date   ADHD (attention deficit hyperactivity disorder)    Arthritis    gout   Bilateral shoulder pain 05/13/2022   Blue toe syndrome 10/06/2011   Broken shoulder    And tailbone    Bursitis    Hip and Shoulder per patient   Cataract    Mixed form OU   Chronic fatigue, unspecified 03/03/2019   Chronic kidney disease    hx of left nephrectomy   Diabetic retinopathy    NPDR OU   Dyspnea    Since having COVID   Elevated troponin 09/17/2021   Epigastric pain 09/17/2021   Family history of Alzheimer's disease 10/12/2022   Fatty liver    Fibromyalgia    Gallstones without obstruction of gallbladder 10/04/2021   Gout 09/06/2011   History of COVID-19 03/03/2019   History of headache    History of  kidney stones    History of nephrectomy, unilateral 09/06/2011   History of PCOS    History of skin cancer    Skin Cancer   History of venous thrombosis and embolism    Left great toe   HTN (hypertension) 09/06/2011   Hypercalcemia 09/17/2021   Hyperlipidemia    Hypersomnolence    Hypertensive retinopathy    OU   Hypertriglyceridemia 09/06/2011   Hypothyroidism 09/06/2011   Ischemia of extremity 09/06/2011   Lacunar infarction    left periatrial white matter   Mass of finger of right hand 03/11/2019  Migraine headache 09/06/2011   Neuropathy    feet bilateral   Obesity    Osteoarthritis, hand 08/06/2020   Pain in right hip 08/22/1972   Pain in right shoulder 08/06/2020   Pain in toe of right foot 09/05/2011   Pancreatic cyst 10/04/2021   Pneumonia    Seizure disorder    Somatic symptom disorder 11/07/2022   Subjective memory complaints 11/07/2022   Trouble swallowing    Type II diabetes mellitus 09/06/2011   Family History  Problem Relation Age of Onset   Other Mother        blood clot history, varicose veins   Stroke Mother    Diabetes Mother    Cancer Father    Healthy Brother    Healthy Brother    Healthy Brother    Stroke Maternal Grandmother    Diabetes Daughter    Dementia Other    Alzheimer's disease Other        several family members stemming from intermarriage   Past Surgical History:  Procedure Laterality Date   ABDOMINAL HYSTERECTOMY     CHOLECYSTECTOMY N/A 10/22/2021   Procedure: LAPAROSCOPIC CHOLECYSTECTOMY;  Surgeon: Fritzi Mandes, MD;  Location: [facility] OR;  Service: General;  Laterality: N/A;   HERNIA REPAIR     LYMPH NODE BIOPSY     NASAL SINUS SURGERY     NEPHRECTOMY Left 2002   left nephrectomy   TONSILLECTOMY  1969   TUBAL LIGATION     Social History   Occupational History    Comment: Contour Engineer, maintenance (IT)) Payroll   Occupation: Retired  Tobacco Use   Smoking status: Never   Smokeless tobacco: Never  Vaping Use   Vaping Use: Never  used  Substance and Sexual Activity   Alcohol use: No   Drug use: No   Sexual activity: Not Currently    Birth control/protection: Post-menopausal

## 2023-02-08 NOTE — Progress Notes (Signed)
Functional Pain Scale - descriptive words and definitions  Uncomfortable (3)  Pain is present but can complete all ADL's/sleep is slightly affected and passive distraction only gives marginal relief. Mild range order  Average Pain 1  Lower back pain. MRI review 

## 2023-04-16 ENCOUNTER — Emergency Department (HOSPITAL_COMMUNITY): Payer: Medicare Other

## 2023-04-16 ENCOUNTER — Encounter (HOSPITAL_COMMUNITY): Payer: Self-pay

## 2023-04-16 ENCOUNTER — Other Ambulatory Visit: Payer: Self-pay

## 2023-04-16 ENCOUNTER — Inpatient Hospital Stay (HOSPITAL_COMMUNITY)
Admission: EM | Admit: 2023-04-16 | Discharge: 2023-05-23 | DRG: 233 | Disposition: E | Payer: Medicare Other | Attending: Thoracic Surgery (Cardiothoracic Vascular Surgery) | Admitting: Thoracic Surgery (Cardiothoracic Vascular Surgery)

## 2023-04-16 DIAGNOSIS — E8729 Other acidosis: Secondary | ICD-10-CM | POA: Diagnosis not present

## 2023-04-16 DIAGNOSIS — I214 Non-ST elevation (NSTEMI) myocardial infarction: Secondary | ICD-10-CM | POA: Diagnosis not present

## 2023-04-16 DIAGNOSIS — F909 Attention-deficit hyperactivity disorder, unspecified type: Secondary | ICD-10-CM | POA: Diagnosis present

## 2023-04-16 DIAGNOSIS — M160 Bilateral primary osteoarthritis of hip: Secondary | ICD-10-CM | POA: Diagnosis present

## 2023-04-16 DIAGNOSIS — Y92239 Unspecified place in hospital as the place of occurrence of the external cause: Secondary | ICD-10-CM | POA: Diagnosis not present

## 2023-04-16 DIAGNOSIS — R34 Anuria and oliguria: Secondary | ICD-10-CM | POA: Diagnosis not present

## 2023-04-16 DIAGNOSIS — Z905 Acquired absence of kidney: Secondary | ICD-10-CM

## 2023-04-16 DIAGNOSIS — I5032 Chronic diastolic (congestive) heart failure: Secondary | ICD-10-CM | POA: Diagnosis present

## 2023-04-16 DIAGNOSIS — M549 Dorsalgia, unspecified: Secondary | ICD-10-CM | POA: Diagnosis present

## 2023-04-16 DIAGNOSIS — R579 Shock, unspecified: Secondary | ICD-10-CM

## 2023-04-16 DIAGNOSIS — I4460 Unspecified fascicular block: Secondary | ICD-10-CM | POA: Diagnosis present

## 2023-04-16 DIAGNOSIS — Z7901 Long term (current) use of anticoagulants: Secondary | ICD-10-CM

## 2023-04-16 DIAGNOSIS — T466X5A Adverse effect of antihyperlipidemic and antiarteriosclerotic drugs, initial encounter: Secondary | ICD-10-CM | POA: Diagnosis present

## 2023-04-16 DIAGNOSIS — E781 Pure hyperglyceridemia: Secondary | ICD-10-CM | POA: Diagnosis present

## 2023-04-16 DIAGNOSIS — Z8673 Personal history of transient ischemic attack (TIA), and cerebral infarction without residual deficits: Secondary | ICD-10-CM

## 2023-04-16 DIAGNOSIS — F05 Delirium due to known physiological condition: Secondary | ICD-10-CM | POA: Diagnosis not present

## 2023-04-16 DIAGNOSIS — I444 Left anterior fascicular block: Secondary | ICD-10-CM | POA: Diagnosis present

## 2023-04-16 DIAGNOSIS — M797 Fibromyalgia: Secondary | ICD-10-CM | POA: Diagnosis present

## 2023-04-16 DIAGNOSIS — Z79890 Hormone replacement therapy (postmenopausal): Secondary | ICD-10-CM

## 2023-04-16 DIAGNOSIS — I130 Hypertensive heart and chronic kidney disease with heart failure and stage 1 through stage 4 chronic kidney disease, or unspecified chronic kidney disease: Secondary | ICD-10-CM | POA: Diagnosis present

## 2023-04-16 DIAGNOSIS — E113293 Type 2 diabetes mellitus with mild nonproliferative diabetic retinopathy without macular edema, bilateral: Secondary | ICD-10-CM | POA: Diagnosis present

## 2023-04-16 DIAGNOSIS — Z794 Long term (current) use of insulin: Secondary | ICD-10-CM

## 2023-04-16 DIAGNOSIS — Z79899 Other long term (current) drug therapy: Secondary | ICD-10-CM

## 2023-04-16 DIAGNOSIS — R001 Bradycardia, unspecified: Secondary | ICD-10-CM | POA: Diagnosis not present

## 2023-04-16 DIAGNOSIS — Z6841 Body Mass Index (BMI) 40.0 and over, adult: Secondary | ICD-10-CM

## 2023-04-16 DIAGNOSIS — Z87442 Personal history of urinary calculi: Secondary | ICD-10-CM

## 2023-04-16 DIAGNOSIS — R0902 Hypoxemia: Secondary | ICD-10-CM | POA: Diagnosis not present

## 2023-04-16 DIAGNOSIS — Z85828 Personal history of other malignant neoplasm of skin: Secondary | ICD-10-CM

## 2023-04-16 DIAGNOSIS — R0689 Other abnormalities of breathing: Secondary | ICD-10-CM | POA: Diagnosis not present

## 2023-04-16 DIAGNOSIS — E7800 Pure hypercholesterolemia, unspecified: Secondary | ICD-10-CM | POA: Diagnosis present

## 2023-04-16 DIAGNOSIS — G40909 Epilepsy, unspecified, not intractable, without status epilepticus: Secondary | ICD-10-CM | POA: Diagnosis present

## 2023-04-16 DIAGNOSIS — I739 Peripheral vascular disease, unspecified: Secondary | ICD-10-CM | POA: Diagnosis present

## 2023-04-16 DIAGNOSIS — Z9049 Acquired absence of other specified parts of digestive tract: Secondary | ICD-10-CM

## 2023-04-16 DIAGNOSIS — N1832 Chronic kidney disease, stage 3b: Secondary | ICD-10-CM | POA: Diagnosis present

## 2023-04-16 DIAGNOSIS — Z713 Dietary counseling and surveillance: Secondary | ICD-10-CM

## 2023-04-16 DIAGNOSIS — Z8616 Personal history of COVID-19: Secondary | ICD-10-CM

## 2023-04-16 DIAGNOSIS — T3995XA Adverse effect of unspecified nonopioid analgesic, antipyretic and antirheumatic, initial encounter: Secondary | ICD-10-CM | POA: Diagnosis not present

## 2023-04-16 DIAGNOSIS — G720 Drug-induced myopathy: Secondary | ICD-10-CM | POA: Diagnosis present

## 2023-04-16 DIAGNOSIS — Z91040 Latex allergy status: Secondary | ICD-10-CM

## 2023-04-16 DIAGNOSIS — R079 Chest pain, unspecified: Principal | ICD-10-CM

## 2023-04-16 DIAGNOSIS — M109 Gout, unspecified: Secondary | ICD-10-CM | POA: Diagnosis present

## 2023-04-16 DIAGNOSIS — G8929 Other chronic pain: Secondary | ICD-10-CM | POA: Diagnosis present

## 2023-04-16 DIAGNOSIS — D696 Thrombocytopenia, unspecified: Secondary | ICD-10-CM | POA: Diagnosis not present

## 2023-04-16 DIAGNOSIS — Z880 Allergy status to penicillin: Secondary | ICD-10-CM

## 2023-04-16 DIAGNOSIS — E282 Polycystic ovarian syndrome: Secondary | ICD-10-CM | POA: Diagnosis present

## 2023-04-16 DIAGNOSIS — M542 Cervicalgia: Secondary | ICD-10-CM | POA: Diagnosis present

## 2023-04-16 DIAGNOSIS — K760 Fatty (change of) liver, not elsewhere classified: Secondary | ICD-10-CM | POA: Diagnosis present

## 2023-04-16 DIAGNOSIS — I200 Unstable angina: Secondary | ICD-10-CM | POA: Diagnosis not present

## 2023-04-16 DIAGNOSIS — Z90710 Acquired absence of both cervix and uterus: Secondary | ICD-10-CM

## 2023-04-16 DIAGNOSIS — R5381 Other malaise: Secondary | ICD-10-CM | POA: Diagnosis not present

## 2023-04-16 DIAGNOSIS — I469 Cardiac arrest, cause unspecified: Secondary | ICD-10-CM | POA: Diagnosis not present

## 2023-04-16 DIAGNOSIS — E1142 Type 2 diabetes mellitus with diabetic polyneuropathy: Secondary | ICD-10-CM | POA: Diagnosis present

## 2023-04-16 DIAGNOSIS — G9341 Metabolic encephalopathy: Secondary | ICD-10-CM | POA: Diagnosis not present

## 2023-04-16 DIAGNOSIS — Z888 Allergy status to other drugs, medicaments and biological substances status: Secondary | ICD-10-CM

## 2023-04-16 DIAGNOSIS — I071 Rheumatic tricuspid insufficiency: Secondary | ICD-10-CM | POA: Diagnosis present

## 2023-04-16 DIAGNOSIS — I25110 Atherosclerotic heart disease of native coronary artery with unstable angina pectoris: Secondary | ICD-10-CM | POA: Diagnosis present

## 2023-04-16 DIAGNOSIS — G4710 Hypersomnia, unspecified: Secondary | ICD-10-CM | POA: Diagnosis not present

## 2023-04-16 DIAGNOSIS — Z951 Presence of aortocoronary bypass graft: Secondary | ICD-10-CM

## 2023-04-16 DIAGNOSIS — Z86718 Personal history of other venous thrombosis and embolism: Secondary | ICD-10-CM

## 2023-04-16 DIAGNOSIS — E039 Hypothyroidism, unspecified: Secondary | ICD-10-CM | POA: Diagnosis present

## 2023-04-16 DIAGNOSIS — N179 Acute kidney failure, unspecified: Secondary | ICD-10-CM | POA: Diagnosis not present

## 2023-04-16 DIAGNOSIS — E1122 Type 2 diabetes mellitus with diabetic chronic kidney disease: Secondary | ICD-10-CM | POA: Diagnosis present

## 2023-04-16 DIAGNOSIS — Z820 Family history of epilepsy and other diseases of the nervous system: Secondary | ICD-10-CM

## 2023-04-16 DIAGNOSIS — T428X5A Adverse effect of antiparkinsonism drugs and other central muscle-tone depressants, initial encounter: Secondary | ICD-10-CM | POA: Diagnosis not present

## 2023-04-16 DIAGNOSIS — Z833 Family history of diabetes mellitus: Secondary | ICD-10-CM

## 2023-04-16 DIAGNOSIS — Z823 Family history of stroke: Secondary | ICD-10-CM

## 2023-04-16 DIAGNOSIS — R1310 Dysphagia, unspecified: Secondary | ICD-10-CM | POA: Diagnosis not present

## 2023-04-16 LAB — GLUCOSE, CAPILLARY
Glucose-Capillary: 147 mg/dL — ABNORMAL HIGH (ref 70–99)
Glucose-Capillary: 128 mg/dL — ABNORMAL HIGH (ref 70–99)

## 2023-04-16 LAB — COMPREHENSIVE METABOLIC PANEL
ALT: 21 U/L (ref 0–44)
AST: 24 U/L (ref 15–41)
Albumin: 3.7 g/dL (ref 3.5–5.0)
Alkaline Phosphatase: 57 U/L (ref 38–126)
Anion gap: 12 (ref 5–15)
BUN: 18 mg/dL (ref 8–23)
CO2: 26 mmol/L (ref 22–32)
Calcium: 9.4 mg/dL (ref 8.9–10.3)
Chloride: 102 mmol/L (ref 98–111)
Creatinine, Ser: 1.48 mg/dL — ABNORMAL HIGH (ref 0.44–1.00)
GFR, Estimated: 38 mL/min — ABNORMAL LOW (ref >60)
Glucose, Bld: 208 mg/dL — ABNORMAL HIGH (ref 70–99)
Potassium: 4.5 mmol/L (ref 3.5–5.1)
Sodium: 140 mmol/L (ref 135–145)
Total Bilirubin: 0.7 mg/dL (ref 0.3–1.2)
Total Protein: 6.7 g/dL (ref 6.5–8.1)

## 2023-04-16 LAB — TROPONIN I (HIGH SENSITIVITY)
Troponin I (High Sensitivity): 77 ng/L — ABNORMAL HIGH (ref <18)
Troponin I (High Sensitivity): 84 ng/L — ABNORMAL HIGH (ref <18)

## 2023-04-16 LAB — HEPARIN LEVEL (UNFRACTIONATED): Heparin Unfractionated: 0.33 [IU]/mL (ref 0.30–0.70)

## 2023-04-16 LAB — LIPID PANEL
Cholesterol: 215 mg/dL — ABNORMAL HIGH (ref 0–200)
HDL: 47 mg/dL (ref >40)
LDL Cholesterol: 127 mg/dL — ABNORMAL HIGH (ref 0–99)
Total CHOL/HDL Ratio: 4.6 RATIO
Triglycerides: 204 mg/dL — ABNORMAL HIGH (ref <150)
VLDL: 41 mg/dL — ABNORMAL HIGH (ref 0–40)

## 2023-04-16 LAB — HEMOGLOBIN A1C
Hgb A1c MFr Bld: 6.8 % — ABNORMAL HIGH (ref 4.8–5.6)
Mean Plasma Glucose: 148.46 mg/dL

## 2023-04-16 LAB — CBC WITH DIFFERENTIAL/PLATELET
Abs Immature Granulocytes: 0.02 10*3/uL (ref 0.00–0.07)
Basophils Absolute: 0.1 10*3/uL (ref 0.0–0.1)
Basophils Relative: 1 %
Eosinophils Absolute: 0.2 10*3/uL (ref 0.0–0.5)
Eosinophils Relative: 2 %
HCT: 41.1 % (ref 36.0–46.0)
Hemoglobin: 12.9 g/dL (ref 12.0–15.0)
Immature Granulocytes: 0 %
Lymphocytes Relative: 24 %
Lymphs Abs: 1.8 10*3/uL (ref 0.7–4.0)
MCH: 27.3 pg (ref 26.0–34.0)
MCHC: 31.4 g/dL (ref 30.0–36.0)
MCV: 86.9 fL (ref 80.0–100.0)
Monocytes Absolute: 0.5 10*3/uL (ref 0.1–1.0)
Monocytes Relative: 7 %
Neutro Abs: 5.1 10*3/uL (ref 1.7–7.7)
Neutrophils Relative %: 66 %
Platelets: 216 10*3/uL (ref 150–400)
RBC: 4.73 MIL/uL (ref 3.87–5.11)
RDW: 14.6 % (ref 11.5–15.5)
WBC: 7.7 10*3/uL (ref 4.0–10.5)
nRBC: 0 % (ref 0.0–0.2)

## 2023-04-16 LAB — LIPASE, BLOOD: Lipase: 30 U/L (ref 11–51)

## 2023-04-16 LAB — TSH: TSH: 7.571 u[IU]/mL — ABNORMAL HIGH (ref 0.350–4.500)

## 2023-04-16 LAB — HIV ANTIBODY (ROUTINE TESTING W REFLEX): HIV Screen 4th Generation wRfx: NONREACTIVE

## 2023-04-16 MED ORDER — INSULIN LISPRO 200 UNIT/ML ~~LOC~~ SOPN: 10.0000 [IU] | PEN_INJECTOR | Freq: Three times a day (TID) | SUBCUTANEOUS | Status: DC | PRN

## 2023-04-16 MED ORDER — ACETAMINOPHEN 325 MG PO TABS: 650.0000 mg | ORAL_TABLET | ORAL | Status: DC | PRN

## 2023-04-16 MED ORDER — CYCLOBENZAPRINE HCL 10 MG PO TABS
10.0000 mg | ORAL_TABLET | Freq: Every day | ORAL | Status: DC
  Administered 2023-04-16 – 2023-04-18 (×3): 10 mg via ORAL
  Filled 2023-04-16 (×3): qty 1

## 2023-04-16 MED ORDER — ONDANSETRON HCL 4 MG/2ML IJ SOLN: 4.0000 mg | Freq: Four times a day (QID) | INTRAMUSCULAR | Status: DC | PRN

## 2023-04-16 MED ORDER — LABETALOL HCL 200 MG PO TABS: 100.0000 mg | ORAL_TABLET | Freq: Two times a day (BID) | ORAL | Status: DC

## 2023-04-16 MED ORDER — LEVOTHYROXINE SODIUM 112 MCG PO TABS
112.0000 ug | ORAL_TABLET | Freq: Every day | ORAL | Status: DC
  Administered 2023-04-17 – 2023-04-19 (×3): 112 ug via ORAL
  Filled 2023-04-16 (×3): qty 1

## 2023-04-16 MED ORDER — HEPARIN (PORCINE) 25000 UT/250ML-% IV SOLN
1000.0000 [IU]/h | INTRAVENOUS | Status: DC
  Administered 2023-04-16 – 2023-04-17 (×2): 1000 [IU]/h via INTRAVENOUS
  Filled 2023-04-16 (×2): qty 250

## 2023-04-16 MED ORDER — DONEPEZIL HCL 5 MG PO TABS
5.0000 mg | ORAL_TABLET | Freq: Every day | ORAL | Status: DC
  Administered 2023-04-18: 5 mg via ORAL
  Filled 2023-04-16: qty 1

## 2023-04-16 MED ORDER — INSULIN GLARGINE-YFGN 100 UNIT/ML ~~LOC~~ SOLN
54.0000 [IU] | Freq: Two times a day (BID) | SUBCUTANEOUS | Status: DC
  Administered 2023-04-16: 54 [IU] via SUBCUTANEOUS
  Filled 2023-04-16 (×3): qty 0.54

## 2023-04-16 MED ORDER — LOSARTAN POTASSIUM 50 MG PO TABS
50.0000 mg | ORAL_TABLET | Freq: Two times a day (BID) | ORAL | Status: DC
  Administered 2023-04-16 – 2023-04-18 (×4): 50 mg via ORAL
  Filled 2023-04-16 (×4): qty 1

## 2023-04-16 MED ORDER — ASPIRIN 325 MG PO TABS
325.0000 mg | ORAL_TABLET | Freq: Every day | ORAL | Status: DC
  Administered 2023-04-16: 325 mg via ORAL
  Filled 2023-04-16: qty 1

## 2023-04-16 MED ORDER — SODIUM CHLORIDE 0.9% FLUSH
3.0000 mL | Freq: Two times a day (BID) | INTRAVENOUS | Status: DC
  Administered 2023-04-16 – 2023-04-17 (×2): 3 mL via INTRAVENOUS

## 2023-04-16 MED ORDER — INSULIN ASPART 100 UNIT/ML IJ SOLN: 6.0000 [IU] | Freq: Three times a day (TID) | INTRAMUSCULAR | Status: DC

## 2023-04-16 MED ORDER — HEPARIN BOLUS VIA INFUSION
4000.0000 [IU] | Freq: Once | INTRAVENOUS | Status: AC
  Administered 2023-04-16: 4000 [IU] via INTRAVENOUS
  Filled 2023-04-16: qty 4000

## 2023-04-16 MED ORDER — INSULIN ASPART 100 UNIT/ML IJ SOLN
0.0000 [IU] | Freq: Three times a day (TID) | INTRAMUSCULAR | Status: DC
  Administered 2023-04-16: 3 [IU] via SUBCUTANEOUS

## 2023-04-16 MED ORDER — ATORVASTATIN CALCIUM 10 MG PO TABS
10.0000 mg | ORAL_TABLET | Freq: Every day | ORAL | Status: DC
  Administered 2023-04-16 – 2023-04-18 (×3): 10 mg via ORAL
  Filled 2023-04-16 (×3): qty 1

## 2023-04-16 NOTE — ED Provider Notes (Signed)
EMERGENCY DEPARTMENT AT [facility] Provider Note   CSN: [id number] Arrival date & time: 04/16/23  1241     History  Chief Complaint  Patient presents with   Chest Pain   Neck Pain    Debra Jordan is a 72 y.o. female.  72 yo F with a chief complaint of chest pain.  This is just left of midline sharp seems to come and go.  Worse with lifting boxes and certain movements.  She feels like it radiates into her right side of her neck.  Sharp in her chest but feels like a muscle spasm in her neck.  She had symptoms off and on for the past couple weeks.  She was visiting a friend up in the ICU and had recurrence of her symptoms and was encouraged to come here to be evaluated.  She denies any difficulty breathing denies vomiting denies diaphoresis.  Denies abdominal pain.  Patient denies history of MI, denies smoking.  Denies family history of MI. Debra Jordan has hx of hypertension hyperlipidemia diabetes   Patient denies history of PE or DVT denies hemoptysis denies unilateral lower extremity edema denies recent surgery immobilization hospitalization estrogen use or history of cancer.  She does have a history of blue toe syndrome and is supposed to be on long-term anticoagulation.  When she takes that she has frequent nosebleeds and so is not taking it currently.     Chest Pain Neck Pain Associated symptoms: chest pain        Home Medications Prior to Admission medications   Medication Sig Start Date End Date Taking? Authorizing Provider  cyclobenzaprine (FLEXERIL) 10 MG tablet Take 10 mg by mouth at bedtime.    [provider]  donepezil (ARICEPT) 5 MG tablet Take 1 tablet (5 mg total) by mouth daily. 10/12/22   Marcos Eke, PA-C  insulin glargine, 2 Unit Dial, (TOUJEO MAX SOLOSTAR) 300 UNIT/ML Solostar Pen Inject 54 Units into the skin 2 (two) times daily.    [provider]  insulin lispro (HUMALOG KWIKPEN) 200 UNIT/ML KwikPen Inject 10-25 Units  into the skin 3 (three) times daily with meals as needed (high blood sugar).    [provider]  labetalol (NORMODYNE) 100 MG tablet Take 100 mg by mouth 2 (two) times daily.    [provider]  levothyroxine (SYNTHROID) 112 MCG tablet Take 112 mcg by mouth at bedtime.    [provider]  losartan (COZAAR) 50 MG tablet Take 50 mg by mouth 2 (two) times daily.    [provider]  naproxen sodium (ALEVE) 220 MG tablet Take 440 mg by mouth 2 (two) times daily as needed (pain).    [provider]      Allergies    Statins, Formaldehyde, Norvasc [amlodipine], Sulfa antibiotics, Latex, and Penicillins    Review of Systems   Review of Systems  Cardiovascular:  Positive for chest pain.  Musculoskeletal:  Positive for neck pain.    Physical Exam Updated Vital Signs BP (!) 149/86   Pulse 91   Temp 98.4 F (36.9 C) (Oral)   Resp 20   Ht 5\' 4"  (1.626 m)   Wt 108.9 kg   SpO2 99%   BMI 41.20 kg/m  Physical Exam Vitals and nursing note reviewed.  Constitutional:      General: She is not in acute distress.    Appearance: She is well-developed. She is not diaphoretic.  HENT:     Head: Normocephalic  and atraumatic.  Eyes:     Pupils: Pupils are equal, round, and reactive to light.  Cardiovascular:     Rate and Rhythm: Normal rate and regular rhythm.     Heart sounds: No murmur heard.    No friction rub. No gallop.  Pulmonary:     Effort: Pulmonary effort is normal.     Breath sounds: No wheezing or rales.  Chest:     Chest wall: Tenderness present.     Comments: Pain about the right sternal border about rib 3 4 and 5 reproduce her discomfort. Abdominal:     General: There is no distension.     Palpations: Abdomen is soft.     Tenderness: There is no abdominal tenderness.  Musculoskeletal:        General: No tenderness.     Cervical back: Normal range of motion and neck supple.  Skin:    General: Skin is warm and dry.  Neurological:      Mental Status: She is alert and oriented to person, place, and time.  Psychiatric:        Behavior: Behavior normal.     ED Results / Procedures / Treatments   Labs (all labs ordered are listed, but only abnormal results are displayed) Labs Reviewed  COMPREHENSIVE METABOLIC PANEL - Abnormal; Notable for the following components:      Result Value   Glucose, Bld 208 (*)    Creatinine, Ser 1.48 (*)    GFR, Estimated 38 (*)    All other components within normal limits  TROPONIN I (HIGH SENSITIVITY) - Abnormal; Notable for the following components:   Troponin I (High Sensitivity) 77 (*)    All other components within normal limits  CBC WITH DIFFERENTIAL/PLATELET  LIPASE, BLOOD  TROPONIN I (HIGH SENSITIVITY)    EKG EKG Interpretation Date/Time:  Sunday April 16 2023 14:32:51 EDT Ventricular Rate:  87 PR Interval:  148 QRS Duration:  97 QT Interval:  399 QTC Calculation: 480 R Axis:   -40  Text Interpretation: Sinus rhythm Left anterior fascicular block LVH with secondary repolarization abnormality Anterior Q waves, possibly due to LVH No significant change since last tracing Confirmed by Melene Plan [phone]) on 04/16/2023 2:36:51 PM  Radiology DG Chest Port 1 View  Result Date: 04/16/2023 CLINICAL DATA:  Chest pain EXAM: PORTABLE CHEST 1 VIEW COMPARISON:  None Available. FINDINGS: No consolidation, pneumothorax or effusion. Normal cardiopericardial silhouette without edema. Overlapping cardiac leads. IMPRESSION: No acute cardiopulmonary disease. Electronically Signed   By: Karen Kays M.D.   On: 04/16/2023 14:31    Procedures Procedures    Medications Ordered in ED Medications - No data to display  ED Course/ Medical Decision Making/ A&P Clinical Course as of 04/16/23 1520  Sun Apr 16, 2023  1501 MSK chest pain for weeks but worse, reproducible but elevated trop 77, cards paged, hx HTN/HLD/DM, PAD [JD]    Clinical Course User Index [JD] Laurence Spates, MD                                  Medical Decision Making Amount and/or Complexity of Data Reviewed Labs: ordered. Radiology: ordered. ECG/medicine tests: ordered.   72 yo F with a chief complaints of chest pain.  Atypical in nature and reproduced on exam.  Going on for the past couple weeks.  She did have sudden onset of her symptoms less than an hour ago.  Feel compelled to do a delta troponin.  Chest x-ray blood work.  She has a history of blue toe syndrome but no history of DVT.   Patient's initial troponin is elevated above her baseline.  Patient without significant electrolyte abnormality, no leukocytosis no anemia.  Plan to discuss with cardiology.  Patient care signed out to Dr. Earlene Plater, please see his note for further details care in the ED.  The patients results and plan were reviewed and discussed.   Any x-rays performed were independently reviewed by myself.   Differential diagnosis were considered with the presenting HPI.  Medications - No data to display  Vitals:   04/16/23 1250 04/16/23 1251 04/16/23 1252  BP: (!) 149/86    Pulse: 91    Resp: 20    Temp:  98.4 F (36.9 C)   TempSrc:  Oral   SpO2: 99%    Weight:   108.9 kg  Height:   5\' 4"  (1.626 m)    Final diagnoses:  Nonspecific chest pain    Admission/ observation were discussed with the admitting physician, patient and/or family and they are comfortable with the plan.           Final Clinical Impression(s) / ED Diagnoses Final diagnoses:  Nonspecific chest pain    Rx / DC Orders ED Discharge Orders     None         Melene Plan, DO 04/16/23 1520

## 2023-04-16 NOTE — Progress Notes (Signed)
ANTICOAGULATION CONSULT NOTE  Pharmacy Consult for heparin Indication: chest pain/ACS Brief A/P: Heparin level within goal range Continue Heparin at current rate   Allergies  Allergen Reactions   Statins Other (See Comments)    Severe myalgia and abdominal cramps  Other Reaction(s): Arthralgias   Formaldehyde Other (See Comments)   Norvasc [Amlodipine]     Difficulty breathing    Sulfa Antibiotics Nausea Only    Other Reaction(s): GI Intolerance   Latex Rash    Occasional SOB   Penicillins Rash    angioedema    Patient Measurements: Height: 5\' 4"  (162.6 cm) Weight: 108.9 kg (240 lb) IBW/kg (Calculated) : 54.7 Heparin Dosing Weight: 80kg  Vital Signs: Temp: 98.3 F (36.8 C) (08/25 1945) Temp Source: Oral (08/25 1945) BP: 154/93 (08/25 1945) Pulse Rate: 87 (08/25 1945)  Labs: Recent Labs    04/16/23 1318 04/16/23 1509 04/16/23 2236  HGB 12.9  --   --   HCT 41.1  --   --   PLT 216  --   --   HEPARINUNFRC  --   --  0.33  CREATININE 1.48*  --   --   TROPONINIHS 77* 84*  --     Estimated Creatinine Clearance: 43.3 mL/min (A) (by C-G formula based on SCr of 1.48 mg/dL (H)).  Assessment: 72 y.o. female with chest pain for heparin   Goal of Therapy:  Heparin level 0.3-0.7 units/ml Monitor platelets by anticoagulation protocol: Yes   Plan:  .No change to heparin  Follow-up am labs.  Geannie Risen, PharmD, BCPS  04/16/2023 11:36 PM

## 2023-04-16 NOTE — Progress Notes (Signed)
ANTICOAGULATION CONSULT NOTE - Initial Consult  Pharmacy Consult for heparin Indication: chest pain/ACS  Allergies  Allergen Reactions   Statins Other (See Comments)    Severe myalgia and abdominal cramps  Other Reaction(s): Arthralgias   Formaldehyde Other (See Comments)   Norvasc [Amlodipine]     Difficulty breathing    Sulfa Antibiotics Nausea Only    Other Reaction(s): GI Intolerance   Latex Rash    Occasional SOB   Penicillins Rash    angioedema    Patient Measurements: Height: 5\' 4"  (162.6 cm) Weight: 108.9 kg (240 lb) IBW/kg (Calculated) : 54.7 Heparin Dosing Weight: 80kg  Vital Signs: Temp: 98.4 F (36.9 C) (08/25 1251) Temp Source: Oral (08/25 1251) BP: 133/78 (08/25 1545) Pulse Rate: 88 (08/25 1545)  Labs: Recent Labs    04/16/23 1318 04/16/23 1509  HGB 12.9  --   HCT 41.1  --   PLT 216  --   CREATININE 1.48*  --   TROPONINIHS 77* 84*    Estimated Creatinine Clearance: 43.3 mL/min (A) (by C-G formula based on SCr of 1.48 mg/dL (H)).   Medical History: Past Medical History:  Diagnosis Date   ADHD (attention deficit hyperactivity disorder)    Arthritis    gout   Bilateral shoulder pain 05/13/2022   Blue toe syndrome 10/06/2011   Broken shoulder    And tailbone    Bursitis    Hip and Shoulder per patient   Cataract    Mixed form OU   Chronic fatigue, unspecified 03/03/2019   Chronic kidney disease    hx of left nephrectomy   Diabetic retinopathy    NPDR OU   Dyspnea    Since having COVID   Elevated troponin 09/17/2021   Epigastric pain 09/17/2021   Family history of Alzheimer's disease 10/12/2022   Fatty liver    Fibromyalgia    Gallstones without obstruction of gallbladder 10/04/2021   Gout 09/06/2011   History of COVID-19 03/03/2019   History of headache    History of kidney stones    History of nephrectomy, unilateral 09/06/2011   History of PCOS    History of skin cancer    Skin Cancer   History of venous thrombosis and  embolism    Left great toe   HTN (hypertension) 09/06/2011   Hypercalcemia 09/17/2021   Hyperlipidemia    Hypersomnolence    Hypertensive retinopathy    OU   Hypertriglyceridemia 09/06/2011   Hypothyroidism 09/06/2011   Ischemia of extremity 09/06/2011   Lacunar infarction    left periatrial white matter   Mass of finger of right hand 03/11/2019   Migraine headache 09/06/2011   Neuropathy    feet bilateral   Obesity    Osteoarthritis, hand 08/06/2020   Pain in right hip 08/22/1972   Pain in right shoulder 08/06/2020   Pain in toe of right foot 09/05/2011   Pancreatic cyst 10/04/2021   Pneumonia    Seizure disorder    Somatic symptom disorder 11/07/2022   Subjective memory complaints 11/07/2022   Trouble swallowing    Type II diabetes mellitus 09/06/2011   Assessment: 71 YOF presenting with CP, elevated troponin, she is not on anticoagulation PTA, CBC wnl, cards plan for cath  Goal of Therapy:  Heparin level 0.3-0.7 units/ml Monitor platelets by anticoagulation protocol: Yes   Plan:  Heparin 4000 units IV x 1, and gtt at 1000 units/hr F/u 6 hour heparin level F/u cath plans  Daylene Posey, PharmD, BCEMP Clinical Pharmacist ED Pharmacist  Phone # [phone] 04/16/2023 4:38 PM

## 2023-04-16 NOTE — ED Triage Notes (Signed)
Pt c/o intermittent sharp L chest pain and R neck pain x2 weeks.  Pain score 3/10.  Pt reports pain increased w/ exertion.  Hx of HTN, DM, and high cholesterol.

## 2023-04-16 NOTE — ED Provider Notes (Signed)
Handoff received from Dr. Adela Lank with plan to talk with cardiology and obtain second troponin.  Second troponin is 84 from 77.  She is chest pain-free but chest pain is concerning for angina.  Discussed with Dr. Jacinto Halim who will admit and likely perform heart cath tomorrow.  Patient is agreeable.   Laurence Spates, MD 04/16/23 [phone]

## 2023-04-16 NOTE — ED Notes (Signed)
ED TO INPATIENT HANDOFF REPORT  ED Nurse Name and Phone #: Mildred Bollard [phone]  S Name/Age/Gender Debra Jordan 72 y.o. female Room/Bed: 029C/029C  Code Status   Code Status: Full Code  Home/SNF/Other Home Patient oriented to: self, place, time, and situation Is this baseline? Yes   Triage Complete: Triage complete  Chief Complaint Unstable angina pectoris (HCC) [I20.0]  Triage Note Pt c/o intermittent sharp L chest pain and R neck pain x2 weeks.  Pain score 3/10.  Pt reports pain increased w/ exertion.  Hx of HTN, DM, and high cholesterol.     Allergies Allergies  Allergen Reactions   Statins Other (See Comments)    Severe myalgia and abdominal cramps  Other Reaction(s): Arthralgias   Formaldehyde Other (See Comments)   Norvasc [Amlodipine]     Difficulty breathing    Sulfa Antibiotics Nausea Only    Other Reaction(s): GI Intolerance   Latex Rash    Occasional SOB   Penicillins Rash    angioedema    Level of Care/Admitting Diagnosis ED Disposition     ED Disposition  Admit   Condition  --   Comment  Hospital Area: [facility] [[id number]]  Level of Care: Telemetry Cardiac [103]  May place patient in observation at [facility] or [facility] if equivalent level of care is available:: No  Covid Evaluation: Asymptomatic - no recent exposure (last 10 days) testing not required  Diagnosis: Unstable angina pectoris [facility]) [[id number]]  Admitting Physician: Erenest Rasher  Attending Physician: Erenest Rasher          B Medical/Surgery History Past Medical History:  Diagnosis Date   ADHD (attention deficit hyperactivity disorder)    Arthritis    gout   Bilateral shoulder pain 05/13/2022   Blue toe syndrome 10/06/2011   Broken shoulder    And tailbone    Bursitis    Hip and Shoulder per patient   Cataract    Mixed form OU   Chronic fatigue, unspecified 03/03/2019   Chronic kidney disease    hx of left nephrectomy   Diabetic retinopathy     NPDR OU   Dyspnea    Since having COVID   Elevated troponin 09/17/2021   Epigastric pain 09/17/2021   Family history of Alzheimer's disease 10/12/2022   Fatty liver    Fibromyalgia    Gallstones without obstruction of gallbladder 10/04/2021   Gout 09/06/2011   History of COVID-19 03/03/2019   History of headache    History of kidney stones    History of nephrectomy, unilateral 09/06/2011   History of PCOS    History of skin cancer    Skin Cancer   History of venous thrombosis and embolism    Left great toe   HTN (hypertension) 09/06/2011   Hypercalcemia 09/17/2021   Hyperlipidemia    Hypersomnolence    Hypertensive retinopathy    OU   Hypertriglyceridemia 09/06/2011   Hypothyroidism 09/06/2011   Ischemia of extremity 09/06/2011   Lacunar infarction    left periatrial white matter   Mass of finger of right hand 03/11/2019   Migraine headache 09/06/2011   Neuropathy    feet bilateral   Obesity    Osteoarthritis, hand 08/06/2020   Pain in right hip 08/22/1972   Pain in right shoulder 08/06/2020   Pain in toe of right foot 09/05/2011   Pancreatic cyst 10/04/2021   Pneumonia    Seizure disorder    Somatic symptom disorder 11/07/2022   Subjective  memory complaints 11/07/2022   Trouble swallowing    Type II diabetes mellitus 09/06/2011   Past Surgical History:  Procedure Laterality Date   ABDOMINAL HYSTERECTOMY     CHOLECYSTECTOMY N/A 10/22/2021   Procedure: LAPAROSCOPIC CHOLECYSTECTOMY;  Surgeon: Fritzi Mandes, MD;  Location: [facility] OR;  Service: General;  Laterality: N/A;   HERNIA REPAIR     LYMPH NODE BIOPSY     NASAL SINUS SURGERY     NEPHRECTOMY Left 2002   left nephrectomy   TONSILLECTOMY  1969   TUBAL LIGATION       A IV Location/Drains/Wounds Patient Lines/Drains/Airways Status     Active Line/Drains/Airways     Name Placement date Placement time Site Days   Peripheral IV 04/16/23 20 G Left Antecubital 04/16/23  1412  Antecubital  less than 1    Incision - 4 Ports Abdomen Umbilicus Mid;Upper Right;Upper Right;Lower 10/22/21  0859  -- 541            Intake/Output Last 24 hours No intake or output data in the 24 hours ending 04/16/23 1652  Labs/Imaging Results for orders placed or performed during the hospital encounter of 04/16/23 (from the past 48 hour(s))  CBC with Differential     Status: None   Collection Time: 04/16/23  1:18 PM  Result Value Ref Range   WBC 7.7 4.0 - 10.5 K/uL   RBC 4.73 3.87 - 5.11 MIL/uL   Hemoglobin 12.9 12.0 - 15.0 g/dL   HCT 40.9 81.1 - 91.4 %   MCV 86.9 80.0 - 100.0 fL   MCH 27.3 26.0 - 34.0 pg   MCHC 31.4 30.0 - 36.0 g/dL   RDW 78.2 95.6 - 21.3 %   Platelets 216 150 - 400 K/uL   nRBC 0.0 0.0 - 0.2 %   Neutrophils Relative % 66 %   Neutro Abs 5.1 1.7 - 7.7 K/uL   Lymphocytes Relative 24 %   Lymphs Abs 1.8 0.7 - 4.0 K/uL   Monocytes Relative 7 %   Monocytes Absolute 0.5 0.1 - 1.0 K/uL   Eosinophils Relative 2 %   Eosinophils Absolute 0.2 0.0 - 0.5 K/uL   Basophils Relative 1 %   Basophils Absolute 0.1 0.0 - 0.1 K/uL   Immature Granulocytes 0 %   Abs Immature Granulocytes 0.02 0.00 - 0.07 K/uL    Comment: Performed at [facility], [address] [place], [place] [address]  Comprehensive metabolic panel     Status: Abnormal   Collection Time: 04/16/23  1:18 PM  Result Value Ref Range   Sodium 140 135 - 145 mmol/L   Potassium 4.5 3.5 - 5.1 mmol/L   Chloride 102 98 - 111 mmol/L   CO2 26 22 - 32 mmol/L   Glucose, Bld 208 (H) 70 - 99 mg/dL    Comment: Glucose reference range applies only to samples taken after fasting for at least 8 hours.   BUN 18 8 - 23 mg/dL   Creatinine, Ser 8.46 (H) 0.44 - 1.00 mg/dL   Calcium 9.4 8.9 - 96.2 mg/dL   Total Protein 6.7 6.5 - 8.1 g/dL   Albumin 3.7 3.5 - 5.0 g/dL   AST 24 15 - 41 U/L   ALT 21 0 - 44 U/L   Alkaline Phosphatase 57 38 - 126 U/L   Total Bilirubin 0.7 0.3 - 1.2 mg/dL   GFR, Estimated 38 (L) >60 mL/min    Comment:  (NOTE) Calculated using the CKD-EPI Creatinine Equation (2021)  Anion gap 12 5 - 15    Comment: Performed at [facility], [address] [place], [place] [address]  Lipase, blood     Status: None   Collection Time: 04/16/23  1:18 PM  Result Value Ref Range   Lipase 30 11 - 51 U/L    Comment: Performed at [facility], [address], [place], [place] [address]  Troponin I (High Sensitivity)     Status: Abnormal   Collection Time: 04/16/23  1:18 PM  Result Value Ref Range   Troponin I (High Sensitivity) 77 (H) <18 ng/L    Comment: (NOTE) Elevated high sensitivity troponin I (hsTnI) values and significant  changes across serial measurements may suggest ACS but many other  chronic and acute conditions are known to elevate hsTnI results.  Refer to the "Links" section for chest pain algorithms and additional  guidance. Performed at [facility], [address] [place], [place] [address]   Troponin I (High Sensitivity)     Status: Abnormal   Collection Time: 04/16/23  3:09 PM  Result Value Ref Range   Troponin I (High Sensitivity) 84 (H) <18 ng/L    Comment: (NOTE) Elevated high sensitivity troponin I (hsTnI) values and significant  changes across serial measurements may suggest ACS but many other  chronic and acute conditions are known to elevate hsTnI results.  Refer to the "Links" section for chest pain algorithms and additional  guidance. Performed at [facility], [address], [place], [place] [address]    DG Chest Port 1 View  Result Date: 04/16/2023 CLINICAL DATA:  Chest pain EXAM: PORTABLE CHEST 1 VIEW COMPARISON:  None Available. FINDINGS: No consolidation, pneumothorax or effusion. Normal cardiopericardial silhouette without edema. Overlapping cardiac leads. IMPRESSION: No acute cardiopulmonary disease. Electronically Signed   By: Karen Kays M.D.   On: 04/16/2023 14:31    Pending Labs Unresulted Labs (From admission, onward)     Start      Ordered   04/17/23 0500  Heparin level (unfractionated)  Daily,   R     Placed in "And" Linked Group   04/16/23 1639   04/17/23 0500  CBC  Daily,   R     Placed in "And" Linked Group   04/16/23 1639   04/16/23 2300  Heparin level (unfractionated)  Once-Timed,   TIMED        04/16/23 1639   04/16/23 1639  Lipid panel  Once,   R        04/16/23 1638   04/16/23 1639  TSH  Once,   R        04/16/23 1638   04/16/23 1637  Hemoglobin A1c  Once,   R       Comments: To assess prior glycemic control    04/16/23 1638   04/16/23 1634  HIV Antibody (routine testing w rflx)  (HIV Antibody (Routine testing w reflex) panel)  Once,   R        04/16/23 1636            Vitals/Pain Today's Vitals   04/16/23 1250 04/16/23 1251 04/16/23 1252 04/16/23 1545  BP: (!) 149/86   133/78  Pulse: 91   88  Resp: 20   19  Temp:  98.4 F (36.9 C)    TempSrc:  Oral    SpO2: 99%   98%  Weight:   108.9 kg   Height:   5\' 4"  (1.626 m)  PainSc:   3      Isolation Precautions No active isolations  Medications Medications  aspirin tablet 325 mg (325 mg Oral Given 04/16/23 1536)  labetalol (NORMODYNE) tablet 100 mg (has no administration in time range)  losartan (COZAAR) tablet 50 mg (has no administration in time range)  cyclobenzaprine (FLEXERIL) tablet 10 mg (has no administration in time range)  levothyroxine (SYNTHROID) tablet 112 mcg (has no administration in time range)  insulin lispro (HUMALOG) KwikPen SOPN 10-25 Units (has no administration in time range)  insulin glargine (2 Unit Dial) (TOUJEO MAX) Solostar Pen SOPN 54 Units (has no administration in time range)  donepezil (ARICEPT) tablet 5 mg (has no administration in time range)  sodium chloride flush (NS) 0.9 % injection 3 mL (has no administration in time range)  acetaminophen (TYLENOL) tablet 650 mg (has no administration in time range)  ondansetron (ZOFRAN) injection 4 mg (has no administration in time range)  insulin aspart  (novoLOG) injection 0-20 Units (has no administration in time range)  atorvastatin (LIPITOR) tablet 10 mg (has no administration in time range)  heparin bolus via infusion 4,000 Units (has no administration in time range)  heparin ADULT infusion 100 units/mL (25000 units/248mL) (has no administration in time range)    Mobility walks     Focused Assessments Cardiac Assessment Handoff:    No results found for: "CKTOTAL", "CKMB", "CKMBINDEX", "TROPONINI" No results found for: "DDIMER" Does the Patient currently have chest pain? Yes    R Recommendations: See Admitting Provider Note  Report given to:   Additional Notes:

## 2023-04-16 NOTE — Progress Notes (Signed)
Patient is chest apin free and has multiple CV risks including DM, HTN, HLD (no statin due to intolerance), morbid obesity. Will admit and plan on cardiac cath tomorrow. I spoke to her son who is aware of her situation now. Patient's daughter Hjordis Howley is in the hospital as well with sepsis.   Yates Decamp, MD, [facility] 04/16/2023, 4:46 PM Office: [phone] Fax: [phone] Pager: [phone]

## 2023-04-17 ENCOUNTER — Encounter (HOSPITAL_COMMUNITY)
Admission: EM | Disposition: E | Payer: Self-pay | Source: Home / Self Care | Attending: Thoracic Surgery (Cardiothoracic Vascular Surgery)

## 2023-04-17 ENCOUNTER — Other Ambulatory Visit: Payer: Self-pay

## 2023-04-17 DIAGNOSIS — I25110 Atherosclerotic heart disease of native coronary artery with unstable angina pectoris: Secondary | ICD-10-CM

## 2023-04-17 HISTORY — PX: LEFT HEART CATH AND CORONARY ANGIOGRAPHY: CATH118249

## 2023-04-17 LAB — CBC
HCT: 39.4 % (ref 36.0–46.0)
Hemoglobin: 12.4 g/dL (ref 12.0–15.0)
MCH: 26.8 pg (ref 26.0–34.0)
MCHC: 31.5 g/dL (ref 30.0–36.0)
MCV: 85.1 fL (ref 80.0–100.0)
Platelets: 205 10*3/uL (ref 150–400)
RBC: 4.63 MIL/uL (ref 3.87–5.11)
RDW: 14.7 % (ref 11.5–15.5)
WBC: 7.3 10*3/uL (ref 4.0–10.5)
nRBC: 0 % (ref 0.0–0.2)

## 2023-04-17 LAB — GLUCOSE, CAPILLARY
Glucose-Capillary: 87 mg/dL (ref 70–99)
Glucose-Capillary: 97 mg/dL (ref 70–99)
Glucose-Capillary: 80 mg/dL (ref 70–99)
Glucose-Capillary: 114 mg/dL — ABNORMAL HIGH (ref 70–99)

## 2023-04-17 LAB — HEPARIN LEVEL (UNFRACTIONATED): Heparin Unfractionated: 0.2 [IU]/mL — ABNORMAL LOW (ref 0.30–0.70)

## 2023-04-17 SURGERY — LEFT HEART CATH AND CORONARY ANGIOGRAPHY
Anesthesia: LOCAL

## 2023-04-17 MED ORDER — SODIUM CHLORIDE 0.9% FLUSH
3.0000 mL | Freq: Two times a day (BID) | INTRAVENOUS | Status: DC
  Administered 2023-04-17 – 2023-04-18 (×2): 3 mL via INTRAVENOUS

## 2023-04-17 MED ORDER — HEPARIN (PORCINE) IN NACL 1000-0.9 UT/500ML-% IV SOLN
INTRAVENOUS | Status: DC | PRN
  Administered 2023-04-17 (×2): 500 mL

## 2023-04-17 MED ORDER — SODIUM CHLORIDE 0.9 % IV SOLN: 250.0000 mL | INTRAVENOUS | Status: DC | PRN

## 2023-04-17 MED ORDER — SODIUM CHLORIDE 0.9 % IV SOLN: INTRAVENOUS | Status: AC

## 2023-04-17 MED ORDER — METOPROLOL TARTRATE 50 MG PO TABS
50.0000 mg | ORAL_TABLET | Freq: Two times a day (BID) | ORAL | Status: DC
  Administered 2023-04-17 – 2023-04-18 (×3): 50 mg via ORAL
  Filled 2023-04-17 (×3): qty 1

## 2023-04-17 MED ORDER — LIDOCAINE HCL (PF) 1 % IJ SOLN
INTRAMUSCULAR | Status: AC
  Filled 2023-04-17: qty 30

## 2023-04-17 MED ORDER — MIDAZOLAM HCL 2 MG/2ML IJ SOLN
INTRAMUSCULAR | Status: DC | PRN
  Administered 2023-04-17: 2 mg via INTRAVENOUS

## 2023-04-17 MED ORDER — MIDAZOLAM HCL 2 MG/2ML IJ SOLN
INTRAMUSCULAR | Status: AC
  Filled 2023-04-17: qty 2

## 2023-04-17 MED ORDER — HEPARIN (PORCINE) 25000 UT/250ML-% IV SOLN
1450.0000 [IU]/h | INTRAVENOUS | Status: DC
  Filled 2023-04-17: qty 250

## 2023-04-17 MED ORDER — HEPARIN SODIUM (PORCINE) 1000 UNIT/ML IJ SOLN
INTRAMUSCULAR | Status: AC
  Filled 2023-04-17: qty 10

## 2023-04-17 MED ORDER — FENTANYL CITRATE (PF) 100 MCG/2ML IJ SOLN
INTRAMUSCULAR | Status: AC
  Filled 2023-04-17: qty 2

## 2023-04-17 MED ORDER — SODIUM CHLORIDE 0.9 % WEIGHT BASED INFUSION
1.0000 mL/kg/h | INTRAVENOUS | Status: DC
  Administered 2023-04-17 (×2): 1 mL/kg/h via INTRAVENOUS

## 2023-04-17 MED ORDER — FENTANYL CITRATE (PF) 100 MCG/2ML IJ SOLN
INTRAMUSCULAR | Status: DC | PRN
  Administered 2023-04-17: 25 ug via INTRAVENOUS

## 2023-04-17 MED ORDER — LIDOCAINE HCL (PF) 1 % IJ SOLN
INTRAMUSCULAR | Status: DC | PRN
  Administered 2023-04-17: 2 mL

## 2023-04-17 MED ORDER — VERAPAMIL HCL 2.5 MG/ML IV SOLN
INTRAVENOUS | Status: AC
  Filled 2023-04-17: qty 2

## 2023-04-17 MED ORDER — SODIUM CHLORIDE 0.9% FLUSH: 3.0000 mL | INTRAVENOUS | Status: DC | PRN

## 2023-04-17 MED ORDER — EZETIMIBE 10 MG PO TABS
10.0000 mg | ORAL_TABLET | Freq: Every day | ORAL | Status: DC
  Administered 2023-04-17 – 2023-04-18 (×2): 10 mg via ORAL
  Filled 2023-04-17 (×2): qty 1

## 2023-04-17 MED ORDER — SODIUM CHLORIDE 0.9 % WEIGHT BASED INFUSION
3.0000 mL/kg/h | INTRAVENOUS | Status: DC
  Administered 2023-04-17: 3 mL/kg/h via INTRAVENOUS

## 2023-04-17 MED ORDER — ASPIRIN 81 MG PO CHEW
81.0000 mg | CHEWABLE_TABLET | ORAL | Status: AC
  Administered 2023-04-17: 81 mg via ORAL
  Filled 2023-04-17: qty 1

## 2023-04-17 MED ORDER — HEPARIN SODIUM (PORCINE) 1000 UNIT/ML IJ SOLN
INTRAMUSCULAR | Status: DC | PRN
  Administered 2023-04-17: 5000 [IU] via INTRAVENOUS

## 2023-04-17 MED ORDER — ASPIRIN 81 MG PO TBEC
81.0000 mg | DELAYED_RELEASE_TABLET | Freq: Every day | ORAL | Status: DC
  Administered 2023-04-18: 81 mg via ORAL
  Filled 2023-04-17: qty 1

## 2023-04-17 MED ORDER — HYDRALAZINE HCL 20 MG/ML IJ SOLN: 10.0000 mg | INTRAMUSCULAR | Status: AC | PRN

## 2023-04-17 MED ORDER — VERAPAMIL HCL 2.5 MG/ML IV SOLN: INTRAVENOUS | Status: DC | PRN

## 2023-04-17 MED ORDER — INSULIN GLARGINE-YFGN 100 UNIT/ML ~~LOC~~ SOLN
54.0000 [IU] | Freq: Two times a day (BID) | SUBCUTANEOUS | Status: DC
  Administered 2023-04-17 – 2023-04-18 (×2): 54 [IU] via SUBCUTANEOUS
  Filled 2023-04-17 (×3): qty 0.54

## 2023-04-17 SURGICAL SUPPLY — 9 items
CATH INFINITI AMBI 5FR TG (CATHETERS) IMPLANT
CATH INFINITI JR4 5F (CATHETERS) IMPLANT
DEVICE RAD COMP TR BAND LRG (VASCULAR PRODUCTS) IMPLANT
ELECT DEFIB PAD ADLT CADENCE (PAD) IMPLANT
GLIDESHEATH SLEND SS 6F .021 (SHEATH) IMPLANT
GUIDEWIRE INQWIRE 1.5J.035X260 (WIRE) IMPLANT
INQWIRE 1.5J .035X260CM (WIRE) ×1
PACK CARDIAC CATHETERIZATION (CUSTOM PROCEDURE TRAY) ×1 IMPLANT
SET ATX-X65L (MISCELLANEOUS) IMPLANT

## 2023-04-17 NOTE — Progress Notes (Signed)
   04/17/23 1200  Spiritual Encounters  Type of Visit Initial  Care provided to: Patient  Referral source Patient request  Reason for visit Routine spiritual support  OnCall Visit No  Spiritual Framework  Presenting Themes Significant life change;Impactful experiences and emotions  Values/beliefs faith  Community/Connection Family  Needs/Challenges/Barriers isolated for faith community  Patient Stress Factors Health changes  Interventions  Spiritual Care Interventions Made Compassionate presence;Established relationship of care and support;Reflective listening;Normalization of emotions;Meaning making;Prayer  Intervention Outcomes  Outcomes Connection to spiritual care;Awareness of support;Reduced isolation;Reduced anxiety   Ch responded to request for emotional and spiritual support. There was no family present at bedside. Pt has a lot of things going on in her life which left her feeling overwhelmed. Her faith is very important to her. She belongs to the [place] faith. Pt is worrying about her daughter who is currently admitted to the ICU floor. Plus, she has a scheduled surgery coming up. Pt asked Ch to pray that the will of God be done in her daughter's situation and that if she is going to live and that she will have a speedy recovery and she is going to die that she dies in peace. Ch prayed for pt and helped feel comforted. Ch remains available, if needed.

## 2023-04-17 NOTE — H&P (Signed)
CARDIOLOGY ADMIT NOTE   Patient ID: Debra Jordan MRN: [id number] DOB/AGE: 04/15/1951 72 y.o.  Admit date: 04/16/2023 Primary Physician:  Creola Corn, MD  Patient ID: Debra Jordan, female    DOB: Jan 07, 1951, 72 y.o.   MRN: [id number]  Chief Complaint  Patient presents with   Chest Pain   Neck Pain   HPI:    Debra Jordan  is a 72 y.o. with type 2 diabetes with stage 3 CKD and h/o left nephrectomy for pelvic kidney in 1995,, hyperlipidemia with severe statin intolerance with myalgia and abdominal discomfort, hypertension, hypothyroidism, history of migraines, history of gout,  chronic back pain, history of recurrent embolic toe ischemia in 2013 (left great toe and right 2nd toe) and has been on low-dose Xarelto since and hypercoagulable work up negative, echo in 2013 with bubble study negative for shunt.    Patient has been under extreme stress as her daughter who has special needs is also admitted to the hospital with sepsis, over the past 2 weeks she had noticed gradually worsening exertional chest pain with radiation to her neck associated with marked dyspnea.  Symptoms started about 3 months ago but states that in the last 2 to 3 days especially even doing minimal chores in the house or walking to the bathroom brings on the chest discomfort and marked dyspnea.  She was visiting her daughter when the nurses advised her to go to the ED.  This morning patient remains asymptomatic.  Past Medical History:  Diagnosis Date   ADHD (attention deficit hyperactivity disorder)    Arthritis    gout   Bilateral shoulder pain 05/13/2022   Blue toe syndrome 10/06/2011   Broken shoulder    And tailbone    Bursitis    Hip and Shoulder per patient   Cataract    Mixed form OU   Chronic fatigue, unspecified 03/03/2019   Chronic kidney disease    hx of left nephrectomy   Diabetic retinopathy    NPDR OU   Dyspnea    Since having COVID   Elevated troponin 09/17/2021   Epigastric pain  09/17/2021   Family history of Alzheimer's disease 10/12/2022   Fatty liver    Fibromyalgia    Gallstones without obstruction of gallbladder 10/04/2021   Gout 09/06/2011   History of COVID-19 03/03/2019   History of headache    History of kidney stones    History of nephrectomy, unilateral 09/06/2011   History of PCOS    History of skin cancer    Skin Cancer   History of venous thrombosis and embolism    Left great toe   HTN (hypertension) 09/06/2011   Hypercalcemia 09/17/2021   Hyperlipidemia    Hypersomnolence    Hypertensive retinopathy    OU   Hypertriglyceridemia 09/06/2011   Hypothyroidism 09/06/2011   Ischemia of extremity 09/06/2011   Lacunar infarction    left periatrial white matter   Mass of finger of right hand 03/11/2019   Migraine headache 09/06/2011   Neuropathy    feet bilateral   Obesity    Osteoarthritis, hand 08/06/2020   Pain in right hip 08/22/1972   Pain in right shoulder 08/06/2020   Pain in toe of right foot 09/05/2011   Pancreatic cyst 10/04/2021   Pneumonia    Seizure disorder    Somatic symptom disorder 11/07/2022   Subjective memory complaints 11/07/2022   Trouble swallowing    Type II diabetes mellitus 09/06/2011   Past Surgical History:  Procedure  Laterality Date   ABDOMINAL HYSTERECTOMY     CHOLECYSTECTOMY N/A 10/22/2021   Procedure: LAPAROSCOPIC CHOLECYSTECTOMY;  Surgeon: Fritzi Mandes, MD;  Location: [facility] OR;  Service: General;  Laterality: N/A;   HERNIA REPAIR     LYMPH NODE BIOPSY     NASAL SINUS SURGERY     NEPHRECTOMY Left 2002   left nephrectomy   TONSILLECTOMY  1969   TUBAL LIGATION     Social History   Socioeconomic History   Marital status: Widowed    Spouse name: Not on file   Number of children: 2   Years of education: 16   Highest education level: Bachelor's degree (e.g., BA, AB, BS)  Occupational History    Comment: Contour Engineer, maintenance (IT)) Payroll   Occupation: Retired  Tobacco Use   Smoking status: Never    Smokeless tobacco: Never  Vaping Use   Vaping status: Never Used  Substance and Sexual Activity   Alcohol use: No   Drug use: No   Sexual activity: Not Currently    Birth control/protection: Post-menopausal  Other Topics Concern   Not on file  Social History Narrative   Caffeine- 4-6 cups per day    Lives at home with her adult daughter ( daughter has health problems)    Right handed   One story home   Daughter lives with her , she is disabled   retired   International aid/development worker of Corporate investment banker Strain: Not on file  Food Insecurity: No Food Insecurity (04/16/2023)   Hunger Vital Sign    Worried About Running Out of Food in the Last Year: Never true    Ran Out of Food in the Last Year: Never true  Transportation Needs: No Transportation Needs (04/16/2023)   PRAPARE - Administrator, Civil Service (Medical): No    Lack of Transportation (Non-Medical): No  Physical Activity: Not on file  Stress: Not on file  Social Connections: Not on file  Intimate Partner Violence: Not At Risk (04/16/2023)   Humiliation, Afraid, Rape, and Kick questionnaire    Fear of Current or Ex-Partner: No    Emotionally Abused: No    Physically Abused: No    Sexually Abused: No   Family History  Problem Relation Age of Onset   Other Mother        blood clot history, varicose veins   Stroke Mother    Diabetes Mother    Cancer Father    Healthy Brother    Healthy Brother    Healthy Brother    Stroke Maternal Grandmother    Diabetes Daughter    Dementia Other    Alzheimer's disease Other        several family members stemming from intermarriage    ROS  Review of Systems  Cardiovascular:  Positive for chest pain and dyspnea on exertion. Negative for leg swelling.   Objective      04/17/2023    3:45 AM 04/16/2023   11:36 PM 04/16/2023   11:35 PM  Vitals with BMI  Weight 243 lbs 2 oz    BMI 41.71    Systolic 125 145   Diastolic 67 89   Pulse 87 91 93      Physical  Exam Constitutional:      Appearance: She is morbidly obese.  Neck:     Vascular: No carotid bruit or JVD.  Cardiovascular:     Rate and Rhythm: Normal rate and regular rhythm.  Pulses: Intact distal pulses.     Heart sounds: Normal heart sounds. No murmur heard.    No gallop.  Pulmonary:     Effort: Pulmonary effort is normal.     Breath sounds: Normal breath sounds.  Abdominal:     General: Bowel sounds are normal.     Palpations: Abdomen is soft.  Musculoskeletal:     Right lower leg: No edema.     Left lower leg: No edema.    Laboratory examination:   Recent Labs    04/16/23 1318  NA 140  K 4.5  CL 102  CO2 26  GLUCOSE 208*  BUN 18  CREATININE 1.48*  CALCIUM 9.4  GFRNONAA 38*   estimated creatinine clearance is 43.6 mL/min (A) (by C-G formula based on SCr of 1.48 mg/dL (H)).     Latest Ref Rng & Units 04/16/2023    1:18 PM 10/13/2021    1:06 PM 09/17/2021    5:00 AM  CMP  Glucose 70 - 99 mg/dL 409  811  914   BUN 8 - 23 mg/dL 18  17  16    Creatinine 0.44 - 1.00 mg/dL 7.82  9.56  2.13   Sodium 135 - 145 mmol/L 140  139  140   Potassium 3.5 - 5.1 mmol/L 4.5  4.5  3.9   Chloride 98 - 111 mmol/L 102  104  110   CO2 22 - 32 mmol/L 26  27  23    Calcium 8.9 - 10.3 mg/dL 9.4  9.4  9.5   Total Protein 6.5 - 8.1 g/dL 6.7  6.6  6.7   Total Bilirubin 0.3 - 1.2 mg/dL 0.7  0.7  0.5   Alkaline Phos 38 - 126 U/L 57  63  51   AST 15 - 41 U/L 24  16  32   ALT 0 - 44 U/L 21  12  24        Latest Ref Rng & Units 04/17/2023    3:02 AM 04/16/2023    1:18 PM 10/13/2021    1:06 PM  CBC  WBC 4.0 - 10.5 K/uL 7.3  7.7  7.6   Hemoglobin 12.0 - 15.0 g/dL 08.6  57.8  46.9   Hematocrit 36.0 - 46.0 % 39.4  41.1  39.6   Platelets 150 - 400 K/uL 205  216  256    Lipid Panel     Component Value Date/Time   CHOL 215 (H) 04/16/2023 1750   TRIG 204 (H) 04/16/2023 1750   HDL 47 04/16/2023 1750   CHOLHDL 4.6 04/16/2023 1750   VLDL 41 (H) 04/16/2023 1750   LDLCALC 127 (H)  04/16/2023 1750   HEMOGLOBIN A1C Lab Results  Component Value Date   HGBA1C 6.8 (H) 04/16/2023   MPG 148.46 04/16/2023   TSH Recent Labs    04/16/23 1750  TSH 7.571*   BNP (last 3 results) No results for input(s): "BNP" in the last 8760 hours. Cardiac Panel (last 3 results) Recent Labs    04/16/23 1318 04/16/23 1509  TROPONINIHS 77* 84*     Medications and allergies   Allergies  Allergen Reactions   Statins Other (See Comments)    Severe myalgia and abdominal cramps  Other Reaction(s): Arthralgias   Formaldehyde Other (See Comments)   Norvasc [Amlodipine]     Difficulty breathing    Sulfa Antibiotics Nausea Only    Other Reaction(s): GI Intolerance   Latex Rash    Occasional SOB   Penicillins Rash  angioedema     sodium chloride     sodium chloride 1 mL/kg/hr (04/17/23 0711)   heparin 1,000 Units/hr (04/16/23 1758)    Current Outpatient Medications  Medication Instructions   cyclobenzaprine (FLEXERIL) 10 mg, Oral, Daily at bedtime   DULoxetine (CYMBALTA) 60 mg, Oral, Daily   famotidine (PEPCID) 20 mg, Oral, Daily   HumaLOG KwikPen 10-25 Units, Subcutaneous, 3 times daily with meals PRN   levothyroxine (SYNTHROID) 112 mcg, Oral, Daily at bedtime   losartan (COZAAR) 50 mg, Oral, 2 times daily   Multiple Vitamins-Minerals (MULTIVITAMIN ADULTS 50+) TABS 1 tablet, Oral, Daily   naproxen sodium (ALEVE) 440 mg, Oral, 2 times daily PRN   rivaroxaban (XARELTO) 2.5 MG TABS tablet 1 tablet, Oral, Daily   Toujeo Max SoloStar 54 Units, Subcutaneous, 2 times daily   TURMERIC CURCUMIN PO 2 capsules, Oral, Daily    I/O last 3 completed shifts: In: 401.1 [P.O.:240; I.V.:161.1] Out: 350 [Urine:350] No intake/output data recorded.    Radiology:   MRA of the abdomen and bilateral lower extremity 08/11/11:  Normal lower extremity MRA, single right kidney, no evidence of renal artery stenosis.  No significant disease of the aorta.   DG Chest Port 1 View  Result  Date: 04/16/2023 CLINICAL DATA:  Chest pain EXAM: PORTABLE CHEST 1 VIEW COMPARISON:  None Available. FINDINGS: No consolidation, pneumothorax or effusion. Normal cardiopericardial silhouette without edema. Overlapping cardiac leads. IMPRESSION: No acute cardiopulmonary disease. Electronically Signed   By: Karen Kays M.D.   On: 04/16/2023 14:31    Cardiac Studies:   Regadenoson Nuclear stress test 09/02/2019: Nondiagnostic ECG stress. The heart rate response was accelerated. The baseline blood pressure was 200/116 mmHg and increased to 230/120 mmHg at peak infusion.  Resting EKG/ECG demonstrated normal sinus rhythm. Peak EKG/ECG revealed non-specific ST-T abnormality. The LV is mildly dilated both in rest and stress images.  Normal myocardial perfusion. Mild global hypokinesis. Stress LV EF is mildly dysfunctional 42%.  Findings consistent with non-ischemic cardiomyopathy.  No previous exam available for comparison. Intermediate risk study.   Echocardiogram 09/17/2021:  1. Left ventricular ejection fraction, by estimation, is 65 to 70%. The left ventricle has normal function. The left ventricle has no regional wall motion abnormalities. There is mild concentric left ventricular hypertrophy. Left ventricular diastolic  parameters are consistent with Grade I diastolic dysfunction (impaired relaxation).  2. Right ventricular systolic function is normal. The right ventricular size is normal. Tricuspid regurgitation signal is inadequate for assessing PA pressure.  3. The mitral valve is normal in structure. Trivial mitral valve regurgitation. No evidence of mitral stenosis.  4. The aortic valve is tricuspid. Aortic valve regurgitation is not visualized. Aortic valve sclerosis is present, with no evidence of aortic valve stenosis.  5. The inferior vena cava is normal in size with greater than 50% respiratory variability, suggesting right atrial pressure of 3 mmHg.  EKG:  EKG 04/16/2023: Normal sinus  rhythm at the rate of 87 bpm, left anterior fascicular block.  Poor R progression, cannot exclude anterolateral infarct 4.  LVH with repolarization abnormality.  Compared to 09/16/2021, no significant change.  Assessment   1.  Unstable angina pectoris 2.  Hypercholesterolemia, untreated with severe statin intolerance and statin myopathy 3.  Diabetes mellitus type 2 with stage IIIa chronic kidney disease, stable 4.  Primary hypertension  Recommendations:   Debra Jordan  is a 72 y.o. with type 2 diabetes with stage 3 CKD and h/o left nephrectomy for pelvic kidney in 1995,, hyperlipidemia  with severe statin intolerance with myalgia and abdominal discomfort, hypertension, hypothyroidism, history of migraines, history of gout,  chronic back pain, history of recurrent embolic toe ischemia in 2013 (left great toe and right 2nd toe) and has been on low-dose Xarelto since and hypercoagulable work up negative, echo in 2013 with bubble study negative for shunt.    Patient presently on IV heparin, continue the same.  Her troponins are minimally elevated suggestive of unstable angina pectoris.  I have started on low-dose statins, patient able to tolerate statins for short period of time, I discussed this with her.  I will consider starting on Repatha.  Diabetes fortunately is well-controlled, she is on very high doses of insulin.  Will consider addition of Mounjaro and Jardiance as well as deemed appropriate especially in view of stage IIIa chronic kidney disease.  Blood pressure is well-controlled.  In view of her multiple cardiovascular risk factors, will schedule for cardiac catheterization this afternoon. Schedule for cardiac catheterization, and possible angioplasty. We discussed regarding risks, benefits, alternatives to this including stress testing, CTA and continued medical % therapy. Patient wants to proceed. Understands <1-2% risk of death, stroke, MI, urgent CABG, bleeding, infection, 3-5 % risk  of renal failure but not limited to these.    Yates Decamp, MD, [facility] 04/17/2023, 7:39 AM Office: [phone] Fax: [phone] Pager: [phone]

## 2023-04-17 NOTE — Progress Notes (Signed)
ANTICOAGULATION CONSULT NOTE  Pharmacy Consult for heparin Indication: chest pain/ACS   Allergies  Allergen Reactions   Statins Other (See Comments)    Severe myalgia and abdominal cramps  Other Reaction(s): Arthralgias   Formaldehyde Other (See Comments)   Norvasc [Amlodipine]     Difficulty breathing    Sulfa Antibiotics Nausea Only    Other Reaction(s): GI Intolerance   Latex Rash    Occasional SOB   Penicillins Rash    angioedema    Patient Measurements: Height: 5\' 4"  (162.6 cm) Weight: 110.3 kg (243 lb 1.6 oz) IBW/kg (Calculated) : 54.7 Heparin Dosing Weight: 80kg  Vital Signs: Temp: 97.9 F (36.6 C) (08/26 0747) Temp Source: Oral (08/26 0747) BP: 148/78 (08/26 0747) Pulse Rate: 86 (08/26 0747)  Labs: Recent Labs    04/16/23 1318 04/16/23 1509 04/16/23 2236 04/17/23 0302  HGB 12.9  --   --  12.4  HCT 41.1  --   --  39.4  PLT 216  --   --  205  HEPARINUNFRC  --   --  0.33 0.20*  CREATININE 1.48*  --   --   --   TROPONINIHS 77* 84*  --   --     Estimated Creatinine Clearance: 43.6 mL/min (A) (by C-G formula based on SCr of 1.48 mg/dL (H)).  Assessment: 72 y.o. female with chest pain started on heparin drip with plans for cath today. Heparin drip rate 1000 uts/hr with heparin level 0.2 < goal but plan on turning off soon for cath - will not change drip rate at this time CBC stable, trop slightly elevated at 84, CP free currently per patient report  Goal of Therapy:  Heparin level 0.3-0.7 units/ml Monitor platelets by anticoagulation protocol: Yes   Plan:  Continue heparin drip rate 1000 uts/hr  Follow up post cath  Resume rivaroxaban 2.5mg  BID for Hx toe clot/blue toe syndrome   Leota Sauers Pharm.D. CPP, BCPS Clinical Pharmacist [phone] 04/17/2023 8:22 AM

## 2023-04-17 NOTE — Plan of Care (Signed)
°  Problem: Coping: °Goal: Level of anxiety will decrease °Outcome: Progressing °  °

## 2023-04-17 NOTE — TOC Initial Note (Signed)
Transition of Care [facility]) - Initial/Assessment Note    Patient Details  Name: Debra Jordan MRN: [id number] Date of Birth: June 14, 1951  Transition of Care [facility]) CM/SW Contact:    Leone Haven, RN Phone Number: 04/17/2023, 12:22 PM  Clinical Narrative:                 From home , her daughter lives with her, has PCP and insurance on file, states has no HH services in place at this time, she has a walker and a cane that she does not use. States she will drive herself home at dc and her two step daughter's are her support system, one lives here in [place] and one lives in [place]. States gets medications from [facility] on [address]  Pta self ambulatory .  Expected Discharge Plan: Home/Self Care Barriers to Discharge: Continued Medical Work up   Patient Goals and CMS Choice Patient states their goals for this hospitalization and ongoing recovery are:: return home   Choice offered to / list presented to : NA      Expected Discharge Plan and Services In-house Referral: NA Discharge Planning Services: CM Consult Post Acute Care Choice: NA Living arrangements for the past 2 months: Apartment                 DME Arranged: N/A DME Agency: NA       HH Arranged: NA          Prior Living Arrangements/Services Living arrangements for the past 2 months: Apartment Lives with:: Adult Children Patient language and need for interpreter reviewed:: Yes Do you feel safe going back to the place where you live?: Yes      Need for Family Participation in Patient Care: No (Comment) Care giver support system in place?: Yes (comment) Current home services: DME (walker and cane, do not use , hoping daughter can use) Criminal Activity/Legal Involvement Pertinent to Current Situation/Hospitalization: No - Comment as needed  Activities of Daily Living Home Assistive Devices/Equipment: CBG Meter ADL Screening (condition at time of admission) Patient's cognitive ability adequate to  safely complete daily activities?: Yes Is the patient deaf or have difficulty hearing?: No Does the patient have difficulty seeing, even when wearing glasses/contacts?: No Does the patient have difficulty concentrating, remembering, or making decisions?: No Patient able to express need for assistance with ADLs?: Yes Does the patient have difficulty dressing or bathing?: No Independently performs ADLs?: Yes (appropriate for developmental age) Does the patient have difficulty walking or climbing stairs?: No Weakness of Legs: None Weakness of Arms/Hands: None  Permission Sought/Granted Permission sought to share information with : Case Manager Permission granted to share information with : Yes, Verbal Permission Granted              Emotional Assessment   Attitude/Demeanor/Rapport: Engaged Affect (typically observed): Appropriate Orientation: : Oriented to Self, Oriented to Place, Oriented to  Time, Oriented to Situation Alcohol / Substance Use: Not Applicable Psych Involvement: No (comment)  Admission diagnosis:  Unstable angina pectoris (HCC) [I20.0] Nonspecific chest pain [R07.9] Patient Active Problem List   Diagnosis Date Noted   Unstable angina pectoris (HCC) 04/16/2023   Subjective memory complaints 11/07/2022   Somatic symptom disorder 11/07/2022   Lacunar infarction    ADHD (attention deficit hyperactivity disorder) 11/04/2022   Chronic kidney disease 11/04/2022   Diabetic retinopathy 11/04/2022   Fibromyalgia 11/04/2022   History of COVID-19    Family history of Alzheimer's disease 10/12/2022   Bilateral shoulder pain  05/13/2022   Gallstones without obstruction of gallbladder 10/04/2021   Pancreatic cyst 10/04/2021   Epigastric pain 09/17/2021   Elevated troponin 09/17/2021   Hypercalcemia 09/17/2021   Pain in right shoulder 08/06/2020   Osteoarthritis, hand 08/06/2020   Mass of finger of right hand 03/11/2019   Chronic fatigue, unspecified 03/03/2019   Blue  toe syndrome 10/06/2011   Type II diabetes mellitus 09/06/2011   HTN (hypertension) 09/06/2011   Hypertriglyceridemia 09/06/2011   Ischemia of extremity 09/06/2011   Hypothyroidism 09/06/2011   Gout 09/06/2011   Migraine headache 09/06/2011   History of nephrectomy, unilateral 09/06/2011   Pain in toe of right foot 09/05/2011   Pain in right hip 08/22/1972   PCP:  Creola Corn, MD Pharmacy:   [facility] [phone] [place], [place] - [address] AT [facility] [address] [facility] [address] [place] [address] Phone: [phone] Fax: [phone]     Social Determinants of Health (SDOH) Social History: SDOH Screenings   Food Insecurity: No Food Insecurity (04/16/2023)  Housing: Low Risk  (04/16/2023)  Transportation Needs: No Transportation Needs (04/16/2023)  Utilities: Not At Risk (04/16/2023)  Tobacco Use: Low Risk  (04/16/2023)   SDOH Interventions:     Readmission Risk Interventions     No data to display

## 2023-04-17 NOTE — Plan of Care (Signed)
  Problem: Education: Goal: Understanding of CV disease, CV risk reduction, and recovery process will improve Outcome: Progressing Goal: Individualized Educational Video(s) Outcome: Progressing   Problem: Activity: Goal: Ability to return to baseline activity level will improve Outcome: Progressing   Problem: Cardiovascular: Goal: Ability to achieve and maintain adequate cardiovascular perfusion will improve Outcome: Progressing Goal: Vascular access site(s) Level 0-1 will be maintained Outcome: Progressing   Problem: Health Behavior/Discharge Planning: Goal: Ability to safely manage health-related needs after discharge will improve Outcome: Progressing   Problem: Education: Goal: Understanding of cardiac disease, CV risk reduction, and recovery process will improve Outcome: Progressing Goal: Individualized Educational Video(s) Outcome: Progressing   Problem: Activity: Goal: Ability to tolerate increased activity will improve Outcome: Progressing   Problem: Cardiac: Goal: Ability to achieve and maintain adequate cardiovascular perfusion will improve Outcome: Progressing   Problem: Health Behavior/Discharge Planning: Goal: Ability to safely manage health-related needs after discharge will improve Outcome: Progressing   Problem: Education: Goal: Ability to describe self-care measures that may prevent or decrease complications (Diabetes Survival Skills Education) will improve Outcome: Progressing Goal: Individualized Educational Video(s) Outcome: Progressing   Problem: Coping: Goal: Ability to adjust to condition or change in health will improve Outcome: Progressing   Problem: Fluid Volume: Goal: Ability to maintain a balanced intake and output will improve Outcome: Progressing   Problem: Health Behavior/Discharge Planning: Goal: Ability to identify and utilize available resources and services will improve Outcome: Progressing Goal: Ability to manage health-related  needs will improve Outcome: Progressing   Problem: Metabolic: Goal: Ability to maintain appropriate glucose levels will improve Outcome: Progressing   Problem: Nutritional: Goal: Maintenance of adequate nutrition will improve Outcome: Progressing Goal: Progress toward achieving an optimal weight will improve Outcome: Progressing   Problem: Skin Integrity: Goal: Risk for impaired skin integrity will decrease Outcome: Progressing   Problem: Tissue Perfusion: Goal: Adequacy of tissue perfusion will improve Outcome: Progressing   Problem: Education: Goal: Knowledge of General Education information will improve Description: Including pain rating scale, medication(s)/side effects and non-pharmacologic comfort measures Outcome: Progressing   Problem: Health Behavior/Discharge Planning: Goal: Ability to manage health-related needs will improve Outcome: Progressing   Problem: Clinical Measurements: Goal: Ability to maintain clinical measurements within normal limits will improve Outcome: Progressing Goal: Will remain free from infection Outcome: Progressing Goal: Diagnostic test results will improve Outcome: Progressing Goal: Respiratory complications will improve Outcome: Progressing Goal: Cardiovascular complication will be avoided Outcome: Progressing   Problem: Activity: Goal: Risk for activity intolerance will decrease Outcome: Progressing   Problem: Nutrition: Goal: Adequate nutrition will be maintained Outcome: Progressing   Problem: Coping: Goal: Level of anxiety will decrease Outcome: Progressing   Problem: Elimination: Goal: Will not experience complications related to bowel motility Outcome: Progressing Goal: Will not experience complications related to urinary retention Outcome: Progressing   Problem: Pain Managment: Goal: General experience of comfort will improve Outcome: Progressing   Problem: Safety: Goal: Ability to remain free from injury will  improve Outcome: Progressing   Problem: Skin Integrity: Goal: Risk for impaired skin integrity will decrease Outcome: Progressing

## 2023-04-17 NOTE — Interval H&P Note (Signed)
History and Physical Interval Note:  04/17/2023 4:24 PM  Debra Jordan  has presented today for surgery, with the diagnosis of Unstable angina.  The various methods of treatment have been discussed with the patient and family. After consideration of risks, benefits and other options for treatment, the patient has consented to  Procedure(s) with comments: LEFT HEART CATH AND CORONARY ANGIOGRAPHY (N/A) -  possible coronary agnioplasty as a surgical intervention.  The patient's history has been reviewed, patient examined, no change in status, stable for surgery.  I have reviewed the patient's chart and labs.  Questions were answered to the patient's satisfaction.     Yates Decamp

## 2023-04-17 NOTE — Progress Notes (Signed)
ANTICOAGULATION CONSULT NOTE  Pharmacy Consult for heparin Indication: chest pain/ACS   Allergies  Allergen Reactions   Statins Other (See Comments)    Severe myalgia and abdominal cramps  Other Reaction(s): Arthralgias   Formaldehyde Other (See Comments)   Norvasc [Amlodipine]     Difficulty breathing    Sulfa Antibiotics Nausea Only    Other Reaction(s): GI Intolerance   Latex Rash    Occasional SOB   Penicillins Rash    angioedema    Patient Measurements: Height: 5\' 4"  (162.6 cm) Weight: 110.3 kg (243 lb 1.6 oz) IBW/kg (Calculated) : 54.7 Heparin Dosing Weight: 80kg  Vital Signs: Temp: 98.5 F (36.9 C) (08/26 1552) Temp Source: Oral (08/26 1552) BP: 154/83 (08/26 1745) Pulse Rate: 94 (08/26 1745)  Labs: Recent Labs    04/16/23 1318 04/16/23 1509 04/16/23 2236 04/17/23 0302  HGB 12.9  --   --  12.4  HCT 41.1  --   --  39.4  PLT 216  --   --  205  HEPARINUNFRC  --   --  0.33 0.20*  CREATININE 1.48*  --   --   --   TROPONINIHS 77* 84*  --   --     Estimated Creatinine Clearance: 43.6 mL/min (A) (by C-G formula based on SCr of 1.48 mg/dL (H)).  Assessment: 72 y.o. female with chest pain started on heparin drip s/p cath 8/26. Now pending TCTS consultation. Patient was on rivaroxaban PTA for hx of clot/blue toe syndrome, last dose 8/25 9am. Pharmacy consulted for heparin.   Ok to restart heparin 2 hrs after TR band removal. Per RN, TR band will be removed around 20:30. Patient with subtherapeutic heparin level on 1000 units/hr.  Goal of Therapy:  Heparin level 0.3-0.7 units/ml Monitor platelets by anticoagulation protocol: Yes   Plan:  Restart heparin 1150 units/hr at 22:30   Monitor daily heparin level, CBC, signs/symptoms of bleeding  F/u TCTS consult Resume rivaroxaban 2.5mg  BID for Hx toe clot/blue toe syndrome  Alphia Moh, PharmD, BCPS, BCCP Clinical Pharmacist  Please check AMION for all [facility] Pharmacy phone numbers After 10:00 PM, call Main  Pharmacy [phone]

## 2023-04-17 NOTE — Plan of Care (Signed)
  Problem: Education: Goal: Understanding of CV disease, CV risk reduction, and recovery process will improve Outcome: Progressing Goal: Individualized Educational Video(s) Outcome: Progressing   Problem: Activity: Goal: Ability to return to baseline activity level will improve Outcome: Progressing   

## 2023-04-18 ENCOUNTER — Encounter (HOSPITAL_COMMUNITY): Payer: Self-pay | Admitting: Cardiology

## 2023-04-18 ENCOUNTER — Observation Stay (HOSPITAL_COMMUNITY): Payer: Medicare Other

## 2023-04-18 ENCOUNTER — Inpatient Hospital Stay (HOSPITAL_COMMUNITY): Payer: Medicare Other

## 2023-04-18 DIAGNOSIS — E039 Hypothyroidism, unspecified: Secondary | ICD-10-CM | POA: Diagnosis present

## 2023-04-18 DIAGNOSIS — N1832 Chronic kidney disease, stage 3b: Secondary | ICD-10-CM | POA: Diagnosis present

## 2023-04-18 DIAGNOSIS — Z01810 Encounter for preprocedural cardiovascular examination: Secondary | ICD-10-CM

## 2023-04-18 DIAGNOSIS — R34 Anuria and oliguria: Secondary | ICD-10-CM | POA: Diagnosis not present

## 2023-04-18 DIAGNOSIS — Z794 Long term (current) use of insulin: Secondary | ICD-10-CM | POA: Diagnosis not present

## 2023-04-18 DIAGNOSIS — G9341 Metabolic encephalopathy: Secondary | ICD-10-CM | POA: Diagnosis not present

## 2023-04-18 DIAGNOSIS — E1122 Type 2 diabetes mellitus with diabetic chronic kidney disease: Secondary | ICD-10-CM | POA: Diagnosis present

## 2023-04-18 DIAGNOSIS — D696 Thrombocytopenia, unspecified: Secondary | ICD-10-CM | POA: Diagnosis not present

## 2023-04-18 DIAGNOSIS — Z8616 Personal history of COVID-19: Secondary | ICD-10-CM | POA: Diagnosis not present

## 2023-04-18 DIAGNOSIS — G720 Drug-induced myopathy: Secondary | ICD-10-CM | POA: Diagnosis present

## 2023-04-18 DIAGNOSIS — I739 Peripheral vascular disease, unspecified: Secondary | ICD-10-CM | POA: Diagnosis present

## 2023-04-18 DIAGNOSIS — N179 Acute kidney failure, unspecified: Secondary | ICD-10-CM | POA: Diagnosis not present

## 2023-04-18 DIAGNOSIS — I2510 Atherosclerotic heart disease of native coronary artery without angina pectoris: Secondary | ICD-10-CM

## 2023-04-18 DIAGNOSIS — E8729 Other acidosis: Secondary | ICD-10-CM | POA: Diagnosis not present

## 2023-04-18 DIAGNOSIS — R578 Other shock: Secondary | ICD-10-CM | POA: Diagnosis not present

## 2023-04-18 DIAGNOSIS — I130 Hypertensive heart and chronic kidney disease with heart failure and stage 1 through stage 4 chronic kidney disease, or unspecified chronic kidney disease: Secondary | ICD-10-CM | POA: Diagnosis present

## 2023-04-18 DIAGNOSIS — G40909 Epilepsy, unspecified, not intractable, without status epilepticus: Secondary | ICD-10-CM | POA: Diagnosis present

## 2023-04-18 DIAGNOSIS — Y92239 Unspecified place in hospital as the place of occurrence of the external cause: Secondary | ICD-10-CM | POA: Diagnosis not present

## 2023-04-18 DIAGNOSIS — I10 Essential (primary) hypertension: Secondary | ICD-10-CM | POA: Diagnosis not present

## 2023-04-18 DIAGNOSIS — E113293 Type 2 diabetes mellitus with mild nonproliferative diabetic retinopathy without macular edema, bilateral: Secondary | ICD-10-CM | POA: Diagnosis present

## 2023-04-18 DIAGNOSIS — I200 Unstable angina: Secondary | ICD-10-CM | POA: Diagnosis present

## 2023-04-18 DIAGNOSIS — I071 Rheumatic tricuspid insufficiency: Secondary | ICD-10-CM | POA: Diagnosis present

## 2023-04-18 DIAGNOSIS — K760 Fatty (change of) liver, not elsewhere classified: Secondary | ICD-10-CM | POA: Diagnosis present

## 2023-04-18 DIAGNOSIS — I214 Non-ST elevation (NSTEMI) myocardial infarction: Secondary | ICD-10-CM | POA: Diagnosis present

## 2023-04-18 DIAGNOSIS — Z6841 Body Mass Index (BMI) 40.0 and over, adult: Secondary | ICD-10-CM | POA: Diagnosis not present

## 2023-04-18 DIAGNOSIS — F05 Delirium due to known physiological condition: Secondary | ICD-10-CM | POA: Diagnosis not present

## 2023-04-18 DIAGNOSIS — I25110 Atherosclerotic heart disease of native coronary artery with unstable angina pectoris: Secondary | ICD-10-CM | POA: Diagnosis present

## 2023-04-18 DIAGNOSIS — I5032 Chronic diastolic (congestive) heart failure: Secondary | ICD-10-CM | POA: Diagnosis present

## 2023-04-18 DIAGNOSIS — Z951 Presence of aortocoronary bypass graft: Secondary | ICD-10-CM | POA: Diagnosis not present

## 2023-04-18 DIAGNOSIS — I469 Cardiac arrest, cause unspecified: Secondary | ICD-10-CM | POA: Diagnosis not present

## 2023-04-18 LAB — SURGICAL PCR SCREEN
MRSA, PCR: NEGATIVE
Staphylococcus aureus: NEGATIVE

## 2023-04-18 LAB — ECHOCARDIOGRAM COMPLETE
Area-P 1/2: 4.23 cm2
Est EF: 50
Height: 64 in
S' Lateral: 4.1 cm
Weight: 3841.3 [oz_av]

## 2023-04-18 LAB — GLUCOSE, CAPILLARY
Glucose-Capillary: 59 mg/dL — ABNORMAL LOW (ref 70–99)
Glucose-Capillary: 147 mg/dL — ABNORMAL HIGH (ref 70–99)
Glucose-Capillary: 52 mg/dL — ABNORMAL LOW (ref 70–99)
Glucose-Capillary: 111 mg/dL — ABNORMAL HIGH (ref 70–99)
Glucose-Capillary: 125 mg/dL — ABNORMAL HIGH (ref 70–99)
Glucose-Capillary: 126 mg/dL — ABNORMAL HIGH (ref 70–99)

## 2023-04-18 LAB — CBC
HCT: 41.7 % (ref 36.0–46.0)
Hemoglobin: 13.1 g/dL (ref 12.0–15.0)
MCH: 27.1 pg (ref 26.0–34.0)
MCHC: 31.4 g/dL (ref 30.0–36.0)
MCV: 86.3 fL (ref 80.0–100.0)
Platelets: 207 10*3/uL (ref 150–400)
RBC: 4.83 MIL/uL (ref 3.87–5.11)
RDW: 14.9 % (ref 11.5–15.5)
WBC: 9 10*3/uL (ref 4.0–10.5)
nRBC: 0 % (ref 0.0–0.2)

## 2023-04-18 LAB — ABO/RH: ABO/RH(D): B NEG

## 2023-04-18 LAB — HEPARIN LEVEL (UNFRACTIONATED)
Heparin Unfractionated: 0.16 [IU]/mL — ABNORMAL LOW (ref 0.30–0.70)
Heparin Unfractionated: 0.16 [IU]/mL — ABNORMAL LOW (ref 0.30–0.70)

## 2023-04-18 MED ORDER — TRANEXAMIC ACID (OHS) PUMP PRIME SOLUTION
2.0000 mg/kg | INTRAVENOUS | Status: DC
  Filled 2023-04-18: qty 2.18

## 2023-04-18 MED ORDER — DEXTROSE 50 % IV SOLN: 1.0000 | Freq: Once | INTRAVENOUS | Status: AC

## 2023-04-18 MED ORDER — EPINEPHRINE HCL 5 MG/250ML IV SOLN IN NS
0.0000 ug/min | INTRAVENOUS | Status: DC
  Filled 2023-04-18: qty 250

## 2023-04-18 MED ORDER — TEMAZEPAM 7.5 MG PO CAPS
15.0000 mg | ORAL_CAPSULE | Freq: Once | ORAL | Status: AC | PRN
  Administered 2023-04-18: 15 mg via ORAL
  Filled 2023-04-18: qty 2

## 2023-04-18 MED ORDER — BISACODYL 5 MG PO TBEC
5.0000 mg | DELAYED_RELEASE_TABLET | Freq: Once | ORAL | Status: AC
  Administered 2023-04-18: 5 mg via ORAL
  Filled 2023-04-18: qty 1

## 2023-04-18 MED ORDER — TRANEXAMIC ACID (OHS) BOLUS VIA INFUSION
15.0000 mg/kg | INTRAVENOUS | Status: AC
  Administered 2023-04-19: 1633.5 mg via INTRAVENOUS
  Filled 2023-04-18: qty 1634

## 2023-04-18 MED ORDER — INSULIN REGULAR(HUMAN) IN NACL 100-0.9 UT/100ML-% IV SOLN
INTRAVENOUS | Status: AC
  Administered 2023-04-19: 3.2 [IU]/h via INTRAVENOUS
  Filled 2023-04-18: qty 100

## 2023-04-18 MED ORDER — CHLORHEXIDINE GLUCONATE CLOTH 2 % EX PADS
6.0000 | MEDICATED_PAD | Freq: Once | CUTANEOUS | Status: AC
  Administered 2023-04-19: 6 via TOPICAL

## 2023-04-18 MED ORDER — INSULIN GLARGINE-YFGN 100 UNIT/ML ~~LOC~~ SOLN
25.0000 [IU] | Freq: Every day | SUBCUTANEOUS | Status: DC
  Filled 2023-04-18: qty 0.25

## 2023-04-18 MED ORDER — MILRINONE LACTATE IN DEXTROSE 20-5 MG/100ML-% IV SOLN
0.3000 ug/kg/min | INTRAVENOUS | Status: DC
  Filled 2023-04-18: qty 100

## 2023-04-18 MED ORDER — PLASMA-LYTE A IV SOLN
INTRAVENOUS | Status: DC
  Filled 2023-04-18: qty 2.5

## 2023-04-18 MED ORDER — DEXMEDETOMIDINE HCL IN NACL 400 MCG/100ML IV SOLN
0.1000 ug/kg/h | INTRAVENOUS | Status: AC
  Administered 2023-04-19: .7 ug/kg/h via INTRAVENOUS
  Filled 2023-04-18: qty 100

## 2023-04-18 MED ORDER — NOREPINEPHRINE 4 MG/250ML-% IV SOLN
0.0000 ug/min | INTRAVENOUS | Status: DC
  Filled 2023-04-18: qty 250

## 2023-04-18 MED ORDER — PHENYLEPHRINE HCL-NACL 20-0.9 MG/250ML-% IV SOLN
30.0000 ug/min | INTRAVENOUS | Status: AC
  Administered 2023-04-19: 20 ug/min via INTRAVENOUS
  Filled 2023-04-18 (×2): qty 250

## 2023-04-18 MED ORDER — DEXTROSE 50 % IV SOLN
INTRAVENOUS | Status: AC
  Administered 2023-04-18: 50 mL via INTRAVENOUS
  Filled 2023-04-18: qty 50

## 2023-04-18 MED ORDER — CEFAZOLIN SODIUM-DEXTROSE 2-4 GM/100ML-% IV SOLN
2.0000 g | INTRAVENOUS | Status: DC
  Filled 2023-04-18: qty 100

## 2023-04-18 MED ORDER — METOPROLOL TARTRATE 12.5 MG HALF TABLET
12.5000 mg | ORAL_TABLET | Freq: Once | ORAL | Status: AC
  Administered 2023-04-19: 12.5 mg via ORAL
  Filled 2023-04-18: qty 1

## 2023-04-18 MED ORDER — HEPARIN 30,000 UNITS/1000 ML (OHS) CELLSAVER SOLUTION
Status: DC
  Filled 2023-04-18: qty 1000

## 2023-04-18 MED ORDER — CHLORHEXIDINE GLUCONATE 0.12 % MT SOLN
15.0000 mL | Freq: Once | OROMUCOSAL | Status: AC
  Administered 2023-04-19: 15 mL via OROMUCOSAL
  Filled 2023-04-18: qty 15

## 2023-04-18 MED ORDER — TRANEXAMIC ACID 1000 MG/10ML IV SOLN
1.5000 mg/kg/h | INTRAVENOUS | Status: AC
  Administered 2023-04-19: 1.5 mg/kg/h via INTRAVENOUS
  Filled 2023-04-18: qty 25

## 2023-04-18 MED ORDER — VANCOMYCIN HCL 1500 MG/300ML IV SOLN
1500.0000 mg | INTRAVENOUS | Status: AC
  Administered 2023-04-19: 1500 mg via INTRAVENOUS
  Filled 2023-04-18 (×2): qty 300

## 2023-04-18 MED ORDER — INSULIN ASPART 100 UNIT/ML IJ SOLN
0.0000 [IU] | Freq: Three times a day (TID) | INTRAMUSCULAR | Status: DC
  Administered 2023-04-18: 1 [IU] via SUBCUTANEOUS

## 2023-04-18 MED ORDER — MANNITOL 20 % IV SOLN
INTRAVENOUS | Status: DC
  Filled 2023-04-18: qty 13

## 2023-04-18 MED ORDER — POTASSIUM CHLORIDE 2 MEQ/ML IV SOLN
80.0000 meq | INTRAVENOUS | Status: DC
  Filled 2023-04-18: qty 40

## 2023-04-18 MED ORDER — NITROGLYCERIN IN D5W 200-5 MCG/ML-% IV SOLN
2.0000 ug/min | INTRAVENOUS | Status: DC
  Filled 2023-04-18: qty 250

## 2023-04-18 NOTE — Progress Notes (Signed)
Subjective:  Episode of chest pain this morning, currently chest pain-free   Current Facility-Administered Medications:    0.9 %  sodium chloride infusion, 250 mL, Intravenous, PRN, Yates Decamp, MD   acetaminophen (TYLENOL) tablet 650 mg, 650 mg, Oral, Q4H PRN, Yates Decamp, MD   aspirin EC tablet 81 mg, 81 mg, Oral, Daily, Yates Decamp, MD   atorvastatin (LIPITOR) tablet 10 mg, 10 mg, Oral, Daily, Yates Decamp, MD, 10 mg at 04/17/23 1011   cyclobenzaprine (FLEXERIL) tablet 10 mg, 10 mg, Oral, QHS, Yates Decamp, MD, 10 mg at 04/17/23 2133   donepezil (ARICEPT) tablet 5 mg, 5 mg, Oral, Daily, Yates Decamp, MD   ezetimibe (ZETIA) tablet 10 mg, 10 mg, Oral, Daily, Yates Decamp, MD, 10 mg at 04/17/23 2133   heparin ADULT infusion 100 units/mL (25000 units/256mL), 1,250 Units/hr, Intravenous, Continuous, Yates Decamp, MD, Last Rate: 11.5 mL/hr at 04/17/23 2301, 1,150 Units/hr at 04/17/23 2301   insulin aspart (novoLOG) injection 0-20 Units, 0-20 Units, Subcutaneous, TID WC, Yates Decamp, MD, 3 Units at 04/16/23 1802   insulin aspart (novoLOG) injection 6 Units, 6 Units, Subcutaneous, TID WC, Yates Decamp, MD   insulin glargine-yfgn (SEMGLEE) injection 54 Units, 54 Units, Subcutaneous, BID, Yates Decamp, MD, 54 Units at 04/17/23 2134   levothyroxine (SYNTHROID) tablet 112 mcg, 112 mcg, Oral, Daily, Yates Decamp, MD, 112 mcg at 04/18/23 0618   losartan (COZAAR) tablet 50 mg, 50 mg, Oral, BID, Yates Decamp, MD, 50 mg at 04/17/23 2133   metoprolol tartrate (LOPRESSOR) tablet 50 mg, 50 mg, Oral, BID, Yates Decamp, MD, 50 mg at 04/17/23 2133   ondansetron (ZOFRAN) injection 4 mg, 4 mg, Intravenous, Q6H PRN, Yates Decamp, MD   sodium chloride flush (NS) 0.9 % injection 3 mL, 3 mL, Intravenous, Q12H, Yates Decamp, MD, 3 mL at 04/17/23 2134   sodium chloride flush (NS) 0.9 % injection 3 mL, 3 mL, Intravenous, Q12H, Yates Decamp, MD, 3 mL at 04/17/23 2134   sodium chloride flush (NS) 0.9 % injection 3 mL, 3 mL, Intravenous, PRN, Yates Decamp,  MD   Objective:  Vital Signs in the last 24 hours: Temp:  [98.4 F (36.9 C)-99.1 F (37.3 C)] 99.1 F (37.3 C) (08/27 0739) Pulse Rate:  [66-101] 78 (08/27 0739) Resp:  [15-21] 17 (08/27 0739) BP: (123-186)/(65-99) 130/81 (08/27 0739) SpO2:  [94 %-99 %] 95 % (08/27 0739) Weight:  [108.9 kg] 108.9 kg (08/27 0451)  Intake/Output from previous day: 08/26 0701 - 08/27 0700 In: -  Out: 1450 [Urine:1450]  Physical Exam Vitals and nursing note reviewed.  Constitutional:      General: She is not in acute distress. Neck:     Vascular: No JVD.  Cardiovascular:     Rate and Rhythm: Normal rate and regular rhythm.     Heart sounds: Normal heart sounds. No murmur heard. Pulmonary:     Effort: Pulmonary effort is normal.     Breath sounds: Normal breath sounds. No wheezing or rales.  Musculoskeletal:     Right lower leg: No edema.     Left lower leg: No edema.      Imaging/tests reviewed and independently interpreted:  Chest x-ray 04/16/2023: No acute cardiopulmonary disease.   Cardiac Studies:  Telemetry 04/18/2023: No significant arrhythmia  EKG 04/16/2023: Sinus rhythm Left anterior fascicular block LVH with secondary repolarization abnormality Anterior Q waves, possibly due to LVH  Echocardiogram 04/18/2023:  1. Left ventricular ejection fraction, by estimation, is 50%. The left  ventricle has mildly decreased  function. The left ventricle demonstrates  regional wall motion abnormalities (see scoring diagram/findings for  description). Left ventricular diastolic   parameters are consistent with Grade II diastolic dysfunction  (pseudonormalization). Apical and septal hypokinesis; anterior wall not  well visualized.   2. Right ventricular systolic function is normal. The right ventricular  size is normal. Tricuspid regurgitation signal is inadequate for assessing  PA pressure.   3. Left atrial size was mildly dilated.   4. The mitral valve is normal in structure.  Trivial mitral valve  regurgitation. No evidence of mitral stenosis.   5. The aortic valve was not well visualized. Aortic valve regurgitation  is not visualized.   6. The inferior vena cava is normal in size with greater than 50%  respiratory variability, suggesting right atrial pressure of 3 mmHg.   Comparison(s): Prior images reviewed side by side. LVEF has decreased from  prior.   Left Heart Catheterization 04/17/23:  Hemodynamic data: LV 163/9, EDP 18 mmHg.  EDP mildly elevated.  Ao 152/79, mean 112 mmHg.  No pressure gradient across the aortic valve. RCA: Small, nondominant. LM: Distal left main is about 70% stenosed. LCx: Very large caliber vessel and a dominant vessel.  Gives origin to large OM1 which has a proximal long segment ulcerated 70 to 80% stenosis.  There is a moderate-sized OM 2 with mild diffuse disease.  Distally gives origin to PL and PDA branches with mild disease. LAD: It is a moderate caliber vessel with severe disease from the ostium all the way to the proximal to mid segment.  Gives origin to 2 moderate-sized D1 in the proximal to mid segment and several smaller diagonals in the mid to distal segment.  Proximal LAD is diffusely diseased by about 70% followed by a focal 95% stenosis just before the origin of D1.      Impression: Severe distal left main stenosis of 70 to 80% followed by proximal LAD diffuse 70 to 80% followed by a 99% focal stenosis, moderate to large sized OM1 with a ulcerated 80% stenosis and there is mild diffuse disease in the rest of the vessels.  Given diabetic state, patient will benefit from CABG and surgical consultation will be obtained.    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 CVTS consulted.  Awaiting their recommendations. Continue aspirin, heparin. Known severe statin intolerance, currently tolerating Lipitor 10 mg daily okay.  Will need to consider PCSK9 inhibitor outpatient.  Continue Zetia. Continue losartan 50 mg daily, metoprolol tartrate 50 mg twice daily.  Type II DM: Continue insulin     Elder Negus, MD Pager: [phone] Office: [phone]

## 2023-04-18 NOTE — Progress Notes (Signed)
ANTICOAGULATION CONSULT NOTE  Pharmacy Consult for heparin Indication: chest pain/ACS   Allergies  Allergen Reactions   Statins Other (See Comments)    Severe myalgia and abdominal cramps  Other Reaction(s): Arthralgias   Formaldehyde Other (See Comments)   Norvasc [Amlodipine]     Difficulty breathing    Sulfa Antibiotics Nausea Only    Other Reaction(s): GI Intolerance   Latex Rash    Occasional SOB   Penicillins Rash    angioedema    Patient Measurements: Height: 5\' 4"  (162.6 cm) Weight: 108.9 kg (240 lb 1.3 oz) IBW/kg (Calculated) : 54.7 Heparin Dosing Weight: 80kg  Vital Signs: Temp: 99.5 F (37.5 C) (08/27 1522) Temp Source: Oral (08/27 1522) BP: 130/65 (08/27 1522) Pulse Rate: 73 (08/27 1522)  Labs: Recent Labs    04/16/23 1318 04/16/23 1509 04/16/23 2236 04/17/23 0302 04/18/23 0543 04/18/23 1603  HGB 12.9  --   --  12.4 13.1  --   HCT 41.1  --   --  39.4 41.7  --   PLT 216  --   --  205 207  --   HEPARINUNFRC  --   --    < > 0.20* 0.16* 0.16*  CREATININE 1.48*  --   --   --   --   --   TROPONINIHS 77* 84*  --   --   --   --    < > = values in this interval not displayed.    Estimated Creatinine Clearance: 43.3 mL/min (A) (by C-G formula based on SCr of 1.48 mg/dL (H)).  Assessment: 72 y.o. female with chest pain started on heparin drip s/p cath 8/26. Now pending TCTS consultation. Patient was on rivaroxaban PTA for hx of clot/blue toe syndrome, last dose 8/25 9am. Pharmacy consulted for heparin.   Heparin level subtherapeutic (0.16) on infusion at 1250 units/hr. No issues with line or bleeding reported per RN. Noted plans for CABG 8/28.  Goal of Therapy:  Heparin level 0.3-0.7 units/ml Monitor platelets by anticoagulation protocol: Yes   Plan:  Increase heparin to 1450 units/hr   Heparin level in 6 hours  Christoper Fabian, PharmD, BCPS Please see amion for complete clinical pharmacist phone list 04/18/2023 5:34 PM

## 2023-04-18 NOTE — Progress Notes (Signed)
ANTICOAGULATION CONSULT NOTE  Pharmacy Consult for heparin Indication: chest pain/ACS   Allergies  Allergen Reactions   Statins Other (See Comments)    Severe myalgia and abdominal cramps  Other Reaction(s): Arthralgias   Formaldehyde Other (See Comments)   Norvasc [Amlodipine]     Difficulty breathing    Sulfa Antibiotics Nausea Only    Other Reaction(s): GI Intolerance   Latex Rash    Occasional SOB   Penicillins Rash    angioedema    Patient Measurements: Height: 5\' 4"  (162.6 cm) Weight: 108.9 kg (240 lb 1.3 oz) IBW/kg (Calculated) : 54.7 Heparin Dosing Weight: 80kg  Vital Signs: Temp: 99.1 F (37.3 C) (08/27 0739) Temp Source: Oral (08/27 0739) BP: 130/81 (08/27 0739) Pulse Rate: 78 (08/27 0739)  Labs: Recent Labs    04/16/23 1318 04/16/23 1509 04/16/23 2236 04/17/23 0302 04/18/23 0543  HGB 12.9  --   --  12.4 13.1  HCT 41.1  --   --  39.4 41.7  PLT 216  --   --  205 207  HEPARINUNFRC  --   --  0.33 0.20* 0.16*  CREATININE 1.48*  --   --   --   --   TROPONINIHS 77* 84*  --   --   --     Estimated Creatinine Clearance: 43.3 mL/min (A) (by C-G formula based on SCr of 1.48 mg/dL (H)).  Assessment: 72 y.o. female with chest pain started on heparin drip s/p cath 8/26. Now pending TCTS consultation. Patient was on rivaroxaban PTA for hx of clot/blue toe syndrome, last dose 8/25 9am. Pharmacy consulted for heparin.   Ok to restart heparin 2 hrs after TR band removal. Per RN, TR band will be removed around 20:30. Heparin drip restarted at 1150 uts/hr with heparin level 0.16 this am < goal CBC stable   Goal of Therapy:  Heparin level 0.3-0.7 units/ml Monitor platelets by anticoagulation protocol: Yes   Plan:  Increase heparin 1250 units/hr   Draw heparin level 6hr after rate increase  Monitor daily heparin level, CBC, signs/symptoms of bleeding  F/u TCTS consult   Leota Sauers Pharm.D. CPP, BCPS Clinical Pharmacist [phone] 04/18/2023 8:23  AM   Please check AMION for all [facility] Pharmacy phone numbers After 10:00 PM, call Main Pharmacy [phone]

## 2023-04-18 NOTE — Plan of Care (Signed)
°  Problem: Activity: °Goal: Ability to return to baseline activity level will improve °Outcome: Progressing °  °Problem: Cardiovascular: °Goal: Ability to achieve and maintain adequate cardiovascular perfusion will improve °Outcome: Progressing °  °

## 2023-04-18 NOTE — Progress Notes (Signed)
Pre-CABG study completed.  ° °Please see CV Proc for preliminary results.  ° °Rachel Hodge, RDMS, RVT ° °

## 2023-04-18 NOTE — Consult Note (Addendum)
[address]       [place] [address]             [phone]        [name] [facility] Medical Record #[id number] Date of Birth: 1951-01-02  Referring: Yates Decamp, MD, [facility]  Primary Care: Creola Corn, MD   Reason for consult:  Evaluation for potential surgical management of multivessel coronary artery disease      History of Present Illness:     [name] is a 72 year old female with a past history notable for well-controlled type 2 diabetes mellitus, hypertension, chronic kidney disease stage III status post left nephrectomy for pelvic kidney in 1995 and dyslipidemia with significant statin intolerance.  She was visiting her daughter who has special needs and who is currently being treated for sepsis in the [facility] ICU last evening when she developed chest discomfort radiating to her right neck.  She was encouraged to go to the emergency room for evaluation.  She reported having similar pain off and on for the past 2 weeks.  EKG obtained in the ED showed normal sinus rhythm with a left fascicular block and anterior Q waves.  Initial high-sensitivity troponin was 77 and later rose to 84.  Chest x-ray showed no acute findings.  Debra Jordan was started on a heparin drip and was admitted to the hospital by the cardiology service.  She remained stable with no further chest pain.  She was taken to the Cath Lab this morning where left heart catheterization demonstrated severe multivessel coronary artery disease.  There was a 70% distal left main coronary artery stenosis followed by proximal 80% and 99% tandem lesions in the proximal LAD.  The first obtuse marginal had a long 80% stenosis.  We were asked by Dr. Jacinto Halim to see Debra Jordan for possible surgical coronary revascularization. Debra Jordan is currently resting in bed with heparin infusing.  She states she continues to have brief episodes of chest discomfort that come and go.  She is the full-time caregiver for  her 10 year old daughter who has developmental cognitive impairment and who is an amputee.  Small's has some arthritis in her hips and back but states that although she tires easily, this does not limit her mobility.   Current Activity/ Functional Status: Patient is independent with mobility/ambulation, transfers, ADL's, IADL's.   Zubrod Score: At the time of surgery this patient's most appropriate activity status/level should be described as: []     0    Normal activity, no symptoms []     1    Restricted in physical strenuous activity but ambulatory, able to do out light work []     2    Ambulatory and capable of self care, unable to do work activities, up and about                 more than 50%  Of the time                            []     3    Only limited self care, in bed greater than 50% of waking hours []     4    Completely disabled, no self care, confined to bed or chair []     5    Moribund  Past Medical History:  Diagnosis Date   ADHD (attention deficit hyperactivity disorder)    Arthritis  gout   Bilateral shoulder pain 05/13/2022   Blue toe syndrome 10/06/2011   Broken shoulder    And tailbone    Bursitis    Hip and Shoulder per patient   Cataract    Mixed form OU   Chronic fatigue, unspecified 03/03/2019   Chronic kidney disease    hx of left nephrectomy   Diabetic retinopathy    NPDR OU   Dyspnea    Since having COVID   Elevated troponin 09/17/2021   Epigastric pain 09/17/2021   Family history of Alzheimer's disease 10/12/2022   Fatty liver    Fibromyalgia    Gallstones without obstruction of gallbladder 10/04/2021   Gout 09/06/2011   History of COVID-19 03/03/2019   History of headache    History of kidney stones    History of nephrectomy, unilateral 09/06/2011   History of PCOS    History of skin cancer    Skin Cancer   History of venous thrombosis and embolism    Left great toe   HTN (hypertension) 09/06/2011   Hypercalcemia 09/17/2021    Hyperlipidemia    Hypersomnolence    Hypertensive retinopathy    OU   Hypertriglyceridemia 09/06/2011   Hypothyroidism 09/06/2011   Ischemia of extremity 09/06/2011   Lacunar infarction    left periatrial white matter   Mass of finger of right hand 03/11/2019   Migraine headache 09/06/2011   Neuropathy    feet bilateral   Obesity    Osteoarthritis, hand 08/06/2020   Pain in right hip 08/22/1972   Pain in right shoulder 08/06/2020   Pain in toe of right foot 09/05/2011   Pancreatic cyst 10/04/2021   Pneumonia    Seizure disorder    Somatic symptom disorder 11/07/2022   Subjective memory complaints 11/07/2022   Trouble swallowing    Type II diabetes mellitus 09/06/2011    Past Surgical History:  Procedure Laterality Date   ABDOMINAL HYSTERECTOMY     CHOLECYSTECTOMY N/A 10/22/2021   Procedure: LAPAROSCOPIC CHOLECYSTECTOMY;  Surgeon: Fritzi Mandes, MD;  Location: [facility] OR;  Service: General;  Laterality: N/A;   HERNIA REPAIR     LEFT HEART CATH AND CORONARY ANGIOGRAPHY N/A 04/17/2023   Procedure: LEFT HEART CATH AND CORONARY ANGIOGRAPHY;  Surgeon: Yates Decamp, MD;  Location: [facility] INVASIVE CV LAB;  Service: Cardiovascular;  Laterality: N/A;  7:30 case if possible   LYMPH NODE BIOPSY     NASAL SINUS SURGERY     NEPHRECTOMY Left 2002   left nephrectomy   TONSILLECTOMY  1969   TUBAL LIGATION      Social History   Tobacco Use  Smoking Status Never  Smokeless Tobacco Never    Social History   Substance and Sexual Activity  Alcohol Use No     Allergies  Allergen Reactions   Statins Other (See Comments)    Severe myalgia and abdominal cramps  Other Reaction(s): Arthralgias   Formaldehyde Other (See Comments)   Norvasc [Amlodipine]     Difficulty breathing    Sulfa Antibiotics Nausea Only    Other Reaction(s): GI Intolerance   Latex Rash    Occasional SOB   Penicillins Rash    angioedema    Current Facility-Administered Medications  Medication Dose Route  Frequency Provider Last Rate Last Admin   0.9 %  sodium chloride infusion  250 mL Intravenous PRN Yates Decamp, MD       acetaminophen (TYLENOL) tablet 650 mg  650 mg Oral Q4H PRN Yates Decamp, MD  aspirin EC tablet 81 mg  81 mg Oral Daily Yates Decamp, MD       atorvastatin (LIPITOR) tablet 10 mg  10 mg Oral Daily Yates Decamp, MD   10 mg at 04/17/23 1011   cyclobenzaprine (FLEXERIL) tablet 10 mg  10 mg Oral QHS Yates Decamp, MD   10 mg at 04/17/23 2133   donepezil (ARICEPT) tablet 5 mg  5 mg Oral Daily Yates Decamp, MD       ezetimibe (ZETIA) tablet 10 mg  10 mg Oral Daily Yates Decamp, MD   10 mg at 04/17/23 2133   heparin ADULT infusion 100 units/mL (25000 units/239mL)  1,250 Units/hr Intravenous Continuous Yates Decamp, MD 11.5 mL/hr at 04/17/23 2301 1,150 Units/hr at 04/17/23 2301   insulin aspart (novoLOG) injection 0-20 Units  0-20 Units Subcutaneous TID WC Yates Decamp, MD   3 Units at 04/16/23 1802   insulin aspart (novoLOG) injection 6 Units  6 Units Subcutaneous TID WC Yates Decamp, MD       insulin glargine-yfgn (SEMGLEE) injection 54 Units  54 Units Subcutaneous BID Yates Decamp, MD   54 Units at 04/17/23 2134   levothyroxine (SYNTHROID) tablet 112 mcg  112 mcg Oral Daily Yates Decamp, MD   112 mcg at 04/18/23 0618   losartan (COZAAR) tablet 50 mg  50 mg Oral BID Yates Decamp, MD   50 mg at 04/17/23 2133   metoprolol tartrate (LOPRESSOR) tablet 50 mg  50 mg Oral BID Yates Decamp, MD   50 mg at 04/17/23 2133   ondansetron (ZOFRAN) injection 4 mg  4 mg Intravenous Q6H PRN Yates Decamp, MD       sodium chloride flush (NS) 0.9 % injection 3 mL  3 mL Intravenous Q12H Yates Decamp, MD   3 mL at 04/17/23 2134   sodium chloride flush (NS) 0.9 % injection 3 mL  3 mL Intravenous Q12H Yates Decamp, MD   3 mL at 04/17/23 2134   sodium chloride flush (NS) 0.9 % injection 3 mL  3 mL Intravenous PRN Yates Decamp, MD        Medications Prior to Admission  Medication Sig Dispense Refill Last Dose   cyclobenzaprine (FLEXERIL)  10 MG tablet Take 10 mg by mouth at bedtime.   04/15/2023   DULoxetine (CYMBALTA) 60 MG capsule Take 60 mg by mouth daily.   04/16/2023   famotidine (PEPCID) 20 MG tablet Take 20 mg by mouth daily.   Past Week   insulin glargine, 2 Unit Dial, (TOUJEO MAX SOLOSTAR) 300 UNIT/ML Solostar Pen Inject 54 Units into the skin 2 (two) times daily.   04/15/2023   insulin lispro (HUMALOG KWIKPEN) 200 UNIT/ML KwikPen Inject 10-25 Units into the skin 3 (three) times daily with meals as needed (high blood sugar).   04/15/2023   levothyroxine (SYNTHROID) 112 MCG tablet Take 112 mcg by mouth at bedtime.   04/16/2023   losartan (COZAAR) 50 MG tablet Take 50 mg by mouth 2 (two) times daily.   04/16/2023   Multiple Vitamins-Minerals (MULTIVITAMIN ADULTS 50+) TABS Take 1 tablet by mouth daily.   04/16/2023   naproxen sodium (ALEVE) 220 MG tablet Take 440 mg by mouth 2 (two) times daily as needed (pain).   04/15/2023   rivaroxaban (XARELTO) 2.5 MG TABS tablet Take 1 tablet by mouth daily.   04/16/2023 at 09:00 am   TURMERIC CURCUMIN PO Take 2 capsules by mouth daily.   04/16/2023    Family History  Problem Relation Age of  Onset   Other Mother        blood clot history, varicose veins   Stroke Mother    Diabetes Mother    Cancer Father    Healthy Brother    Healthy Brother    Healthy Brother    Stroke Maternal Grandmother    Diabetes Daughter    Dementia Other    Alzheimer's disease Other        several family members stemming from intermarriage     Review of Systems:       Cardiac Review of Systems: Y or  [    ]= no  Chest Pain [  x  ]  Resting SOB [   ] Exertional SOB  [ x ]  Orthopnea [  ]   Pedal Edema [   ]    Palpitations [  ] Syncope  [  ]   Presyncope [   ]  General Review of Systems: [Y] = yes [  ]=no Constitional: recent weight change [  ]; anorexia [  ]; fatigue [  ]; nausea [  ]; night sweats [  ]; fever [  ]; or chills [  ]                                                               Dental:  Last Dentist visit: 2 years ago  Eye : blurred vision [  ]; diplopia [   ]; vision changes [  ];  Amaurosis fugax[  ]; Resp: cough [  ];  wheezing[  ];  hemoptysis[  ]; shortness of breath[  ]; paroxysmal nocturnal dyspnea[  ]; dyspnea on exertion[ x ]; or orthopnea[  ];  GI:  gallstones[  ], vomiting[  ];  dysphagia[  ]; melena[  ];  hematochezia [  ]; heartburn[  ];   Hx of  Colonoscopy[  ]; GU: kidney stones [  ]; hematuria[  ];   dysuria [  ];  nocturia[  ];  history of     obstruction [  ]; urinary frequency [  ]             Skin: rash, swelling[  ];, hair loss[  ];  peripheral edema[  ];  or itching[  ]; Musculosketetal: myalgias[  ];  joint swelling[  ];  joint erythema[  ];  joint pain[ x ];  back pain[ x ];  Heme/Lymph: bruising[  ];  bleeding[  ];  anemia[  ];  Neuro: TIA[  ];  headaches[  ];  stroke[  ];  vertigo[  ];  seizures[  ];   paresthesias[  ];  difficulty walking[  ];  Psych:depression[  ]; anxiety[  ];  Endocrine: diabetes[ x ];  thyroid dysfunction[  ];               Physical Exam: BP 130/81 (BP Location: Left Arm)   Pulse 78   Temp 99.1 F (37.3 C) (Oral)   Resp 17   Ht 5\' 4"  (1.626 m)   Wt 108.9 kg   SpO2 95%   BMI 41.21 kg/m    General appearance: alert, cooperative, and no distress Head: Normocephalic, without obvious abnormality, atraumatic Neck: no adenopathy, no carotid bruit, no JVD, supple, symmetrical, trachea midline, and  thyroid not enlarged, symmetric, no tenderness/mass/nodules Lymph nodes: No cervical or clavicular adenopathy Resp: Breath sounds full and clear to auscultation Cardio: Regular rate and rhythm, no murmur GI: Soft, nontender, no palpable mass.  Normoactive bowel sounds Extremities: All warm and well-perfused with palpable distal pulses in all extremities.  No significant varicosities in the lower extremities Neurologic: Grossly normal  Diagnostic Studies & Laboratory data:    LEFT HEART CATH AND CORONARY ANGIOGRAPHY    Conclusion  Left Heart Catheterization 04/17/23:  Hemodynamic data: LV 163/9, EDP 18 mmHg.  EDP mildly elevated.  Ao 152/79, mean 112 mmHg.  No pressure gradient across the aortic valve. RCA: Small, nondominant. LM: Distal left main is about 70% stenosed. LCx: Very large caliber vessel and a dominant vessel.  Gives origin to large OM1 which has a proximal long segment ulcerated 70 to 80% stenosis.  There is a moderate-sized OM 2 with mild diffuse disease.  Distally gives origin to PL and PDA branches with mild disease. LAD: It is a moderate caliber vessel with severe disease from the ostium all the way to the proximal to mid segment.  Gives origin to 2 moderate-sized D1 in the proximal to mid segment and several smaller diagonals in the mid to distal segment.  Proximal LAD is diffusely diseased by about 70% followed by a focal 95% stenosis just before the origin of D1.      Impression: Severe distal left main stenosis of 70 to 80% followed by proximal LAD diffuse 70 to 80% followed by a 99% focal stenosis, moderate to large sized OM1 with a ulcerated 80% stenosis and there is mild diffuse disease in the rest of the vessels.  Given diabetic state, patient will benefit from CABG and surgical consultation will be obtained.  ECHOCARDIOGRAM REPORT    Patient Name:   Debra Jordan Date of Exam: 04/18/2023  Medical Rec #:  [id number]      Height:       64.0 in  Accession #:    [id number]     Weight:       240.1 lb  Date of Birth:  Dec 23, 1950      BSA:          2.114 m  Patient Age:    71 years       BP:           130/81 mmHg  Patient Gender: F              HR:           82 bpm.  Exam Location:  Inpatient   Procedure: 2D Echo, Cardiac Doppler and Color Doppler   Indications:     CAD Native Vessel I25.10    History:         Patient has prior history of Echocardiogram examinations,  most                  recent 09/17/2021. Risk Factors:Diabetes, Dyslipidemia and                   Hypertension.     Sonographer:     Harriette Bouillon RDCS  Referring Phys:  [id number] Yates Decamp  Diagnosing Phys: Riley Lam MD   IMPRESSIONS     1. Left ventricular ejection fraction, by estimation, is 50%. The left  ventricle has mildly decreased function. The left ventricle demonstrates  regional wall motion abnormalities (see scoring diagram/findings for  description). Left ventricular diastolic   parameters are  consistent with Grade II diastolic dysfunction  (pseudonormalization). Apical and septal hypokinesis; anterior wall not  well visualized.   2. Right ventricular systolic function is normal. The right ventricular  size is normal. Tricuspid regurgitation signal is inadequate for assessing  PA pressure.   3. Left atrial size was mildly dilated.   4. The mitral valve is normal in structure. Trivial mitral valve  regurgitation. No evidence of mitral stenosis.   5. The aortic valve was not well visualized. Aortic valve regurgitation  is not visualized.   6. The inferior vena cava is normal in size with greater than 50%  respiratory variability, suggesting right atrial pressure of 3 mmHg.   Comparison(s): Prior images reviewed side by side. LVEF has decreased from  prior.   FINDINGS   Left Ventricle: Left ventricular ejection fraction, by estimation, is  50%. The left ventricle has mildly decreased function. The left ventricle  demonstrates regional wall motion abnormalities. The left ventricular  internal cavity size was normal in  size. There is no left ventricular hypertrophy. Left ventricular diastolic  parameters are consistent with Grade II diastolic dysfunction  (pseudonormalization).     LV Wall Scoring:  The mid and distal anterior septum and apical inferior segment are  hypokinetic. Apical and septal hypokinesis; anterior wall not well  visualized.   Right Ventricle: The right ventricular size is normal. No increase in  right ventricular wall thickness. Right  ventricular systolic function is  normal. Tricuspid regurgitation signal is inadequate for assessing PA  pressure.   Left Atrium: Left atrial size was mildly dilated.   Right Atrium: Right atrial size was normal in size.   Pericardium: There is no evidence of pericardial effusion.   Mitral Valve: The mitral valve is normal in structure. Trivial mitral  valve regurgitation. No evidence of mitral valve stenosis.   Tricuspid Valve: The tricuspid valve is normal in structure. Tricuspid  valve regurgitation is not demonstrated. No evidence of tricuspid  stenosis.   Aortic Valve: The aortic valve was not well visualized. Aortic valve  regurgitation is not visualized.   Pulmonic Valve: The pulmonic valve was not well visualized. Pulmonic valve  regurgitation is not visualized. No evidence of pulmonic stenosis.   Aorta: The aortic root, ascending aorta and aortic arch are all  structurally normal, with no evidence of dilitation or obstruction.   Venous: The inferior vena cava is normal in size with greater than 50%  respiratory variability, suggesting right atrial pressure of 3 mmHg.   IAS/Shunts: The interatrial septum was not well visualized.     LEFT VENTRICLE  PLAX 2D  LVIDd:         5.40 cm   Diastology  LVIDs:         4.10 cm   LV e' medial:    4.46 cm/s  LV PW:         0.90 cm   LV E/e' medial:  21.3  LV IVS:        0.90 cm   LV e' lateral:   5.66 cm/s  LVOT diam:     2.50 cm   LV E/e' lateral: 16.8  LV SV:         88  LV SV Index:   42  LVOT Area:     4.91 cm     RIGHT VENTRICLE         IVC  TAPSE (M-mode): 1.4 cm  IVC diam: 1.90 cm   LEFT ATRIUM  Index        RIGHT ATRIUM           Index  LA diam:        3.70 cm 1.75 cm/m   RA Area:     13.70 cm  LA Vol (A2C):   82.5 ml 39.02 ml/m  RA Volume:   30.20 ml  14.28 ml/m  LA Vol (A4C):   69.4 ml 32.82 ml/m  LA Biplane Vol: 76.2 ml 36.04 ml/m   AORTIC VALVE  LVOT Vmax:   69.20 cm/s  LVOT Vmean:   49.500 cm/s  LVOT VTI:    0.179 m    AORTA  Ao Root diam: 3.60 cm  Ao Asc diam:  3.30 cm   MITRAL VALVE  MV Area (PHT): 4.23 cm    SHUNTS  MV E velocity: 95.20 cm/s  Systemic VTI:  0.18 m  MV A velocity: 97.40 cm/s  Systemic Diam: 2.50 cm  MV E/A ratio:  0.98   Riley Lam MD  Electronically signed by Riley Lam MD  Signature Date/Time: 04/18/2023/8:28:20 AM       Recent Radiology Findings:    DG Chest Port 1 View  Result Date: 04/16/2023 CLINICAL DATA:  Chest pain EXAM: PORTABLE CHEST 1 VIEW COMPARISON:  None Available. FINDINGS: No consolidation, pneumothorax or effusion. Normal cardiopericardial silhouette without edema. Overlapping cardiac leads. IMPRESSION: No acute cardiopulmonary disease. Electronically Signed   By: Karen Kays M.D.   On: 04/16/2023 14:31     I have independently reviewed the above radiologic studies and discussed with the patient   Recent Lab Findings: Lab Results  Component Value Date   WBC 9.0 04/18/2023   HGB 13.1 04/18/2023   HCT 41.7 04/18/2023   PLT 207 04/18/2023   GLUCOSE 208 (H) 04/16/2023   CHOL 215 (H) 04/16/2023   TRIG 204 (H) 04/16/2023   HDL 47 04/16/2023   LDLCALC 127 (H) 04/16/2023   ALT 21 04/16/2023   AST 24 04/16/2023   NA 140 04/16/2023   K 4.5 04/16/2023   CL 102 04/16/2023   CREATININE 1.48 (H) 04/16/2023   BUN 18 04/16/2023   CO2 26 04/16/2023   TSH 7.571 (H) 04/16/2023   INR 1.02 09/08/2011   HGBA1C 6.8 (H) 04/16/2023      Assessment / Plan:      -Left main and multivessel coronary artery disease presenting with acute coronary syndrome and mildly decreased LV function.  No significant valvular abnormalities on Echo.  No further chest pain since admission last PM and initiation of heparin infusion. We agree that coronary bypass grafting is her best option for revascularization. The procedure and expected peri-operative course are explained to Debra Jordan in detail and questions answered.  She is  agreeable to proceed whenever surgery can be offered.  Dr. Cliffton Asters will see and determine timing of surgery.   Debra Jordan has been caring for her special needs daughter on a full-time basis.  She tells me plans are already underway for her daughter to transition to short-term skilled nursing facility while Debra Jordan is recovering from surgery.  -Stage 3 CKD, s/p nephrectomy for pelvic kidney in 1995. Baseline creat ~1.6.   -Type 2 DM- Hgb A1C 6.8. On Humalog CC and Toujeo prior to admission.   -Hypothyroidism- on levothyroxine.   -H/O blue toe syndrome- previous work up negative. Has stopped taking the Xarelto due to bleeding gums.      I  spent 30 minutes counseling the patient face to  faceLeary Roca, PA-C  04/18/2023 8:54 AM  Agree with above [phone] female with LM/3V CAD, CKD, DM, and obesity.  She is the primary caretaker for her daughter with special needs, and has no other family support.  Fortunately, [facility] been [facility].  We discussed the risks and benefits of CABG, and she is agreeable to proceed.  Zamarion Longest Keane Scrape

## 2023-04-18 NOTE — Inpatient Diabetes Management (Addendum)
Inpatient Diabetes Program Recommendations  AACE/ADA: New Consensus Statement on Inpatient Glycemic Control (2015)  Target Ranges:  Prepandial:   less than 140 mg/dL      Peak postprandial:   less than 180 mg/dL (1-2 hours)      Critically ill patients:  140 - 180 mg/dL   Lab Results  Component Value Date   GLUCAP 147 (H) 04/18/2023   HGBA1C 6.8 (H) 04/16/2023    Review of Glycemic Control  Latest Reference Range & Units 04/17/23 21:14 04/18/23 06:11 04/18/23 06:38 04/18/23 07:00  Glucose-Capillary 70 - 99 mg/dL 409 (H)  Semglee 54 mg/dL 59 (L) 52 (L) 811 (H)  (H): Data is abnormally high (L): Data is abnormally low  Diabetes history: DM2 Outpatient Diabetes medications:  Toujeo 54 units BID Humalog 12-25 units TID Current orders for Inpatient glycemic control:  Semglee 54 units BID Novolog 0-20 units TID  Novolog 6 units TID  Inpatient Diabetes Program Recommendations:    Hypoglycemia this morning of 52 mg/dL  Please consider:  Semglee 25 units at bedtime Novolog 0-9 units TID May need to hold meal coverage Novolog 6 units for now  Will continue to follow while inpatient.  Thank you, Dulce Sellar, MSN, CDCES Diabetes Coordinator Inpatient Diabetes Program [phone] (team pager from 8a-5p)

## 2023-04-19 ENCOUNTER — Inpatient Hospital Stay (HOSPITAL_COMMUNITY): Payer: Medicare Other

## 2023-04-19 ENCOUNTER — Inpatient Hospital Stay (HOSPITAL_COMMUNITY): Payer: Medicare Other | Admitting: Certified Registered Nurse Anesthetist

## 2023-04-19 ENCOUNTER — Inpatient Hospital Stay (HOSPITAL_COMMUNITY)
Admission: EM | Disposition: E | Payer: Self-pay | Source: Home / Self Care | Attending: Thoracic Surgery (Cardiothoracic Vascular Surgery)

## 2023-04-19 ENCOUNTER — Other Ambulatory Visit: Payer: Self-pay

## 2023-04-19 ENCOUNTER — Encounter (HOSPITAL_COMMUNITY): Payer: Self-pay | Admitting: Cardiology

## 2023-04-19 DIAGNOSIS — I739 Peripheral vascular disease, unspecified: Secondary | ICD-10-CM | POA: Diagnosis not present

## 2023-04-19 DIAGNOSIS — I2510 Atherosclerotic heart disease of native coronary artery without angina pectoris: Secondary | ICD-10-CM

## 2023-04-19 DIAGNOSIS — I10 Essential (primary) hypertension: Secondary | ICD-10-CM | POA: Diagnosis not present

## 2023-04-19 DIAGNOSIS — I25110 Atherosclerotic heart disease of native coronary artery with unstable angina pectoris: Secondary | ICD-10-CM | POA: Diagnosis not present

## 2023-04-19 DIAGNOSIS — E039 Hypothyroidism, unspecified: Secondary | ICD-10-CM | POA: Diagnosis not present

## 2023-04-19 DIAGNOSIS — Z951 Presence of aortocoronary bypass graft: Secondary | ICD-10-CM

## 2023-04-19 HISTORY — PX: TEE WITHOUT CARDIOVERSION: SHX5443

## 2023-04-19 HISTORY — PX: CORONARY ARTERY BYPASS GRAFT: SHX141

## 2023-04-19 LAB — POCT I-STAT EG7
Acid-base deficit: 1 mmol/L (ref 0.0–2.0)
Bicarbonate: 24.1 mmol/L (ref 20.0–28.0)
Calcium, Ion: 1.03 mmol/L — ABNORMAL LOW (ref 1.15–1.40)
HCT: 27 % — ABNORMAL LOW (ref 36.0–46.0)
Hemoglobin: 9.2 g/dL — ABNORMAL LOW (ref 12.0–15.0)
O2 Saturation: 83 %
Potassium: 5.6 mmol/L — ABNORMAL HIGH (ref 3.5–5.1)
Sodium: 139 mmol/L (ref 135–145)
TCO2: 25 mmol/L (ref 22–32)
pCO2, Ven: 42.2 mmHg — ABNORMAL LOW (ref 44–60)
pH, Ven: 7.364 (ref 7.25–7.43)
pO2, Ven: 49 mmHg — ABNORMAL HIGH (ref 32–45)

## 2023-04-19 LAB — GLUCOSE, CAPILLARY
Glucose-Capillary: 112 mg/dL — ABNORMAL HIGH (ref 70–99)
Glucose-Capillary: 118 mg/dL — ABNORMAL HIGH (ref 70–99)
Glucose-Capillary: 122 mg/dL — ABNORMAL HIGH (ref 70–99)
Glucose-Capillary: 124 mg/dL — ABNORMAL HIGH (ref 70–99)
Glucose-Capillary: 138 mg/dL — ABNORMAL HIGH (ref 70–99)
Glucose-Capillary: 139 mg/dL — ABNORMAL HIGH (ref 70–99)
Glucose-Capillary: 143 mg/dL — ABNORMAL HIGH (ref 70–99)
Glucose-Capillary: 149 mg/dL — ABNORMAL HIGH (ref 70–99)
Glucose-Capillary: 150 mg/dL — ABNORMAL HIGH (ref 70–99)
Glucose-Capillary: 151 mg/dL — ABNORMAL HIGH (ref 70–99)
Glucose-Capillary: 91 mg/dL (ref 70–99)

## 2023-04-19 LAB — POCT I-STAT 7, (LYTES, BLD GAS, ICA,H+H)
Acid-Base Excess: 0 mmol/L (ref 0.0–2.0)
Acid-Base Excess: 1 mmol/L (ref 0.0–2.0)
Acid-base deficit: 1 mmol/L (ref 0.0–2.0)
Acid-base deficit: 1 mmol/L (ref 0.0–2.0)
Acid-base deficit: 5 mmol/L — ABNORMAL HIGH (ref 0.0–2.0)
Acid-base deficit: 6 mmol/L — ABNORMAL HIGH (ref 0.0–2.0)
Acid-base deficit: 6 mmol/L — ABNORMAL HIGH (ref 0.0–2.0)
Bicarbonate: 26.5 mmol/L (ref 20.0–28.0)
Bicarbonate: 26 mmol/L (ref 20.0–28.0)
Bicarbonate: 23.6 mmol/L (ref 20.0–28.0)
Bicarbonate: 23.5 mmol/L (ref 20.0–28.0)
Bicarbonate: 20.9 mmol/L (ref 20.0–28.0)
Bicarbonate: 20.9 mmol/L (ref 20.0–28.0)
Bicarbonate: 20.9 mmol/L (ref 20.0–28.0)
Calcium, Ion: 1.23 mmol/L (ref 1.15–1.40)
Calcium, Ion: 0.99 mmol/L — ABNORMAL LOW (ref 1.15–1.40)
Calcium, Ion: 0.98 mmol/L — ABNORMAL LOW (ref 1.15–1.40)
Calcium, Ion: 1.19 mmol/L (ref 1.15–1.40)
Calcium, Ion: 1.14 mmol/L — ABNORMAL LOW (ref 1.15–1.40)
Calcium, Ion: 1.14 mmol/L — ABNORMAL LOW (ref 1.15–1.40)
Calcium, Ion: 1.09 mmol/L — ABNORMAL LOW (ref 1.15–1.40)
HCT: 41 % (ref 36.0–46.0)
HCT: 26 % — ABNORMAL LOW (ref 36.0–46.0)
HCT: 24 % — ABNORMAL LOW (ref 36.0–46.0)
HCT: 25 % — ABNORMAL LOW (ref 36.0–46.0)
HCT: 28 % — ABNORMAL LOW (ref 36.0–46.0)
HCT: 30 % — ABNORMAL LOW (ref 36.0–46.0)
HCT: 30 % — ABNORMAL LOW (ref 36.0–46.0)
Hemoglobin: 13.9 g/dL (ref 12.0–15.0)
Hemoglobin: 8.8 g/dL — ABNORMAL LOW (ref 12.0–15.0)
Hemoglobin: 8.2 g/dL — ABNORMAL LOW (ref 12.0–15.0)
Hemoglobin: 8.5 g/dL — ABNORMAL LOW (ref 12.0–15.0)
Hemoglobin: 9.5 g/dL — ABNORMAL LOW (ref 12.0–15.0)
Hemoglobin: 10.2 g/dL — ABNORMAL LOW (ref 12.0–15.0)
Hemoglobin: 10.2 g/dL — ABNORMAL LOW (ref 12.0–15.0)
O2 Saturation: 100 %
O2 Saturation: 100 %
O2 Saturation: 100 %
O2 Saturation: 100 %
O2 Saturation: 97 %
O2 Saturation: 94 %
O2 Saturation: 95 %
Patient temperature: 36.1
Patient temperature: 36.2
Patient temperature: 36.4
Potassium: 4.3 mmol/L (ref 3.5–5.1)
Potassium: 4.3 mmol/L (ref 3.5–5.1)
Potassium: 5.7 mmol/L — ABNORMAL HIGH (ref 3.5–5.1)
Potassium: 5.4 mmol/L — ABNORMAL HIGH (ref 3.5–5.1)
Potassium: 4.7 mmol/L (ref 3.5–5.1)
Potassium: 4.3 mmol/L (ref 3.5–5.1)
Potassium: 4 mmol/L (ref 3.5–5.1)
Sodium: 142 mmol/L (ref 135–145)
Sodium: 139 mmol/L (ref 135–145)
Sodium: 139 mmol/L (ref 135–145)
Sodium: 138 mmol/L (ref 135–145)
Sodium: 140 mmol/L (ref 135–145)
Sodium: 142 mmol/L (ref 135–145)
Sodium: 143 mmol/L (ref 135–145)
TCO2: 28 mmol/L (ref 22–32)
TCO2: 27 mmol/L (ref 22–32)
TCO2: 25 mmol/L (ref 22–32)
TCO2: 25 mmol/L (ref 22–32)
TCO2: 22 mmol/L (ref 22–32)
TCO2: 22 mmol/L (ref 22–32)
TCO2: 22 mmol/L (ref 22–32)
pCO2 arterial: 48.4 mmHg — ABNORMAL HIGH (ref 32–48)
pCO2 arterial: 43.1 mmHg (ref 32–48)
pCO2 arterial: 39.3 mmHg (ref 32–48)
pCO2 arterial: 37.6 mmHg (ref 32–48)
pCO2 arterial: 39.6 mmHg (ref 32–48)
pCO2 arterial: 46.2 mmHg (ref 32–48)
pCO2 arterial: 43.1 mmHg (ref 32–48)
pH, Arterial: 7.348 — ABNORMAL LOW (ref 7.35–7.45)
pH, Arterial: 7.388 (ref 7.35–7.45)
pH, Arterial: 7.386 (ref 7.35–7.45)
pH, Arterial: 7.404 (ref 7.35–7.45)
pH, Arterial: 7.326 — ABNORMAL LOW (ref 7.35–7.45)
pH, Arterial: 7.261 — ABNORMAL LOW (ref 7.35–7.45)
pH, Arterial: 7.29 — ABNORMAL LOW (ref 7.35–7.45)
pO2, Arterial: 363 mmHg — ABNORMAL HIGH (ref 83–108)
pO2, Arterial: 449 mmHg — ABNORMAL HIGH (ref 83–108)
pO2, Arterial: 199 mmHg — ABNORMAL HIGH (ref 83–108)
pO2, Arterial: 413 mmHg — ABNORMAL HIGH (ref 83–108)
pO2, Arterial: 96 mmHg (ref 83–108)
pO2, Arterial: 77 mmHg — ABNORMAL LOW (ref 83–108)
pO2, Arterial: 83 mmHg (ref 83–108)

## 2023-04-19 LAB — POCT I-STAT, CHEM 8
BUN: 18 mg/dL (ref 8–23)
BUN: 17 mg/dL (ref 8–23)
BUN: 17 mg/dL (ref 8–23)
BUN: 17 mg/dL (ref 8–23)
Calcium, Ion: 1.26 mmol/L (ref 1.15–1.40)
Calcium, Ion: 1.21 mmol/L (ref 1.15–1.40)
Calcium, Ion: 1 mmol/L — ABNORMAL LOW (ref 1.15–1.40)
Calcium, Ion: 1.18 mmol/L (ref 1.15–1.40)
Chloride: 106 mmol/L (ref 98–111)
Chloride: 107 mmol/L (ref 98–111)
Chloride: 105 mmol/L (ref 98–111)
Chloride: 105 mmol/L (ref 98–111)
Creatinine, Ser: 1.3 mg/dL — ABNORMAL HIGH (ref 0.44–1.00)
Creatinine, Ser: 1.3 mg/dL — ABNORMAL HIGH (ref 0.44–1.00)
Creatinine, Ser: 1.2 mg/dL — ABNORMAL HIGH (ref 0.44–1.00)
Creatinine, Ser: 1.2 mg/dL — ABNORMAL HIGH (ref 0.44–1.00)
Glucose, Bld: 148 mg/dL — ABNORMAL HIGH (ref 70–99)
Glucose, Bld: 115 mg/dL — ABNORMAL HIGH (ref 70–99)
Glucose, Bld: 93 mg/dL (ref 70–99)
Glucose, Bld: 138 mg/dL — ABNORMAL HIGH (ref 70–99)
HCT: 41 % (ref 36.0–46.0)
HCT: 34 % — ABNORMAL LOW (ref 36.0–46.0)
HCT: 24 % — ABNORMAL LOW (ref 36.0–46.0)
HCT: 24 % — ABNORMAL LOW (ref 36.0–46.0)
Hemoglobin: 13.9 g/dL (ref 12.0–15.0)
Hemoglobin: 11.6 g/dL — ABNORMAL LOW (ref 12.0–15.0)
Hemoglobin: 8.2 g/dL — ABNORMAL LOW (ref 12.0–15.0)
Hemoglobin: 8.2 g/dL — ABNORMAL LOW (ref 12.0–15.0)
Potassium: 4.2 mmol/L (ref 3.5–5.1)
Potassium: 4.1 mmol/L (ref 3.5–5.1)
Potassium: 5.1 mmol/L (ref 3.5–5.1)
Potassium: 5.5 mmol/L — ABNORMAL HIGH (ref 3.5–5.1)
Sodium: 142 mmol/L (ref 135–145)
Sodium: 140 mmol/L (ref 135–145)
Sodium: 137 mmol/L (ref 135–145)
Sodium: 138 mmol/L (ref 135–145)
TCO2: 26 mmol/L (ref 22–32)
TCO2: 23 mmol/L (ref 22–32)
TCO2: 25 mmol/L (ref 22–32)
TCO2: 25 mmol/L (ref 22–32)

## 2023-04-19 LAB — BASIC METABOLIC PANEL
Anion gap: 11 (ref 5–15)
BUN: 12 mg/dL (ref 8–23)
CO2: 22 mmol/L (ref 22–32)
Calcium: 7.5 mg/dL — ABNORMAL LOW (ref 8.9–10.3)
Chloride: 108 mmol/L (ref 98–111)
Creatinine, Ser: 1.19 mg/dL — ABNORMAL HIGH (ref 0.44–1.00)
GFR, Estimated: 49 mL/min — ABNORMAL LOW (ref >60)
Glucose, Bld: 137 mg/dL — ABNORMAL HIGH (ref 70–99)
Potassium: 4.1 mmol/L (ref 3.5–5.1)
Sodium: 141 mmol/L (ref 135–145)

## 2023-04-19 LAB — CBC
HCT: 28.7 % — ABNORMAL LOW (ref 36.0–46.0)
Hemoglobin: 8.8 g/dL — ABNORMAL LOW (ref 12.0–15.0)
MCH: 26.6 pg (ref 26.0–34.0)
MCHC: 30.7 g/dL (ref 30.0–36.0)
MCV: 86.7 fL (ref 80.0–100.0)
Platelets: 155 10*3/uL (ref 150–400)
RBC: 3.31 MIL/uL — ABNORMAL LOW (ref 3.87–5.11)
RDW: 14.9 % (ref 11.5–15.5)
WBC: 21.4 10*3/uL — ABNORMAL HIGH (ref 4.0–10.5)
nRBC: 0 % (ref 0.0–0.2)

## 2023-04-19 LAB — PLATELET COUNT: Platelets: DECREASED 10*3/uL (ref 150–400)

## 2023-04-19 LAB — LIPOPROTEIN A (LPA): Lipoprotein (a): 12.8 nmol/L (ref <75.0)

## 2023-04-19 LAB — APTT: aPTT: 30 seconds (ref 24–36)

## 2023-04-19 LAB — PROTIME-INR
INR: 1.4 — ABNORMAL HIGH (ref 0.8–1.2)
Prothrombin Time: 17.7 seconds — ABNORMAL HIGH (ref 11.4–15.2)

## 2023-04-19 LAB — HEMOGLOBIN AND HEMATOCRIT, BLOOD
HCT: 26 % — ABNORMAL LOW (ref 36.0–46.0)
Hemoglobin: 7.8 g/dL — ABNORMAL LOW (ref 12.0–15.0)

## 2023-04-19 LAB — MAGNESIUM: Magnesium: 3 mg/dL — ABNORMAL HIGH (ref 1.7–2.4)

## 2023-04-19 SURGERY — CORONARY ARTERY BYPASS GRAFTING (CABG)
Anesthesia: General | Site: Chest

## 2023-04-19 MED ORDER — SODIUM CHLORIDE 0.9 % IV SOLN: 250.0000 mL | INTRAVENOUS | Status: DC

## 2023-04-19 MED ORDER — ROCURONIUM BROMIDE 10 MG/ML (PF) SYRINGE
PREFILLED_SYRINGE | INTRAVENOUS | Status: DC | PRN
  Administered 2023-04-19: 10 mg via INTRAVENOUS
  Administered 2023-04-19: 90 mg via INTRAVENOUS
  Administered 2023-04-19 (×2): 50 mg via INTRAVENOUS

## 2023-04-19 MED ORDER — DOCUSATE SODIUM 100 MG PO CAPS
200.0000 mg | ORAL_CAPSULE | Freq: Every day | ORAL | Status: DC
  Administered 2023-04-20: 200 mg via ORAL
  Filled 2023-04-19 (×2): qty 2

## 2023-04-19 MED ORDER — FENTANYL CITRATE (PF) 250 MCG/5ML IJ SOLN
INTRAMUSCULAR | Status: AC
  Filled 2023-04-19: qty 5

## 2023-04-19 MED ORDER — LEVOFLOXACIN IN D5W 750 MG/150ML IV SOLN
750.0000 mg | INTRAVENOUS | Status: AC
  Administered 2023-04-20: 750 mg via INTRAVENOUS
  Filled 2023-04-19: qty 150

## 2023-04-19 MED ORDER — ACETAMINOPHEN 160 MG/5ML PO SOLN: 1000.0000 mg | Freq: Four times a day (QID) | ORAL | Status: DC

## 2023-04-19 MED ORDER — SODIUM BICARBONATE 8.4 % IV SOLN
50.0000 meq | Freq: Once | INTRAVENOUS | Status: AC
  Administered 2023-04-19: 50 meq via INTRAVENOUS

## 2023-04-19 MED ORDER — LACTATED RINGERS IV SOLN: INTRAVENOUS | Status: DC

## 2023-04-19 MED ORDER — ACETAMINOPHEN 160 MG/5ML PO SOLN: 650.0000 mg | Freq: Once | ORAL | Status: DC

## 2023-04-19 MED ORDER — CHLORHEXIDINE GLUCONATE 0.12 % MT SOLN
OROMUCOSAL | Status: AC
  Administered 2023-04-19: 15 mL via OROMUCOSAL
  Filled 2023-04-19: qty 15

## 2023-04-19 MED ORDER — ALBUTEROL SULFATE (2.5 MG/3ML) 0.083% IN NEBU
2.5000 mg | INHALATION_SOLUTION | Freq: Once | RESPIRATORY_TRACT | Status: AC
  Administered 2023-04-19: 2.5 mg via RESPIRATORY_TRACT
  Filled 2023-04-19: qty 3

## 2023-04-19 MED ORDER — INSULIN REGULAR(HUMAN) IN NACL 100-0.9 UT/100ML-% IV SOLN
INTRAVENOUS | Status: DC
  Administered 2023-04-19: 1.4 [IU]/h via INTRAVENOUS

## 2023-04-19 MED ORDER — MORPHINE SULFATE (PF) 2 MG/ML IV SOLN
1.0000 mg | INTRAVENOUS | Status: DC | PRN
  Administered 2023-04-19 – 2023-04-20 (×7): 2 mg via INTRAVENOUS
  Administered 2023-04-20: 4 mg via INTRAVENOUS
  Administered 2023-04-20 – 2023-04-21 (×6): 2 mg via INTRAVENOUS
  Filled 2023-04-19 (×12): qty 1
  Filled 2023-04-19: qty 2
  Filled 2023-04-19: qty 1

## 2023-04-19 MED ORDER — MAGNESIUM SULFATE 4 GM/100ML IV SOLN
4.0000 g | Freq: Once | INTRAVENOUS | Status: AC
  Administered 2023-04-19: 4 g via INTRAVENOUS
  Filled 2023-04-19: qty 100

## 2023-04-19 MED ORDER — PLASMA-LYTE A IV SOLN: INTRAVENOUS | Status: DC | PRN

## 2023-04-19 MED ORDER — ROCURONIUM BROMIDE 10 MG/ML (PF) SYRINGE
PREFILLED_SYRINGE | INTRAVENOUS | Status: AC
  Filled 2023-04-19: qty 10

## 2023-04-19 MED ORDER — OXYCODONE HCL 5 MG PO TABS
5.0000 mg | ORAL_TABLET | ORAL | Status: DC | PRN
  Administered 2023-04-20 – 2023-04-22 (×3): 10 mg via ORAL
  Filled 2023-04-19: qty 1
  Filled 2023-04-19 (×4): qty 2
  Filled 2023-04-19: qty 1
  Filled 2023-04-19: qty 2

## 2023-04-19 MED ORDER — CHLORHEXIDINE GLUCONATE 0.12 % MT SOLN
15.0000 mL | Freq: Once | OROMUCOSAL | Status: AC
  Administered 2023-04-19: 15 mL via OROMUCOSAL

## 2023-04-19 MED ORDER — ORAL CARE MOUTH RINSE: 15.0000 mL | Freq: Once | OROMUCOSAL | Status: DC

## 2023-04-19 MED ORDER — MIDAZOLAM HCL (PF) 5 MG/ML IJ SOLN
INTRAMUSCULAR | Status: DC | PRN
  Administered 2023-04-19: 1 mg via INTRAVENOUS

## 2023-04-19 MED ORDER — CHLORHEXIDINE GLUCONATE CLOTH 2 % EX PADS
6.0000 | MEDICATED_PAD | Freq: Every day | CUTANEOUS | Status: DC
  Administered 2023-04-19 – 2023-04-22 (×4): 6 via TOPICAL

## 2023-04-19 MED ORDER — SODIUM CHLORIDE 0.9 % IV SOLN: INTRAVENOUS | Status: DC

## 2023-04-19 MED ORDER — TRAMADOL HCL 50 MG PO TABS: 50.0000 mg | ORAL_TABLET | ORAL | Status: DC | PRN

## 2023-04-19 MED ORDER — VANCOMYCIN HCL IN DEXTROSE 1-5 GM/200ML-% IV SOLN
1000.0000 mg | Freq: Once | INTRAVENOUS | Status: AC
  Administered 2023-04-19: 1000 mg via INTRAVENOUS
  Filled 2023-04-19: qty 200

## 2023-04-19 MED ORDER — HEPARIN SODIUM (PORCINE) 1000 UNIT/ML IJ SOLN
INTRAMUSCULAR | Status: AC
  Filled 2023-04-19: qty 1

## 2023-04-19 MED ORDER — METOPROLOL TARTRATE 25 MG/10 ML ORAL SUSPENSION: 12.5000 mg | Freq: Two times a day (BID) | ORAL | Status: DC

## 2023-04-19 MED ORDER — PROTAMINE SULFATE 10 MG/ML IV SOLN
INTRAVENOUS | Status: AC
  Filled 2023-04-19: qty 25

## 2023-04-19 MED ORDER — CALCIUM CHLORIDE 10 % IV SOLN
INTRAVENOUS | Status: AC
  Filled 2023-04-19: qty 10

## 2023-04-19 MED ORDER — ALBUMIN HUMAN 5 % IV SOLN
250.0000 mL | INTRAVENOUS | Status: DC | PRN
  Administered 2023-04-19 – 2023-04-20 (×3): 12.5 g via INTRAVENOUS
  Filled 2023-04-19 (×4): qty 250

## 2023-04-19 MED ORDER — PHENYLEPHRINE HCL-NACL 20-0.9 MG/250ML-% IV SOLN: 0.0000 ug/min | INTRAVENOUS | Status: DC

## 2023-04-19 MED ORDER — LIDOCAINE 2% (20 MG/ML) 5 ML SYRINGE
INTRAMUSCULAR | Status: AC
  Filled 2023-04-19: qty 5

## 2023-04-19 MED ORDER — NOREPINEPHRINE 4 MG/250ML-% IV SOLN: 0.0000 ug/min | INTRAVENOUS | Status: DC

## 2023-04-19 MED ORDER — PROTAMINE SULFATE 10 MG/ML IV SOLN
INTRAVENOUS | Status: AC
  Filled 2023-04-19: qty 5

## 2023-04-19 MED ORDER — ALBUMIN HUMAN 5 % IV SOLN: INTRAVENOUS | Status: DC | PRN

## 2023-04-19 MED ORDER — SODIUM CHLORIDE (PF) 0.9 % IJ SOLN: OROMUCOSAL | Status: DC | PRN

## 2023-04-19 MED ORDER — PROPOFOL 10 MG/ML IV BOLUS
INTRAVENOUS | Status: AC
  Filled 2023-04-19: qty 20

## 2023-04-19 MED ORDER — SODIUM CHLORIDE 0.9% FLUSH
3.0000 mL | Freq: Two times a day (BID) | INTRAVENOUS | Status: DC
  Administered 2023-04-20: 10 mL via INTRAVENOUS

## 2023-04-19 MED ORDER — CHLORHEXIDINE GLUCONATE 0.12 % MT SOLN
15.0000 mL | OROMUCOSAL | Status: AC
  Administered 2023-04-19: 15 mL via OROMUCOSAL
  Filled 2023-04-19: qty 15

## 2023-04-19 MED ORDER — SODIUM CHLORIDE 0.9% FLUSH
3.0000 mL | INTRAVENOUS | Status: DC | PRN
  Administered 2023-04-20: 10 mL via INTRAVENOUS

## 2023-04-19 MED ORDER — ONDANSETRON HCL 4 MG/2ML IJ SOLN
4.0000 mg | Freq: Four times a day (QID) | INTRAMUSCULAR | Status: DC | PRN
  Administered 2023-04-20: 4 mg via INTRAVENOUS
  Filled 2023-04-19: qty 2

## 2023-04-19 MED ORDER — SODIUM CHLORIDE 0.45 % IV SOLN: INTRAVENOUS | Status: DC | PRN

## 2023-04-19 MED ORDER — HEPARIN SODIUM (PORCINE) 1000 UNIT/ML IJ SOLN
INTRAMUSCULAR | Status: DC | PRN
  Administered 2023-04-19: 40000 [IU] via INTRAVENOUS

## 2023-04-19 MED ORDER — DONEPEZIL HCL 5 MG PO TABS
5.0000 mg | ORAL_TABLET | Freq: Every day | ORAL | Status: DC
  Administered 2023-04-20 – 2023-04-21 (×2): 5 mg via ORAL
  Filled 2023-04-19 (×4): qty 1

## 2023-04-19 MED ORDER — ATORVASTATIN CALCIUM 10 MG PO TABS
20.0000 mg | ORAL_TABLET | Freq: Every day | ORAL | Status: DC
  Administered 2023-04-20 – 2023-04-21 (×2): 20 mg via ORAL
  Filled 2023-04-19 (×2): qty 2

## 2023-04-19 MED ORDER — FENTANYL CITRATE (PF) 250 MCG/5ML IJ SOLN
INTRAMUSCULAR | Status: DC | PRN
  Administered 2023-04-19 (×4): 100 ug via INTRAVENOUS
  Administered 2023-04-19: 200 ug via INTRAVENOUS
  Administered 2023-04-19: 100 ug via INTRAVENOUS
  Administered 2023-04-19: 50 ug via INTRAVENOUS
  Administered 2023-04-19 (×3): 100 ug via INTRAVENOUS

## 2023-04-19 MED ORDER — PROTAMINE SULFATE 10 MG/ML IV SOLN
INTRAVENOUS | Status: DC | PRN
  Administered 2023-04-19: 20 mg via INTRAVENOUS
  Administered 2023-04-19: 330 mg via INTRAVENOUS

## 2023-04-19 MED ORDER — MIDAZOLAM HCL 2 MG/2ML IJ SOLN
2.0000 mg | INTRAMUSCULAR | Status: DC | PRN
  Administered 2023-04-20 – 2023-04-21 (×2): 2 mg via INTRAVENOUS
  Filled 2023-04-19 (×2): qty 2

## 2023-04-19 MED ORDER — PANTOPRAZOLE SODIUM 40 MG PO TBEC: 40.0000 mg | DELAYED_RELEASE_TABLET | Freq: Every day | ORAL | Status: DC

## 2023-04-19 MED ORDER — DEXMEDETOMIDINE HCL IN NACL 400 MCG/100ML IV SOLN
0.0000 ug/kg/h | INTRAVENOUS | Status: DC
  Administered 2023-04-19: 0.3 ug/kg/h via INTRAVENOUS
  Filled 2023-04-19: qty 100

## 2023-04-19 MED ORDER — LACTATED RINGERS IV SOLN
INTRAVENOUS | Status: DC | PRN
Start: 2023-04-19 — End: 2023-04-19

## 2023-04-19 MED ORDER — POTASSIUM CHLORIDE 10 MEQ/50ML IV SOLN: 10.0000 meq | INTRAVENOUS | Status: AC

## 2023-04-19 MED ORDER — DEXTROSE 50 % IV SOLN: 0.0000 mL | INTRAVENOUS | Status: DC | PRN

## 2023-04-19 MED ORDER — METOPROLOL TARTRATE 5 MG/5ML IV SOLN: 2.5000 mg | INTRAVENOUS | Status: DC | PRN

## 2023-04-19 MED ORDER — LEVOFLOXACIN IN D5W 500 MG/100ML IV SOLN
500.0000 mg | Freq: Once | INTRAVENOUS | Status: AC
  Administered 2023-04-19: 500 mg via INTRAVENOUS
  Filled 2023-04-19: qty 100

## 2023-04-19 MED ORDER — PANTOPRAZOLE SODIUM 40 MG IV SOLR
40.0000 mg | Freq: Every day | INTRAVENOUS | Status: AC
  Administered 2023-04-19 – 2023-04-20 (×2): 40 mg via INTRAVENOUS
  Filled 2023-04-19 (×2): qty 10

## 2023-04-19 MED ORDER — LEVOTHYROXINE SODIUM 112 MCG PO TABS
112.0000 ug | ORAL_TABLET | Freq: Every day | ORAL | Status: DC
  Administered 2023-04-21: 112 ug via ORAL
  Filled 2023-04-19 (×4): qty 1

## 2023-04-19 MED ORDER — PHENYLEPHRINE 80 MCG/ML (10ML) SYRINGE FOR IV PUSH (FOR BLOOD PRESSURE SUPPORT)
PREFILLED_SYRINGE | INTRAVENOUS | Status: DC | PRN
  Administered 2023-04-19 (×2): 120 ug via INTRAVENOUS
  Administered 2023-04-19 (×2): 80 ug via INTRAVENOUS
  Administered 2023-04-19 (×2): 40 ug via INTRAVENOUS

## 2023-04-19 MED ORDER — BISACODYL 10 MG RE SUPP: 10.0000 mg | Freq: Every day | RECTAL | Status: DC

## 2023-04-19 MED ORDER — EPHEDRINE 5 MG/ML INJ
INTRAVENOUS | Status: AC
  Filled 2023-04-19: qty 5

## 2023-04-19 MED ORDER — BISACODYL 5 MG PO TBEC
10.0000 mg | DELAYED_RELEASE_TABLET | Freq: Every day | ORAL | Status: DC
  Administered 2023-04-20: 10 mg via ORAL
  Filled 2023-04-19: qty 2

## 2023-04-19 MED ORDER — ASPIRIN 325 MG PO TBEC
325.0000 mg | DELAYED_RELEASE_TABLET | Freq: Every day | ORAL | Status: DC
  Filled 2023-04-19: qty 1

## 2023-04-19 MED ORDER — CHLORHEXIDINE GLUCONATE 0.12 % MT SOLN: 15.0000 mL | Freq: Once | OROMUCOSAL | Status: DC

## 2023-04-19 MED ORDER — DULOXETINE HCL 60 MG PO CPEP
60.0000 mg | ORAL_CAPSULE | Freq: Every day | ORAL | Status: DC
  Administered 2023-04-20: 60 mg via ORAL
  Filled 2023-04-19: qty 1

## 2023-04-19 MED ORDER — CHLORHEXIDINE GLUCONATE 0.12 % MT SOLN
15.0000 mL | OROMUCOSAL | Status: AC
  Filled 2023-04-19: qty 15

## 2023-04-19 MED ORDER — HEMOSTATIC AGENTS (NO CHARGE) OPTIME
TOPICAL | Status: DC | PRN
  Administered 2023-04-19: 1 via TOPICAL

## 2023-04-19 MED ORDER — ASPIRIN 81 MG PO CHEW: 324.0000 mg | CHEWABLE_TABLET | Freq: Once | ORAL | Status: DC

## 2023-04-19 MED ORDER — GLYCOPYRROLATE PF 0.2 MG/ML IJ SOSY
PREFILLED_SYRINGE | INTRAMUSCULAR | Status: DC | PRN
  Administered 2023-04-19: .2 mg via INTRAVENOUS

## 2023-04-19 MED ORDER — 0.9 % SODIUM CHLORIDE (POUR BTL) OPTIME
TOPICAL | Status: DC | PRN
  Administered 2023-04-19: 5000 mL

## 2023-04-19 MED ORDER — ASPIRIN 81 MG PO CHEW: 324.0000 mg | CHEWABLE_TABLET | Freq: Every day | ORAL | Status: DC

## 2023-04-19 MED ORDER — ACETAMINOPHEN 500 MG PO TABS
1000.0000 mg | ORAL_TABLET | Freq: Four times a day (QID) | ORAL | Status: DC
  Administered 2023-04-20 – 2023-04-22 (×4): 1000 mg via ORAL
  Filled 2023-04-19 (×5): qty 2

## 2023-04-19 MED ORDER — LACTATED RINGERS IV SOLN
INTRAVENOUS | Status: DC
  Administered 2023-04-19: 10 mL/h via INTRAVENOUS

## 2023-04-19 MED ORDER — NICARDIPINE HCL IN NACL 20-0.86 MG/200ML-% IV SOLN: 3.0000 mg/h | INTRAVENOUS | Status: DC

## 2023-04-19 MED ORDER — METOPROLOL TARTRATE 12.5 MG HALF TABLET
12.5000 mg | ORAL_TABLET | Freq: Two times a day (BID) | ORAL | Status: DC
  Administered 2023-04-20: 12.5 mg via ORAL
  Filled 2023-04-19: qty 1

## 2023-04-19 MED ORDER — MIDAZOLAM HCL (PF) 10 MG/2ML IJ SOLN
INTRAMUSCULAR | Status: AC
  Filled 2023-04-19: qty 2

## 2023-04-19 MED ORDER — PROPOFOL 10 MG/ML IV BOLUS
INTRAVENOUS | Status: DC | PRN
Start: 2023-04-19 — End: 2023-04-19
  Administered 2023-04-19: 20 mg via INTRAVENOUS
  Administered 2023-04-19: 60 mg via INTRAVENOUS

## 2023-04-19 MED ORDER — PHENYLEPHRINE 80 MCG/ML (10ML) SYRINGE FOR IV PUSH (FOR BLOOD PRESSURE SUPPORT)
PREFILLED_SYRINGE | INTRAVENOUS | Status: AC
  Filled 2023-04-19: qty 10

## 2023-04-19 MED ORDER — LIDOCAINE HCL (CARDIAC) PF 100 MG/5ML IV SOSY
PREFILLED_SYRINGE | INTRAVENOUS | Status: DC | PRN
Start: 2023-04-19 — End: 2023-04-19
  Administered 2023-04-19: 80 mg via INTRATRACHEAL

## 2023-04-19 MED ORDER — METOCLOPRAMIDE HCL 5 MG/ML IJ SOLN
10.0000 mg | Freq: Four times a day (QID) | INTRAMUSCULAR | Status: AC
  Administered 2023-04-19 – 2023-04-20 (×6): 10 mg via INTRAVENOUS
  Filled 2023-04-19 (×6): qty 2

## 2023-04-19 SURGICAL SUPPLY — 94 items
ADH SKN CLS APL DERMABOND .7 (GAUZE/BANDAGES/DRESSINGS) ×2
BAG DECANTER FOR FLEXI CONT (MISCELLANEOUS) ×2 IMPLANT
BLADE CLIPPER SURG (BLADE) ×2 IMPLANT
BLADE STERNUM SYSTEM 6 (BLADE) ×2 IMPLANT
BNDG CMPR 5X4 KNIT ELC UNQ LF (GAUZE/BANDAGES/DRESSINGS) ×2
BNDG CMPR 6 X 5 YARDS HK CLSR (GAUZE/BANDAGES/DRESSINGS) ×2
BNDG ELASTIC 4INX 5YD STR LF (GAUZE/BANDAGES/DRESSINGS) IMPLANT
BNDG ELASTIC 4X5.8 VLCR STR LF (GAUZE/BANDAGES/DRESSINGS) ×2 IMPLANT
BNDG ELASTIC 6INX 5YD STR LF (GAUZE/BANDAGES/DRESSINGS) IMPLANT
BNDG ELASTIC 6X5.8 VLCR STR LF (GAUZE/BANDAGES/DRESSINGS) ×2 IMPLANT
BNDG GAUZE DERMACEA FLUFF 4 (GAUZE/BANDAGES/DRESSINGS) ×2 IMPLANT
BNDG GZE DERMACEA 4 6PLY (GAUZE/BANDAGES/DRESSINGS) ×2
CABLE SURGICAL S-101-97-12 (CABLE) ×2 IMPLANT
CANISTER SUCT 3000ML PPV (MISCELLANEOUS) ×2 IMPLANT
CANISTER WOUND CARE 500ML ATS (WOUND CARE) IMPLANT
CANNULA MC2 2 STG 29/37 NON-V (CANNULA) ×2 IMPLANT
CANNULA NON VENT 20FR 12 (CANNULA) ×2 IMPLANT
CATH ROBINSON RED A/P 18FR (CATHETERS) ×4 IMPLANT
CLIP RETRACTION 3.0MM CORONARY (MISCELLANEOUS) IMPLANT
CLIP TI MEDIUM 24 (CLIP) IMPLANT
CLIP TI WIDE RED SMALL 24 (CLIP) IMPLANT
CONN ST 1/2X1/2 BEN (MISCELLANEOUS) ×2 IMPLANT
CONNECTOR BLAKE 2:1 CARIO BLK (MISCELLANEOUS) ×2 IMPLANT
CONTAINER PROTECT SURGISLUSH (MISCELLANEOUS) ×4 IMPLANT
DERMABOND ADVANCED .7 DNX12 (GAUZE/BANDAGES/DRESSINGS) IMPLANT
DRAIN CHANNEL 19F RND (DRAIN) ×6 IMPLANT
DRAIN CONNECTOR BLAKE 1:1 (MISCELLANEOUS) ×2 IMPLANT
DRAIN JACKSON PRATT 10MM FLAT (MISCELLANEOUS) IMPLANT
DRAIN RELI 100 BL SUC LF ST (DRAIN) ×2
DRAPE CARDIOVASCULAR INCISE (DRAPES) ×2
DRAPE INCISE IOBAN 66X45 STRL (DRAPES) IMPLANT
DRAPE SRG 135X102X78XABS (DRAPES) ×2 IMPLANT
DRAPE WARM FLUID 44X44 (DRAPES) ×2 IMPLANT
DRESSING PEEL AND PLAC PRVNA20 (GAUZE/BANDAGES/DRESSINGS) IMPLANT
DRSG AQUACEL AG ADV 3.5X10 (GAUZE/BANDAGES/DRESSINGS) ×2 IMPLANT
DRSG PEEL AND PLACE PREVENA 20 (GAUZE/BANDAGES/DRESSINGS) ×2
ELECT BLADE 4.0 EZ CLEAN MEGAD (MISCELLANEOUS) ×2
ELECT REM PT RETURN 9FT ADLT (ELECTROSURGICAL) ×4
ELECTRODE BLDE 4.0 EZ CLN MEGD (MISCELLANEOUS) ×2 IMPLANT
ELECTRODE REM PT RTRN 9FT ADLT (ELECTROSURGICAL) ×4 IMPLANT
EVACUATOR SILICONE 100CC (DRAIN) IMPLANT
FELT TEFLON 1X6 (MISCELLANEOUS) ×4 IMPLANT
GAUZE 4X4 16PLY ~~LOC~~+RFID DBL (SPONGE) ×2 IMPLANT
GAUZE SPONGE 4X4 12PLY STRL (GAUZE/BANDAGES/DRESSINGS) ×4 IMPLANT
GAUZE SPONGE 4X4 12PLY STRL LF (GAUZE/BANDAGES/DRESSINGS) IMPLANT
GLOVE BIO SURGEON STRL SZ7 (GLOVE) ×4 IMPLANT
GLOVE BIOGEL M STRL SZ7.5 (GLOVE) ×4 IMPLANT
GLOVE BIOGEL PI IND STRL 6 (GLOVE) IMPLANT
GLOVE SURG SS PI 7.5 STRL IVOR (GLOVE) IMPLANT
GOWN STRL REUS W/ TWL LRG LVL3 (GOWN DISPOSABLE) ×8 IMPLANT
GOWN STRL REUS W/ TWL XL LVL3 (GOWN DISPOSABLE) ×4 IMPLANT
GOWN STRL REUS W/TWL LRG LVL3 (GOWN DISPOSABLE) ×12
GOWN STRL REUS W/TWL XL LVL3 (GOWN DISPOSABLE) ×4
HEMOSTAT POWDER SURGIFOAM 1G (HEMOSTASIS) ×4 IMPLANT
HEMOSTAT SURGICEL 2X14 (HEMOSTASIS) IMPLANT
INSERT SUTURE HOLDER (MISCELLANEOUS) ×2 IMPLANT
KIT BASIN OR (CUSTOM PROCEDURE TRAY) ×2 IMPLANT
KIT SUCTION CATH 14FR (SUCTIONS) ×2 IMPLANT
KIT TURNOVER KIT B (KITS) ×2 IMPLANT
KIT VASOVIEW HEMOPRO 2 VH 4000 (KITS) ×2 IMPLANT
LEAD PACING MYOCARDI (MISCELLANEOUS) ×2 IMPLANT
MARKER GRAFT CORONARY BYPASS (MISCELLANEOUS) ×6 IMPLANT
NS IRRIG 1000ML POUR BTL (IV SOLUTION) ×10 IMPLANT
PACK E OPEN HEART (SUTURE) ×2 IMPLANT
PACK OPEN HEART (CUSTOM PROCEDURE TRAY) ×2 IMPLANT
PAD ARMBOARD 7.5X6 YLW CONV (MISCELLANEOUS) ×4 IMPLANT
PAD ELECT DEFIB RADIOL ZOLL (MISCELLANEOUS) ×2 IMPLANT
PENCIL BUTTON HOLSTER BLD 10FT (ELECTRODE) ×2 IMPLANT
POSITIONER HEAD DONUT 9IN (MISCELLANEOUS) ×2 IMPLANT
PUNCH AORTIC ROTATE 4.0MM (MISCELLANEOUS) ×2 IMPLANT
SET MPS 3-ND DEL (MISCELLANEOUS) IMPLANT
SPONGE T-LAP 18X18 ~~LOC~~+RFID (SPONGE) ×8 IMPLANT
SUPPORT HEART JANKE-BARRON (MISCELLANEOUS) ×2 IMPLANT
SUT BONE WAX W31G (SUTURE) ×2 IMPLANT
SUT ETHIBOND X763 2 0 SH 1 (SUTURE) ×4 IMPLANT
SUT MNCRL AB 3-0 PS2 18 (SUTURE) ×4 IMPLANT
SUT MNCRL AB 4-0 PS2 18 (SUTURE) IMPLANT
SUT PDS AB 1 CTX 36 (SUTURE) ×4 IMPLANT
SUT PROLENE 4 0 SH DA (SUTURE) ×2 IMPLANT
SUT PROLENE 5 0 C 1 36 (SUTURE) ×6 IMPLANT
SUT PROLENE 7 0 BV1 MDA (SUTURE) ×2 IMPLANT
SUT STEEL 6MS V (SUTURE) ×4 IMPLANT
SUT STEEL STERNAL CCS#1 18IN (SUTURE) IMPLANT
SUT VIC AB 2-0 CT1 27 (SUTURE) ×2
SUT VIC AB 2-0 CT1 TAPERPNT 27 (SUTURE) IMPLANT
SYSTEM SAHARA CHEST DRAIN ATS (WOUND CARE) ×2 IMPLANT
TAPE CLOTH SURG 4X10 WHT LF (GAUZE/BANDAGES/DRESSINGS) IMPLANT
TAPE PAPER 2X10 WHT MICROPORE (GAUZE/BANDAGES/DRESSINGS) IMPLANT
TOWEL GREEN STERILE (TOWEL DISPOSABLE) ×2 IMPLANT
TOWEL GREEN STERILE FF (TOWEL DISPOSABLE) ×2 IMPLANT
TRAY FOLEY SLVR 16FR TEMP STAT (SET/KITS/TRAYS/PACK) ×2 IMPLANT
TUBING LAP HI FLOW INSUFFLATIO (TUBING) ×2 IMPLANT
UNDERPAD 30X36 HEAVY ABSORB (UNDERPADS AND DIAPERS) ×2 IMPLANT
WATER STERILE IRR 1000ML POUR (IV SOLUTION) ×4 IMPLANT

## 2023-04-19 NOTE — Progress Notes (Signed)
Patient ID: Debra Jordan, female   DOB: 1951-02-21, 72 y.o.   MRN: [id number]  TCTS Evening Rounds:   Hemodynamically stable  CI = 2.1  Extubated and on bipap per CCM.  Urine output good  CT output low  CBC    Component Value Date/Time   WBC 21.4 (H) 04/19/2023 1311   RBC 3.31 (L) 04/19/2023 1311   HGB 10.2 (L) 04/19/2023 1743   HCT 30.0 (L) 04/19/2023 1743   PLT 155 04/19/2023 1311   MCV 86.7 04/19/2023 1311   MCH 26.6 04/19/2023 1311   MCHC 30.7 04/19/2023 1311   RDW 14.9 04/19/2023 1311   LYMPHSABS 1.8 04/16/2023 1318   MONOABS 0.5 04/16/2023 1318   EOSABS 0.2 04/16/2023 1318   BASOSABS 0.1 04/16/2023 1318     BMET    Component Value Date/Time   NA 143 04/19/2023 1743   K 4.0 04/19/2023 1743   CL 105 04/19/2023 1140   CO2 26 04/16/2023 1318   GLUCOSE 138 (H) 04/19/2023 1140   BUN 17 04/19/2023 1140   CREATININE 1.20 (H) 04/19/2023 1140   CALCIUM 9.4 04/16/2023 1318   GFRNONAA 38 (L) 04/16/2023 1318     A/P:  Stable postop course. Continue current plans

## 2023-04-19 NOTE — Progress Notes (Signed)
MD and anesthesia made aware that patient has not had a covid test this admission. Patient is asymptomatic. Ok to proceed with surgery today per MD and anesthesia without covid test in place.

## 2023-04-19 NOTE — Anesthesia Procedure Notes (Signed)
Arterial Line Insertion Start/End8/28/2024 7:15 AM Performed by: Val Eagle, MD, Samara Deist, CRNA, CRNA  Preanesthetic checklist: patient identified, IV checked, site marked, risks and benefits discussed, surgical consent, monitors and equipment checked, pre-op evaluation, timeout performed and anesthesia consent Patient sedated Left, radial was placed Catheter size: 20 G Hand hygiene performed , maximum sterile barriers used  and Seldinger technique used  Attempts: 1 Procedure performed without using ultrasound guided technique. Following insertion, dressing applied and Biopatch. Patient tolerated the procedure well with no immediate complications.

## 2023-04-19 NOTE — Plan of Care (Signed)
  Problem: Education: Goal: Understanding of CV disease, CV risk reduction, and recovery process will improve Outcome: Progressing   Problem: Health Behavior/Discharge Planning: Goal: Ability to safely manage health-related needs after discharge will improve Outcome: Progressing   Problem: Activity: Goal: Ability to tolerate increased activity will improve Outcome: Progressing   Problem: Education: Goal: Ability to describe self-care measures that may prevent or decrease complications (Diabetes Survival Skills Education) will improve Outcome: Progressing   Problem: Coping: Goal: Ability to adjust to condition or change in health will improve Outcome: Progressing

## 2023-04-19 NOTE — Procedures (Signed)
Extubation Procedure Note  Patient Details:   Name: PIERSON HUNNICUTT DOB: [birth year] MRN: [id number]   Airway Documentation:    Vent end date: 04/19/23 Vent end time: 1630   Evaluation  O2 sats: stable throughout Complications: No apparent complications Patient did tolerate procedure well. Bilateral Breath Sounds: Clear, Diminished   Yes  Patient extubated per MD. Positive cuff leak. No stridor noted. ABG not in rapid heart wean protocol range. MD okay to extubate to bipap after 1 of bicarb. NIF -20, VC 0.5L. Vitals are stable on 10/5 40% bipap. RN at bedside.  Harmon Dun Hasten Sweitzer 04/19/2023, 4:47 PM

## 2023-04-19 NOTE — Brief Op Note (Signed)
04/16/2023 - 04/19/2023  11:02 AM  PATIENT:  Debra Jordan  72 y.o. female  PRE-OPERATIVE DIAGNOSIS:  Coronary artery disease  POST-OPERATIVE DIAGNOSIS:  Coronary artery disease  PROCEDURES:  CORONARY ARTERY BYPASS GRAFTING (CABG) X2 USING LEFT INTERNAL MAMMARY ARTERY AND ENDOSCOPICALLY HARVESTED RIGHT GREATER SAPHENOUS VEIN  LIMA-LAD SVG-OM  Vein harvest time: Vein prep time:  TRANSESOPHAGEAL ECHOCARDIOGRAM   SURGEON: Corliss Skains, MD - Primary  PHYSICIAN ASSISTANT: Nocole Zammit  ASSISTANTSFarrel Demark, Scrub Person         Tanda Rockers, RN, RN First Assistant   ANESTHESIA:   general  EBL:   BLOOD ADMINISTERED:none  DRAINS:  Mediastinal and left pleural drains.  JP drain right leg    LOCAL MEDICATIONS USED:  NONE  COUNTS:  Correct  DICTATION: .Dragon Dictation  PLAN OF CARE: Admit to inpatient   PATIENT DISPOSITION:  ICU - intubated and hemodynamically stable.   Delay start of Pharmacological VTE agent (>24hrs) due to surgical blood loss or risk of bleeding: yes

## 2023-04-19 NOTE — Anesthesia Procedure Notes (Signed)
Procedure Name: Intubation Date/Time: 04/19/2023 8:01 AM  Performed by: Owens Loffler, RNPre-anesthesia Checklist: Patient identified, Emergency Drugs available, Suction available and Patient being monitored Patient Re-evaluated:Patient Re-evaluated prior to induction Oxygen Delivery Method: Circle system utilized Preoxygenation: Pre-oxygenation with 100% oxygen Induction Type: IV induction Ventilation: Oral airway inserted - appropriate to patient size Laryngoscope Size: Glidescope and 3 Grade View: Grade I Tube type: Oral Tube size: 8.0 mm Number of attempts: 1 Airway Equipment and Method: Stylet, Oral airway and Video-laryngoscopy Placement Confirmation: ETT inserted through vocal cords under direct vision, positive ETCO2 and breath sounds checked- equal and bilateral Secured at: 23 cm Tube secured with: Tape Dental Injury: Teeth and Oropharynx as per pre-operative assessment  Difficulty Due To: Difficulty was anticipated and Difficult Airway- due to limited oral opening Comments: Used glidescope per prior anesthesia airway notes- patient presented with very limited oral opening. G1 view with glidescope and successful intubation.

## 2023-04-19 NOTE — Consult Note (Signed)
NAME:  Debra Jordan, MRN:  [id number], DOB:  10-23-1950, LOS: 1 ADMISSION DATE:  04/16/2023, CONSULTATION DATE: 04/19/2023 REFERRING MD: Dr. 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 and morbid obesity who was admitted with acute NSTEMI, underwent left heart cath which showed multivessel coronary artery disease, echocardiogram showed normal EF with diastolic dysfunction, today patient underwent CABG x 2, remained intubated and transferred to ICU, PCCM was consulted for help evaluation medical management  Pertinent  Medical History   Past Medical History:  Diagnosis Date   ADHD (attention deficit hyperactivity disorder)    Arthritis    gout   Bilateral shoulder pain 05/13/2022   Blue toe syndrome 10/06/2011   Broken shoulder    And tailbone    Bursitis    Hip and Shoulder per patient   Cataract    Mixed form OU   Chronic fatigue, unspecified 03/03/2019   Chronic kidney disease    hx of left nephrectomy   Diabetic retinopathy    NPDR OU   Dyspnea    Since having COVID   Elevated troponin 09/17/2021   Epigastric pain 09/17/2021   Family history of Alzheimer's disease 10/12/2022   Fatty liver    Fibromyalgia    Gallstones without obstruction of gallbladder 10/04/2021   Gout 09/06/2011   History of COVID-19 03/03/2019   History of headache    History of kidney stones    History of nephrectomy, unilateral 09/06/2011   History of PCOS    History of skin cancer    Skin Cancer   History of venous thrombosis and embolism    Left great toe   HTN (hypertension) 09/06/2011   Hypercalcemia 09/17/2021   Hyperlipidemia    Hypersomnolence    Hypertensive retinopathy    OU   Hypertriglyceridemia 09/06/2011   Hypothyroidism 09/06/2011   Ischemia of extremity 09/06/2011   Lacunar infarction    left periatrial white matter   Mass of  finger of right hand 03/11/2019   Migraine headache 09/06/2011   Neuropathy    feet bilateral   Obesity    Osteoarthritis, hand 08/06/2020   Pain in right hip 08/22/1972   Pain in right shoulder 08/06/2020   Pain in toe of right foot 09/05/2011   Pancreatic cyst 10/04/2021   Pneumonia    Seizure disorder    Somatic symptom disorder 11/07/2022   Subjective memory complaints 11/07/2022   Trouble swallowing    Type II diabetes mellitus 09/06/2011     Significant Hospital Events: Including procedures, antibiotic start and stop dates in addition to other pertinent events     Interim History / Subjective:  As above  Objective   Blood pressure (!) 149/76, pulse 74, temperature 98.7 F (37.1 C), temperature source Oral, resp. rate 19, height 5' 4.25" (1.632 m), weight 109.6 kg, SpO2 96%.        Intake/Output Summary (Last 24 hours) at 04/19/2023 1238 Last data filed at 04/19/2023 1216 Gross per 24 hour  Intake 1891.81 ml  Output 2070 ml  Net -178.19 ml   Filed Weights   04/17/23 0345 04/18/23 0451 04/19/23 0436  Weight: 110.3 kg 108.9 kg 109.6 kg    Examination: General: Crtitically ill-appearing morbidly obese female, orally intubated HEENT: [place]/AT, eyes anicteric.  ETT and OGT in place Neuro: Sedated, not following commands.  Eyes are closed.  Pupils 3 mm bilateral reactive to  light Chest: Central sternotomy incision looks clean and dry, well-dressed coarse breath sounds, no wheezes or rhonchi.  Mediastinal and chest tube in place Heart: Regular rate and rhythm, no murmurs or gallops Abdomen: Soft, nondistended, bowel sounds present Skin: No rash  Labs and images were reviewed  Resolved Hospital Problem list     Assessment & Plan:  Coronary artery disease s/p CABG x 2 Continue aspirin and statin Chest tube management TCTS Continue to titrate Precedex with RASS goal 0/-1 Continue pain control with tramadol, oxycodone and morphine Closely monitor chest tube  output  Acute respiratory insufficiency, postop Continue on protective ventilation VAP prevention bundle in place Rapid weaning protocol ordered is in place  Chronic HFpEF Monitor intake and output EF 5% with grade 2 diastolic dysfunction GDMT once able to tolerate  Hypertension Holding antihypertensive for now, as patient is requiring low-dose phenylephrine  Hyperlipidemia Continue atorvastatin  Diabetes type 2 Patient hemoglobin A1c is 6.8 Currently on insulin infusion, monitor fingerstick with goal 140-180  Expected perioperative blood loss anemia Monitor H/H and PLT counts  CKD stage IIIb status post left nephrectomy of pelvic kidney Serum creatinine is at baseline, monitor intake and output avoid nephrotoxic agent  Morbid obesity Diet and exercise counseling as appropriate   Best Practice (right click and "Reselect all SmartList Selections" daily)   Diet/type: NPO DVT prophylaxis: SCD GI prophylaxis: PPI Lines: Central line, Arterial Line, and yes and it is still needed Foley:  Yes, and it is still needed Code Status:  full code Last date of multidisciplinary goals of care discussion [Per primary team]   Labs   CBC: Recent Labs  Lab 04/16/23 1318 04/17/23 0302 04/18/23 0543 04/19/23 0809 04/19/23 1042 04/19/23 1045 04/19/23 1113 04/19/23 1140 04/19/23 1143  WBC 7.7 7.3 9.0  --   --   --   --   --   --   NEUTROABS 5.1  --   --   --   --   --   --   --   --   HGB 12.9 12.4 13.1   < > 7.8* 8.2* 8.2* 8.2* 8.5*  HCT 41.1 39.4 41.7   < > 26.0* 24.0* 24.0* 24.0* 25.0*  MCV 86.9 85.1 86.3  --   --   --   --   --   --   PLT 216 205 207  --  PLATELET CLUMPS NOTED ON SMEAR, COUNT APPEARS DECREASED  --   --   --   --    < > = values in this interval not displayed.    Basic Metabolic Panel: Recent Labs  Lab 04/16/23 1318 04/19/23 0809 04/19/23 0815 04/19/23 0949 04/19/23 1016 04/19/23 1023 04/19/23 1045 04/19/23 1113 04/19/23 1140 04/19/23 1143   NA 140 142   < > 140   < > 139 137 139 138 138  K 4.5 4.2   < > 4.1   < > 5.6* 5.1 5.7* 5.5* 5.4*  CL 102 106  --  107  --   --  105  --  105  --   CO2 26  --   --   --   --   --   --   --   --   --   GLUCOSE 208* 148*  --  115*  --   --  93  --  138*  --   BUN 18 18  --  17  --   --  17  --  17  --   CREATININE 1.48* 1.30*  --  1.30*  --   --  1.20*  --  1.20*  --   CALCIUM 9.4  --   --   --   --   --   --   --   --   --    < > = values in this interval not displayed.   GFR: Estimated Creatinine Clearance: 53.8 mL/min (A) (by C-G formula based on SCr of 1.2 mg/dL (H)). Recent Labs  Lab 04/16/23 1318 04/17/23 0302 04/18/23 0543  WBC 7.7 7.3 9.0    Liver Function Tests: Recent Labs  Lab 04/16/23 1318  AST 24  ALT 21  ALKPHOS 57  BILITOT 0.7  PROT 6.7  ALBUMIN 3.7   Recent Labs  Lab 04/16/23 1318  LIPASE 30   No results for input(s): "AMMONIA" in the last 168 hours.  ABG    Component Value Date/Time   PHART 7.404 04/19/2023 1143   PCO2ART 37.6 04/19/2023 1143   PO2ART 413 (H) 04/19/2023 1143   HCO3 23.5 04/19/2023 1143   TCO2 25 04/19/2023 1143   ACIDBASEDEF 1.0 04/19/2023 1143   O2SAT 100 04/19/2023 1143     Coagulation Profile: No results for input(s): "INR", "PROTIME" in the last 168 hours.  Cardiac Enzymes: No results for input(s): "CKTOTAL", "CKMB", "CKMBINDEX", "TROPONINI" in the last 168 hours.  HbA1C: Hgb A1c MFr Bld  Date/Time Value Ref Range Status  04/16/2023 05:50 PM 6.8 (H) 4.8 - 5.6 % Final    Comment:    (NOTE) Pre diabetes:          5.7%-6.4%  Diabetes:              >6.4%  Glycemic control for   <7.0% adults with diabetes   10/13/2021 01:06 PM 7.7 (H) 4.8 - 5.6 % Final    Comment:    (NOTE)         Prediabetes: 5.7 - 6.4         Diabetes: >6.4         Glycemic control for adults with diabetes: <7.0     CBG: Recent Labs  Lab 04/18/23 0700 04/18/23 1053 04/18/23 1544 04/18/23 2103 04/19/23 0652  GLUCAP 147* 111* 125*  126* 112*    Review of Systems:   Unable to obtain as patient is intubated and sedated  Past Medical History:  She,  has a past medical history of ADHD (attention deficit hyperactivity disorder), Arthritis, Bilateral shoulder pain (05/13/2022), Blue toe syndrome (10/06/2011), Broken shoulder, Bursitis, Cataract, Chronic fatigue, unspecified (03/03/2019), Chronic kidney disease, Diabetic retinopathy, Dyspnea, Elevated troponin (09/17/2021), Epigastric pain (09/17/2021), Family history of Alzheimer's disease (10/12/2022), Fatty liver, Fibromyalgia, Gallstones without obstruction of gallbladder (10/04/2021), Gout (09/06/2011), History of COVID-19 (03/03/2019), History of headache, History of kidney stones, History of nephrectomy, unilateral (09/06/2011), History of PCOS, History of skin cancer, History of venous thrombosis and embolism, HTN (hypertension) (09/06/2011), Hypercalcemia (09/17/2021), Hyperlipidemia, Hypersomnolence, Hypertensive retinopathy, Hypertriglyceridemia (09/06/2011), Hypothyroidism (09/06/2011), Ischemia of extremity (09/06/2011), Lacunar infarction, Mass of finger of right hand (03/11/2019), Migraine headache (09/06/2011), Neuropathy, Obesity, Osteoarthritis, hand (08/06/2020), Pain in right hip (08/22/1972), Pain in right shoulder (08/06/2020), Pain in toe of right foot (09/05/2011), Pancreatic cyst (10/04/2021), Pneumonia, Seizure disorder, Somatic symptom disorder (11/07/2022), Subjective memory complaints (11/07/2022), Trouble swallowing, and Type II diabetes mellitus (09/06/2011).   Surgical History:   Past Surgical History:  Procedure Laterality Date   ABDOMINAL HYSTERECTOMY     CHOLECYSTECTOMY N/A 10/22/2021  Procedure: LAPAROSCOPIC CHOLECYSTECTOMY;  Surgeon: Fritzi Mandes, MD;  Location: [facility] OR;  Service: General;  Laterality: N/A;   HERNIA REPAIR     LEFT HEART CATH AND CORONARY ANGIOGRAPHY N/A 04/17/2023   Procedure: LEFT HEART CATH AND CORONARY ANGIOGRAPHY;  Surgeon:  Yates Decamp, MD;  Location: [facility] INVASIVE CV LAB;  Service: Cardiovascular;  Laterality: N/A;  7:30 case if possible   LYMPH NODE BIOPSY     NASAL SINUS SURGERY     NEPHRECTOMY Left 2002   left nephrectomy   TONSILLECTOMY  1969   TUBAL LIGATION       Social History:   reports that she has never smoked. She has never used smokeless tobacco. She reports that she does not drink alcohol and does not use drugs.   Family History:  Her family history includes Alzheimer's disease in an other family member; Cancer in her father; Dementia in an other family member; Diabetes in her daughter and mother; Healthy in her brother, brother, and brother; Other in her mother; Stroke in her maternal grandmother and mother.   Allergies Allergies  Allergen Reactions   Penicillins Hives and Shortness Of Breath    angioedema   Statins Other (See Comments)    Severe myalgia and abdominal cramps  Other Reaction(s): Arthralgias   Formaldehyde Other (See Comments)   Norvasc [Amlodipine]     Difficulty breathing    Sulfa Antibiotics Nausea Only    Other Reaction(s): GI Intolerance   Latex Rash    Occasional SOB     Home Medications  Prior to Admission medications   Medication Sig Start Date End Date Taking? Authorizing Provider  cyclobenzaprine (FLEXERIL) 10 MG tablet Take 10 mg by mouth at bedtime.   Yes [provider]  DULoxetine (CYMBALTA) 60 MG capsule Take 60 mg by mouth daily. 01/26/23  Yes [provider]  famotidine (PEPCID) 20 MG tablet Take 20 mg by mouth daily.   Yes [provider]  insulin glargine, 2 Unit Dial, (TOUJEO MAX SOLOSTAR) 300 UNIT/ML Solostar Pen Inject 54 Units into the skin 2 (two) times daily.   Yes [provider]  insulin lispro (HUMALOG KWIKPEN) 200 UNIT/ML KwikPen Inject 10-25 Units into the skin 3 (three) times daily with meals as needed (high blood sugar).   Yes [provider]  levothyroxine (SYNTHROID) 112 MCG tablet Take 112  mcg by mouth at bedtime.   Yes [provider]  losartan (COZAAR) 50 MG tablet Take 50 mg by mouth 2 (two) times daily.   Yes [provider]  Multiple Vitamins-Minerals (MULTIVITAMIN ADULTS 50+) TABS Take 1 tablet by mouth daily.   Yes [provider]  naproxen sodium (ALEVE) 220 MG tablet Take 440 mg by mouth 2 (two) times daily as needed (pain).   Yes [provider]  rivaroxaban (XARELTO) 2.5 MG TABS tablet Take 1 tablet by mouth daily.   Yes [provider]  TURMERIC CURCUMIN PO Take 2 capsules by mouth daily.   Yes [provider]     Critical care time:      The patient is critically ill due to coronary artery disease status post CABG, acute respiratory insufficiency.  Critical care was necessary to treat or prevent imminent or life-threatening deterioration.  Critical care was time spent personally by me on the following activities: development of treatment plan with patient and/or surrogate as well as nursing, discussions with consultants, evaluation of patient's response to treatment, examination of patient, obtaining history from patient or  surrogate, ordering and performing treatments and interventions, ordering and review of laboratory studies, ordering and review of radiographic studies, pulse oximetry, re-evaluation of patient's condition and participation in multidisciplinary rounds.   During this encounter critical care time was devoted to patient care services described in this note for 35 minutes.     Cheri Fowler, MD [facility] See Amion for pager If no response to pager, please call [phone] until 7pm After 7pm, Please call E-link [phone]

## 2023-04-19 NOTE — Progress Notes (Signed)
  Echocardiogram Echocardiogram Transesophageal has been performed.  Debra Jordan 04/19/2023, 8:29 AM

## 2023-04-19 NOTE — Anesthesia Preprocedure Evaluation (Signed)
Anesthesia Evaluation  Patient identified by MRN, date of birth, ID band Patient awake    Reviewed: Allergy & Precautions, NPO status , Patient's Chart, lab work & pertinent test results  History of Anesthesia Complications Negative for: history of anesthetic complications  Airway Mallampati: IV  TM Distance: >3 FB Neck ROM: Full  Mouth opening: Limited Mouth Opening  Dental  (+) Chipped, Dental Advisory Given   Pulmonary shortness of breath   breath sounds clear to auscultation       Cardiovascular hypertension, Pt. on medications + angina  + CAD and + Peripheral Vascular Disease   Rhythm:Regular   1. Left ventricular ejection fraction, by estimation, is 50%. The left  ventricle has mildly decreased function. The left ventricle demonstrates  regional wall motion abnormalities (see scoring diagram/findings for  description). Left ventricular diastolic   parameters are consistent with Grade II diastolic dysfunction  (pseudonormalization). Apical and septal hypokinesis; anterior wall not  well visualized.   2. Right ventricular systolic function is normal. The right ventricular  size is normal. Tricuspid regurgitation signal is inadequate for assessing  PA pressure.   3. Left atrial size was mildly dilated.   4. The mitral valve is normal in structure. Trivial mitral valve  regurgitation. No evidence of mitral stenosis.   5. The aortic valve was not well visualized. Aortic valve regurgitation  is not visualized.   6. The inferior vena cava is normal in size with greater than 50%  respiratory variability, suggesting right atrial pressure of 3 mmHg.     Left Heart Catheterization 04/17/23:  Hemodynamic data: LV 163/9, EDP 18 mmHg.  EDP mildly elevated.  Ao 152/79, mean 112 mmHg.  No pressure gradient across the aortic valve. RCA: Small, nondominant. LM: Distal left main is about 70% stenosed. LCx: Very large caliber vessel and  a dominant vessel.  Gives origin to large OM1 which has a proximal long segment ulcerated 70 to 80% stenosis.  There is a moderate-sized OM 2 with mild diffuse disease.  Distally gives origin to PL and PDA branches with mild disease. LAD: It is a moderate caliber vessel with severe disease from the ostium all the way to the proximal to mid segment.  Gives origin to 2 moderate-sized D1 in the proximal to mid segment and several smaller diagonals in the mid to distal segment.  Proximal LAD is diffusely diseased by about 70% followed by a focal 95% stenosis just before the origin of D1.      Impression: Severe distal left main stenosis of 70 to 80% followed by proximal LAD diffuse 70 to 80% followed by a 99% focal stenosis, moderate to large sized OM1 with a ulcerated 80% stenosis and there is mild diffuse disease in the rest of the vessels.  Given diabetic state, patient will benefit from CABG and surgical consultation will be obtained.    Neuro/Psych Seizures -,  PSYCHIATRIC DISORDERS       Neuromuscular disease    GI/Hepatic negative GI ROS, Neg liver ROS,,,  Endo/Other  diabetes, Insulin DependentHypothyroidism  Morbid obesity  Renal/GU Renal InsufficiencyRenal diseaseLab Results      Component                Value               Date                      NA  142                 04/19/2023                K                        4.3                 04/19/2023                CO2                      26                  04/16/2023                GLUCOSE                  148 (H)             04/19/2023                BUN                      18                  04/19/2023                CREATININE               1.30 (H)            04/19/2023                CALCIUM                  9.4                 04/16/2023                GFRNONAA                 38 (L)              04/16/2023                Musculoskeletal  (+) Arthritis ,  Fibromyalgia -  Abdominal   Peds   Hematology negative hematology ROS (+) Lab Results      Component                Value               Date                      WBC                      9.0                 04/18/2023                HGB                      13.9                04/19/2023                HCT  41.0                04/19/2023                MCV                      86.3                04/18/2023                PLT                      207                 04/18/2023              Anesthesia Other Findings   Reproductive/Obstetrics                              Anesthesia Physical Anesthesia Plan  ASA: 4  Anesthesia Plan: General   Post-op Pain Management:    Induction: Intravenous  PONV Risk Score and Plan: 3 and Ondansetron and Treatment may vary due to age or medical condition  Airway Management Planned: Oral ETT  Additional Equipment: Arterial line, CVP, TEE and Ultrasound Guidance Line Placement  Intra-op Plan:   Post-operative Plan: Post-operative intubation/ventilation  Informed Consent: I have reviewed the patients History and Physical, chart, labs and discussed the procedure including the risks, benefits and alternatives for the proposed anesthesia with the patient or authorized representative who has indicated his/her understanding and acceptance.     Dental advisory given  Plan Discussed with: CRNA  Anesthesia Plan Comments:          Anesthesia Quick Evaluation

## 2023-04-19 NOTE — Anesthesia Procedure Notes (Signed)
Central Venous Catheter Insertion Performed by: Val Eagle, MD, anesthesiologist Start/End8/28/2024 6:55 AM, 04/19/2023 7:10 AM Patient location: Pre-op. Preanesthetic checklist: patient identified, IV checked, site marked, risks and benefits discussed, surgical consent, monitors and equipment checked, pre-op evaluation, timeout performed and anesthesia consent Position: supine Lidocaine 1% used for infiltration and patient sedated Hand hygiene performed  and maximum sterile barriers used  Catheter size: 9 Fr Total catheter length 10. MAC introducer Procedure performed using ultrasound guided technique. Ultrasound Notes:anatomy identified, needle tip was noted to be adjacent to the nerve/plexus identified, no ultrasound evidence of intravascular and/or intraneural injection and image(s) printed for medical record Attempts: 1 Following insertion, line sutured, dressing applied and Biopatch. Post procedure assessment: blood return through all ports, free fluid flow and no air  Patient tolerated the procedure well with no immediate complications.

## 2023-04-19 NOTE — Op Note (Signed)
[address]       [place] [address]             [phone]                                          04/19/2023 Patient:  [name] Pre-Op Dx: Left main coronary artery disease Diabetes Obesity HTN   HLP CKD Post-op Dx:  same Procedure: CABG X 2.  LIMA LAD, RSVG OM1   Endoscopic greater saphenous vein harvest on the right   Surgeon and Role:      * Tom Ragsdale, Eliezer Lofts, MD - Primary    * Gaynelle Arabian , PA-C - assisting An experienced assistant was required given the complexity of this surgery and the standard of surgical care. The assistant was needed for exposure, dissection, suctioning, retraction of delicate tissues and sutures, instrument exchange and for overall help during this procedure.    Anesthesia  general EBL:  Blood Administration: none Xclamp Time:  38 min Pump Time:   Drains: 43 F blake drain: L, mediastinal  Wires: ventricular Counts: correct   Indications: Ms. Bennye Ciliberti. Lariviere is a 72 year old female with a past history notable for well-controlled type 2 diabetes mellitus, hypertension, chronic kidney disease stage III status post left nephrectomy for pelvic kidney in 1995 and dyslipidemia with significant statin intolerance.  She was visiting her daughter who has special needs and who is currently being treated for sepsis in the [facility] ICU last evening when she developed chest discomfort radiating to her right neck.  She was encouraged to go to the emergency room for evaluation.  She reported having similar pain off and on for the past 2 weeks.  EKG obtained in the ED showed normal sinus rhythm with a left fascicular block and anterior Q waves.  Initial high-sensitivity troponin was 77 and later rose to 84.  Chest x-ray showed no acute findings.  Ms. Laspisa was started on a heparin drip and was admitted to the hospital by the cardiology service.  She remained stable with no further chest pain.  She was taken to the  Cath Lab this morning where left heart catheterization demonstrated severe multivessel coronary artery disease.  There was a 70% distal left main coronary artery stenosis followed by proximal 80% and 99% tandem lesions in the proximal LAD.  The first obtuse marginal had a long 80% stenosis.  We were asked by Dr. Jacinto Halim to see Ms. Pollinger for possible surgical coronary revascularization. Ms. Provins is currently resting in bed with heparin infusing.  She states she continues to have brief episodes of chest discomfort that come and go.  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  Small's has some arthritis in her hips and back but states that although she tires easily, this does not limit her mobility.   Findings: Good LIMA and vein.  Intramyocardial LAD.  Large OM.  Operative Technique: All invasive lines were placed in pre-op holding.  After the risks, benefits and alternatives were thoroughly discussed, the patient was brought to the operative theatre.  Anesthesia was induced, and the patient was prepped and draped in normal sterile fashion.  An appropriate surgical pause was performed, and pre-operative antibiotics were dosed accordingly.  We began with simultaneous incisions along  the right leg for harvesting of the greater saphenous vein and the chest for the sternotomy.  In regards to the sternotomy, this was carried down with bovie cautery, and the sternum was divided with a reciprocating saw.  Meticulous hemostasis was obtained.  The left internal thoracic artery was exposed and harvested in in pedicled fashion.  The patient was systemically heparinized, and the artery was divided distally, and placed in a papaverine sponge.    The sternal elevator was removed, and a retractor was placed.  The pericardium was divided in the midline and fashioned into a cradle with pericardial stitches.   After we confirmed an appropriate  ACT, the ascending aorta was cannulated in standard fashion.  The right atrial appendage was used for venous cannulation site.  Cardiopulmonary bypass was initiated, and the heart retractor was placed. The cross clamp was applied, and a dose of anterograde cardioplegia was given with good arrest of the heart.  Next we exposed the lateral wall, and found a good target on the OM1.  An end to side anastomosis with the vein graft was then created.  Finally, we exposed a good target on the  LAD, and fashioned an end to side anastomosis between it and the LITA.  We began to re-warm, and a re-animation dose of cardioplegia was given.  The heart was de-aired, and the cross clamp was removed.  Meticulous hemostasis was obtained.    A partial occludding clamp was then placed on the ascending aorta, and we created an end to side anastomosis between it and the proximal vein graft.  Rings were placed on the proximal anastomosis.  Hemostasis was obtained, and we separated from cardiopulmonary bypass without event.  The heparin was reversed with protamine.  Chest tubes and wires were placed, and the sternum was re-approximated with sternal wires.  The soft tissue and skin were re-approximated wth absorbable suture.    The patient tolerated the procedure without any immediate complications, and was transferred to the ICU in guarded condition.  Marionna Gonia Keane Scrape

## 2023-04-19 NOTE — Transfer of Care (Signed)
Immediate Anesthesia Transfer of Care Note  Patient: Debra Jordan  Procedure(s) Performed: CORONARY ARTERY BYPASS GRAFTING (CABG) X2 USING LEFT INTERNAL MAMMARY ARTERY AND ENDOSCOPICALLY HARVESTED RIGHT GREATER SAPHENOUS VEIN (Chest) TRANSESOPHAGEAL ECHOCARDIOGRAM  Patient Location: ICU  Anesthesia Type:General  Level of Consciousness: sedated and Patient remains intubated per anesthesia plan  Airway & Oxygen Therapy: Patient remains intubated per anesthesia plan and Patient placed on Ventilator (see vital sign flow sheet for setting)  Post-op Assessment: Report given to RN and Post -op Vital signs reviewed and stable  Post vital signs: Reviewed and stable  Last Vitals:  Vitals Value Taken Time  BP    Temp 36.2 C 04/19/23 1249  Pulse 64 04/19/23 1249  Resp 0 04/19/23 1249  SpO2 96 % 04/19/23 1249  Vitals shown include unfiled device data.  Last Pain:  Vitals:   04/19/23 0436  TempSrc: Oral  PainSc:          Complications:  Encounter Notable Events  Notable Event Outcome Phase Comment  Difficult to intubate - expected  Intraprocedure Filed from anesthesia note documentation.

## 2023-04-19 NOTE — Hospital Course (Addendum)
Referring: Yates Decamp, MD, [facility]  Primary Care: Creola Corn, MD  History of Present Illness:     [name] is a 72 year old female with a past history notable for well-controlled type 2 diabetes mellitus, hypertension, chronic kidney disease stage III status post left nephrectomy for pelvic kidney in 1995 and dyslipidemia with significant statin intolerance.  She was visiting her daughter who has special needs and who is currently being treated for sepsis in the [facility] ICU last evening when she developed chest discomfort radiating to her right neck.  She was encouraged to go to the emergency room for evaluation.  She reported having similar pain off and on for the past 2 weeks.  EKG obtained in the ED showed normal sinus rhythm with a left fascicular block and anterior Q waves.  Initial high-sensitivity troponin was 77 and later rose to 84.  Chest x-ray showed no acute findings.  Debra Jordan was started on a heparin drip and was admitted to the hospital by the cardiology service.  She remained stable with no further chest pain.  She was taken to the Cath Lab this morning where left heart catheterization demonstrated severe multivessel coronary artery disease.  There was a 70% distal left main coronary artery stenosis followed by proximal 80% and 99% tandem lesions in the proximal LAD.  The first obtuse marginal had a long 80% stenosis.  We were asked by Dr. Jacinto Halim to see Debra Jordan for possible surgical coronary revascularization. Debra Jordan is currently resting in bed with heparin infusing.  She states she continues to have brief episodes of chest discomfort that come and go.  She is the full-time caregiver for her 67 year old daughter who has developmental cognitive impairment and who is an amputee.  Debra Jordan has some arthritis in her hips and back but states that, although she tires easily, this does not limit her mobility.  Coronary bypass grafting was offered to Debra Jordan and she decide to proceed.     Hospital Course: Debra Jordan remained stable after the left heart cath and was taken to the OR on 04/15/2023 where CABG x 2 was accomplished.  Following the procedure, she was taken to the ICU in stable condition.  She initially had stable vital signs and hemodynamics.  She remained in a stable sinus rhythm.  She was weaned from the ventilator and extubated using the standard protocols by 5 PM on the day of surgery.  On postop day 1, she was mobilized to the chair and the Neo-Synephrine was weaned off.  Around mid afternoon on postop day 1, she became hypotensive and oliguric which progressed to anuria.  Levophed was started for blood pressure support with appropriate response she did not have a prolonged period of hypotension prior to this.  On postop day 2, she was less responsive but would intermittently follow commands and answer questions.  This was felt to be a multifactorial metabolic encephalopathy related to hypercarbia, mild uremia, as well as cumulative effect of the narcotic analgesic and methocarbamol.  Albumin was given for volume expansion in order to facilitate weaning of vasopressor support.  We minimize the narcotics and held the methocarbamol.  She had acute worsening of her chronic renal insufficiency with creatinine rising to 2.6 on postop day 2.  She became anuric and was treated with additional albumin.  She underwent SLP evaluation which showed mild dysphagia.  It was recommended she be placed on a dysphagia 1 diet.

## 2023-04-19 NOTE — Progress Notes (Signed)
1652:  Wheezing auscultated  Dr Merrily Pew notified.  Orders received.  Marland Kitchen

## 2023-04-19 NOTE — Discharge Summary (Incomplete)
Physician Discharge Summary  Debra Jordan ID: Debra Jordan MRN: [id number] DOB/AGE: [birth year] 70 y.o.  Admit date: 04/04/2023 Discharge date:05/03/2023  Admission Diagnoses:  Left main and multi-vessel coronary artery disease Unstable angina pectoris  Type 2 diabetes mellitus Hypertension Hypothyroidism S/p left nephrectomy Chronic kidney disease stage 3 History of ADHD Statin intolerance   Discharge Diagnoses:   Left main and multi-vessel coronary artery disease Unstable angina pectoris  Type 2 diabetes mellitus Hypertension Hypothyroidism S/p left nephrectomy Chronic kidney disease stage 3 History of ADHD Statin intolerance S/p CABG x 2    Discharged Condition: deceased  Referring: Yates Decamp, MD, [facility]  Primary Care: Creola Corn, MD  History of Present Illness:     Debra Jordan is a 72 year old female with a past history notable for well-controlled type 2 diabetes mellitus, hypertension, chronic kidney disease stage III status post left nephrectomy for pelvic kidney in 1995 and dyslipidemia with significant statin intolerance.  She was visiting her daughter who has special needs and who is currently being treated for sepsis in the [facility] ICU last evening when she developed chest discomfort radiating to her right neck.  She was encouraged to go to the emergency room for evaluation.  She reported having similar pain off and on for the past 2 weeks.  EKG obtained in the ED showed normal sinus rhythm with a left fascicular block and anterior Q waves.  Initial high-sensitivity troponin was 77 and later rose to 84.  Chest x-ray showed no acute findings.  Debra Jordan was started on a heparin drip and was admitted to the hospital by the cardiology service.  She remained stable with no further chest pain.  She was taken to the Cath Lab this morning where left heart catheterization demonstrated severe multivessel coronary artery disease.  There was a 70% distal left main coronary  artery stenosis followed by proximal 80% and 99% tandem lesions in the proximal LAD.  The first obtuse marginal had a long 80% stenosis.  We were asked by Dr. Jacinto Halim to see Debra Jordan for possible surgical coronary revascularization. Debra Jordan is currently resting in bed with heparin infusing.  She states she continues to have brief episodes of chest discomfort that come and go.  She is the full-time caregiver for her 41 year old daughter who has developmental cognitive impairment and who is an amputee.  Debra Jordan has some arthritis in her hips and back but states that, although she tires easily, this does not limit her mobility.  Coronary bypass grafting was offered to Debra Jordan and she decide to proceed.    Hospital Course: Debra Jordan remained stable after the left heart cath and was taken to the OR on 04/03/2023 where CABG x 2 was accomplished.  Following the procedure, she was taken to the ICU in stable condition.  She initially had stable vital signs and hemodynamics.  She remained in a stable sinus rhythm.  She was weaned from the ventilator and extubated using the standard protocols by 5 PM on the day of surgery.  On postop day 1, she was mobilized to the chair and the Neo-Synephrine was weaned off.  Around mid afternoon on postop day 1, she became hypotensive and oliguric which progressed to anuria.  Levophed was started for blood pressure support with appropriate response she did not have a prolonged period of hypotension prior to this.  On postop day 2, she was less responsive but would intermittently follow commands and answer questions.  This was felt to  be a multifactorial metabolic encephalopathy related to hypercarbia, mild uremia, as well as cumulative effect of the narcotic analgesic and methocarbamol.  Albumin was given for volume expansion in order to facilitate weaning of vasopressor support.  We minimize the narcotics and held the methocarbamol.  She had acute worsening of her chronic renal  insufficiency with creatinine rising to 2.6 on postop day 2.  She became anuric and was treated with additional albumin.  She underwent SLP evaluation which showed mild dysphagia.  It was recommended she be placed on a dysphagia 1 diet.  She was started on Midodrine for BP support.  Debra Jordan began refusing medications and oral intake.  PT evaluation was performed and recommended SNF placement at discharge.  The Debra Jordan had issues with lingering weakness and delirium.  She developed complaints of chest pain.  EKG was obtained and showed ST elevation in the lateral leads.  The Debra Jordan developed a PEA arrest early morning of May 15, 2023 immediately after EKG.  Debra Jordan underwent CPR for approximately 40 minutes without ROSC.  The Debra Jordan passed away at 0317 at 05-15-2023.  Consults: pulmonary/intensive care  Significant Diagnostic Studies: angiography:  Left Heart Catheterization 04/11/2023:  Hemodynamic data: LV 163/9, EDP 18 mmHg.  EDP mildly elevated.  Ao 152/79, mean 112 mmHg.  No pressure gradient across the aortic valve. RCA: Small, nondominant. LM: Distal left main is about 70% stenosed. LCx: Very large caliber vessel and a dominant vessel.  Gives origin to large OM1 which has a proximal long segment ulcerated 70 to 80% stenosis.  There is a moderate-sized OM 2 with mild diffuse disease.  Distally gives origin to PL and PDA branches with mild disease. LAD: It is a moderate caliber vessel with severe disease from the ostium all the way to the proximal to mid segment.  Gives origin to 2 moderate-sized D1 in the proximal to mid segment and several smaller diagonals in the mid to distal segment.  Proximal LAD is diffusely diseased by about 70% followed by a focal 95% stenosis just before the origin of D1.      Impression: Severe distal left main stenosis of 70 to 80% followed by proximal LAD diffuse 70 to 80% followed by a 99% focal stenosis, moderate to large sized OM1 with a ulcerated 80% stenosis and there  is mild diffuse disease in the rest of the vessels.  Given diabetic state, Debra Jordan will benefit from CABG and surgical consultation will be obtained.  Treatments: surgery:   04/10/2023 Debra Jordan:  Jeanie Jordan Pre-Op Dx: Left main coronary artery disease Diabetes Obesity HTN   HLP CKD Post-op Dx:  same Procedure: CABG X 2.  LIMA LAD, RSVG OM1   Endoscopic greater saphenous vein harvest on the right     Surgeon and Role:      * Lightfoot, Eliezer Lofts, MD - Primary    * Gaynelle Arabian , PA-C - assisting An experienced assistant was required given the complexity of this surgery and the standard of surgical care. The assistant was needed for exposure, dissection, suctioning, retraction of delicate tissues and sutures, instrument exchange and for overall help during this procedure.     Disposition:  Deceased, [place]   Allergies as of 05-15-2023       Reactions   Penicillins Hives, Shortness Of Breath   angioedema   Statins Other (See Comments)   Severe myalgia and abdominal cramps Other Reaction(s): Arthralgias   Formaldehyde Other (See Comments)   Norvasc [amlodipine]    Difficulty breathing  Sulfa Antibiotics Nausea Only   Other Reaction(s): GI Intolerance   Latex Rash   Occasional SOB        Medication List     ASK your doctor about these medications    cyclobenzaprine 10 MG tablet Commonly known as: FLEXERIL Take 10 mg by mouth at bedtime.   DULoxetine 60 MG capsule Commonly known as: CYMBALTA Take 60 mg by mouth daily.   famotidine 20 MG tablet Commonly known as: PEPCID Take 20 mg by mouth daily.   HumaLOG KwikPen 200 UNIT/ML KwikPen Generic drug: insulin lispro Inject 10-25 Units into the skin 3 (three) times daily with meals as needed (high blood sugar).   levothyroxine 112 MCG tablet Commonly known as: SYNTHROID Take 112 mcg by mouth at bedtime.   losartan 50 MG tablet Commonly known as: COZAAR Take 50 mg by mouth 2 (two) times daily.    Multivitamin Adults 50+ Tabs Take 1 tablet by mouth daily.   naproxen sodium 220 MG tablet Commonly known as: ALEVE Take 440 mg by mouth 2 (two) times daily as needed (pain).   Toujeo Max SoloStar 300 UNIT/ML Solostar Pen Generic drug: insulin glargine (2 Unit Dial) Inject 54 Units into the skin 2 (two) times daily.   TURMERIC CURCUMIN PO Take 2 capsules by mouth daily.   Xarelto 2.5 MG Tabs tablet Generic drug: rivaroxaban Take 1 tablet by mouth daily.          Signed:   I was not involved in care of this Debra Jordan, note dictated on information from documentation in chart.   Jayzon Taras, PA-C 04/25/2023, 10:02 AM

## 2023-04-19 NOTE — Anesthesia Procedure Notes (Signed)
Central Venous Catheter Insertion Performed by: Val Eagle, MD, anesthesiologist Start/End8/28/2024 7:10 AM, 04/19/2023 7:12 AM Patient location: Pre-op. Preanesthetic checklist: patient identified, IV checked, site marked, risks and benefits discussed, surgical consent, monitors and equipment checked, pre-op evaluation, timeout performed and anesthesia consent Position: supine Lidocaine 1% used for infiltration and patient sedated Hand hygiene performed  and maximum sterile barriers used  Central line was placed.Triple lumen Procedure performed without using ultrasound guided technique. Attempts: 1 Following insertion, dressing applied. Post procedure assessment: blood return through all ports, free fluid flow and no air  Patient tolerated the procedure well with no immediate complications. Additional procedure comments: Triple lumen placed through mac introducer.

## 2023-04-19 NOTE — Progress Notes (Signed)
     [address]       [place] [address]             [phone]       No events Vitals:   04/19/23 0013 04/19/23 0436  BP:  (!) 149/76  Pulse:    Resp:    Temp: 98.5 F (36.9 C) 98.7 F (37.1 C)  SpO2:     Sedated Sinus   OR today for CABG  Debra Jordan O Alezander Dimaano

## 2023-04-19 NOTE — Plan of Care (Addendum)
Extubated at 1630.  Currently on bipap.    1740:  Dr Merrily Pew notified of abg.  At bedside.  Orders received.

## 2023-04-19 NOTE — Discharge Instructions (Signed)
Discharge Instructions:  1. You may shower, please wash incisions daily with soap and water and keep dry.  If you wish to cover wounds with dressing you may do so but please keep clean and change daily.  No tub baths or swimming until incisions have completely healed.  If your incisions become red or develop any drainage please call our office at [phone]  2. No Driving until cleared by Dr. Lightfoot's office and you are no longer using narcotic pain medications  3. Monitor your weight daily.. Please use the same scale and weigh at same time... If you gain 5-10 lbs in 48 hours with associated lower extremity swelling, please contact our office at [phone]  4. Fever of 101.5 for at least 24 hours with no source, please contact our office at [phone]  5. Activity- up as tolerated, please walk at least 3 times per day.  Avoid strenuous activity, no lifting, pushing, or pulling with your arms over 8-10 lbs for a minimum of 6 weeks  6. If any questions or concerns arise, please do not hesitate to contact our office at [phone] 

## 2023-04-20 ENCOUNTER — Encounter (HOSPITAL_COMMUNITY): Payer: Self-pay | Admitting: Thoracic Surgery (Cardiothoracic Vascular Surgery)

## 2023-04-20 ENCOUNTER — Inpatient Hospital Stay (HOSPITAL_COMMUNITY): Payer: Medicare Other

## 2023-04-20 DIAGNOSIS — I25110 Atherosclerotic heart disease of native coronary artery with unstable angina pectoris: Secondary | ICD-10-CM | POA: Diagnosis not present

## 2023-04-20 DIAGNOSIS — Z951 Presence of aortocoronary bypass graft: Secondary | ICD-10-CM | POA: Diagnosis not present

## 2023-04-20 LAB — CBC
HCT: 29.3 % — ABNORMAL LOW (ref 36.0–46.0)
HCT: 29.6 % — ABNORMAL LOW (ref 36.0–46.0)
HCT: 30.9 % — ABNORMAL LOW (ref 36.0–46.0)
Hemoglobin: 9.1 g/dL — ABNORMAL LOW (ref 12.0–15.0)
Hemoglobin: 9.2 g/dL — ABNORMAL LOW (ref 12.0–15.0)
Hemoglobin: 9.3 g/dL — ABNORMAL LOW (ref 12.0–15.0)
MCH: 26.8 pg (ref 26.0–34.0)
MCH: 27.1 pg (ref 26.0–34.0)
MCH: 27.1 pg (ref 26.0–34.0)
MCHC: 31.1 g/dL (ref 30.0–36.0)
MCHC: 31.1 g/dL (ref 30.0–36.0)
MCHC: 30.1 g/dL (ref 30.0–36.0)
MCV: 86.2 fL (ref 80.0–100.0)
MCV: 87.1 fL (ref 80.0–100.0)
MCV: 90.1 fL (ref 80.0–100.0)
Platelets: 146 10*3/uL — ABNORMAL LOW (ref 150–400)
Platelets: 183 10*3/uL (ref 150–400)
Platelets: 218 10*3/uL (ref 150–400)
RBC: 3.4 MIL/uL — ABNORMAL LOW (ref 3.87–5.11)
RBC: 3.4 MIL/uL — ABNORMAL LOW (ref 3.87–5.11)
RBC: 3.43 MIL/uL — ABNORMAL LOW (ref 3.87–5.11)
RDW: 15.1 % (ref 11.5–15.5)
RDW: 15.6 % — ABNORMAL HIGH (ref 11.5–15.5)
RDW: 15.7 % — ABNORMAL HIGH (ref 11.5–15.5)
WBC: 17.1 10*3/uL — ABNORMAL HIGH (ref 4.0–10.5)
WBC: 18.4 10*3/uL — ABNORMAL HIGH (ref 4.0–10.5)
WBC: 22.6 10*3/uL — ABNORMAL HIGH (ref 4.0–10.5)
nRBC: 0 % (ref 0.0–0.2)
nRBC: 0 % (ref 0.0–0.2)
nRBC: 0.1 % (ref 0.0–0.2)

## 2023-04-20 LAB — POCT I-STAT 7, (LYTES, BLD GAS, ICA,H+H)
Acid-base deficit: 5 mmol/L — ABNORMAL HIGH (ref 0.0–2.0)
Acid-base deficit: 5 mmol/L — ABNORMAL HIGH (ref 0.0–2.0)
Acid-base deficit: 4 mmol/L — ABNORMAL HIGH (ref 0.0–2.0)
Acid-base deficit: 4 mmol/L — ABNORMAL HIGH (ref 0.0–2.0)
Bicarbonate: 21.9 mmol/L (ref 20.0–28.0)
Bicarbonate: 22.5 mmol/L (ref 20.0–28.0)
Bicarbonate: 22.5 mmol/L (ref 20.0–28.0)
Bicarbonate: 22.5 mmol/L (ref 20.0–28.0)
Calcium, Ion: 1.13 mmol/L — ABNORMAL LOW (ref 1.15–1.40)
Calcium, Ion: 1.14 mmol/L — ABNORMAL LOW (ref 1.15–1.40)
Calcium, Ion: 1.19 mmol/L (ref 1.15–1.40)
Calcium, Ion: 1.21 mmol/L (ref 1.15–1.40)
HCT: 28 % — ABNORMAL LOW (ref 36.0–46.0)
HCT: 29 % — ABNORMAL LOW (ref 36.0–46.0)
HCT: 25 % — ABNORMAL LOW (ref 36.0–46.0)
HCT: 25 % — ABNORMAL LOW (ref 36.0–46.0)
Hemoglobin: 9.5 g/dL — ABNORMAL LOW (ref 12.0–15.0)
Hemoglobin: 9.9 g/dL — ABNORMAL LOW (ref 12.0–15.0)
Hemoglobin: 8.5 g/dL — ABNORMAL LOW (ref 12.0–15.0)
Hemoglobin: 8.5 g/dL — ABNORMAL LOW (ref 12.0–15.0)
O2 Saturation: 96 %
O2 Saturation: 85 %
O2 Saturation: 96 %
O2 Saturation: 94 %
Patient temperature: 98.1
Patient temperature: 98.2
Patient temperature: 99.1
Patient temperature: 98.7
Potassium: 4.5 mmol/L (ref 3.5–5.1)
Potassium: 4.9 mmol/L (ref 3.5–5.1)
Potassium: 4.5 mmol/L (ref 3.5–5.1)
Potassium: 4.4 mmol/L (ref 3.5–5.1)
Sodium: 139 mmol/L (ref 135–145)
Sodium: 138 mmol/L (ref 135–145)
Sodium: 138 mmol/L (ref 135–145)
Sodium: 139 mmol/L (ref 135–145)
TCO2: 23 mmol/L (ref 22–32)
TCO2: 24 mmol/L (ref 22–32)
TCO2: 24 mmol/L (ref 22–32)
TCO2: 24 mmol/L (ref 22–32)
pCO2 arterial: 45 mmHg (ref 32–48)
pCO2 arterial: 49 mmHg — ABNORMAL HIGH (ref 32–48)
pCO2 arterial: 46.8 mmHg (ref 32–48)
pCO2 arterial: 48.6 mmHg — ABNORMAL HIGH (ref 32–48)
pH, Arterial: 7.294 — ABNORMAL LOW (ref 7.35–7.45)
pH, Arterial: 7.269 — ABNORMAL LOW (ref 7.35–7.45)
pH, Arterial: 7.291 — ABNORMAL LOW (ref 7.35–7.45)
pH, Arterial: 7.274 — ABNORMAL LOW (ref 7.35–7.45)
pO2, Arterial: 93 mmHg (ref 83–108)
pO2, Arterial: 57 mmHg — ABNORMAL LOW (ref 83–108)
pO2, Arterial: 93 mmHg (ref 83–108)
pO2, Arterial: 84 mmHg (ref 83–108)

## 2023-04-20 LAB — BASIC METABOLIC PANEL
Anion gap: 11 (ref 5–15)
Anion gap: 14 (ref 5–15)
BUN: 15 mg/dL (ref 8–23)
BUN: 23 mg/dL (ref 8–23)
CO2: 24 mmol/L (ref 22–32)
CO2: 21 mmol/L — ABNORMAL LOW (ref 22–32)
Calcium: 8 mg/dL — ABNORMAL LOW (ref 8.9–10.3)
Calcium: 8.1 mg/dL — ABNORMAL LOW (ref 8.9–10.3)
Chloride: 105 mmol/L (ref 98–111)
Chloride: 103 mmol/L (ref 98–111)
Creatinine, Ser: 1.32 mg/dL — ABNORMAL HIGH (ref 0.44–1.00)
Creatinine, Ser: 2.25 mg/dL — ABNORMAL HIGH (ref 0.44–1.00)
GFR, Estimated: 44 mL/min — ABNORMAL LOW (ref >60)
GFR, Estimated: 23 mL/min — ABNORMAL LOW (ref >60)
Glucose, Bld: 144 mg/dL — ABNORMAL HIGH (ref 70–99)
Glucose, Bld: 233 mg/dL — ABNORMAL HIGH (ref 70–99)
Potassium: 4.1 mmol/L (ref 3.5–5.1)
Potassium: 4.7 mmol/L (ref 3.5–5.1)
Sodium: 140 mmol/L (ref 135–145)
Sodium: 138 mmol/L (ref 135–145)

## 2023-04-20 LAB — CREATININE, SERUM
Creatinine, Ser: 1.81 mg/dL — ABNORMAL HIGH (ref 0.44–1.00)
GFR, Estimated: 30 mL/min — ABNORMAL LOW (ref >60)

## 2023-04-20 LAB — MAGNESIUM
Magnesium: 2.6 mg/dL — ABNORMAL HIGH (ref 1.7–2.4)
Magnesium: 2.7 mg/dL — ABNORMAL HIGH (ref 1.7–2.4)

## 2023-04-20 LAB — PHOSPHORUS: Phosphorus: 4.8 mg/dL — ABNORMAL HIGH (ref 2.5–4.6)

## 2023-04-20 LAB — GLUCOSE, CAPILLARY
Glucose-Capillary: 130 mg/dL — ABNORMAL HIGH (ref 70–99)
Glucose-Capillary: 133 mg/dL — ABNORMAL HIGH (ref 70–99)
Glucose-Capillary: 134 mg/dL — ABNORMAL HIGH (ref 70–99)
Glucose-Capillary: 140 mg/dL — ABNORMAL HIGH (ref 70–99)
Glucose-Capillary: 145 mg/dL — ABNORMAL HIGH (ref 70–99)
Glucose-Capillary: 148 mg/dL — ABNORMAL HIGH (ref 70–99)
Glucose-Capillary: 152 mg/dL — ABNORMAL HIGH (ref 70–99)
Glucose-Capillary: 167 mg/dL — ABNORMAL HIGH (ref 70–99)
Glucose-Capillary: 218 mg/dL — ABNORMAL HIGH (ref 70–99)

## 2023-04-20 LAB — CG4 I-STAT (LACTIC ACID)
Lactic Acid, Venous: 2.6 mmol/L (ref 0.5–1.9)
Lactic Acid, Venous: 1.3 mmol/L (ref 0.5–1.9)

## 2023-04-20 MED ORDER — PHENYLEPHRINE HCL-NACL 20-0.9 MG/250ML-% IV SOLN
INTRAVENOUS | Status: AC
  Filled 2023-04-20: qty 250

## 2023-04-20 MED ORDER — ENOXAPARIN SODIUM 40 MG/0.4ML IJ SOSY
40.0000 mg | PREFILLED_SYRINGE | Freq: Every day | INTRAMUSCULAR | Status: DC
  Administered 2023-04-20 – 2023-04-21 (×2): 40 mg via SUBCUTANEOUS
  Filled 2023-04-20 (×2): qty 0.4

## 2023-04-20 MED ORDER — ORAL CARE MOUTH RINSE
15.0000 mL | OROMUCOSAL | Status: DC | PRN
  Administered 2023-04-20: 15 mL via OROMUCOSAL

## 2023-04-20 MED ORDER — ORAL CARE MOUTH RINSE
15.0000 mL | OROMUCOSAL | Status: DC
  Administered 2023-04-20 – 2023-04-22 (×11): 15 mL via OROMUCOSAL

## 2023-04-20 MED ORDER — INSULIN DETEMIR 100 UNIT/ML ~~LOC~~ SOLN
10.0000 [IU] | Freq: Two times a day (BID) | SUBCUTANEOUS | Status: DC
  Administered 2023-04-20 – 2023-04-21 (×3): 10 [IU] via SUBCUTANEOUS
  Filled 2023-04-20 (×4): qty 0.1

## 2023-04-20 MED ORDER — RIVAROXABAN 2.5 MG PO TABS: 2.5000 mg | ORAL_TABLET | Freq: Every day | ORAL | Status: DC

## 2023-04-20 MED ORDER — METHOCARBAMOL 500 MG PO TABS
500.0000 mg | ORAL_TABLET | Freq: Three times a day (TID) | ORAL | Status: DC
  Administered 2023-04-20 – 2023-04-21 (×4): 500 mg via ORAL
  Filled 2023-04-20 (×7): qty 1

## 2023-04-20 MED ORDER — CLOPIDOGREL BISULFATE 75 MG PO TABS
75.0000 mg | ORAL_TABLET | Freq: Every day | ORAL | Status: DC
  Filled 2023-04-20: qty 1

## 2023-04-20 MED ORDER — ASPIRIN 81 MG PO CHEW
81.0000 mg | CHEWABLE_TABLET | Freq: Every day | ORAL | Status: DC
  Administered 2023-04-21: 81 mg via ORAL
  Filled 2023-04-20: qty 1

## 2023-04-20 MED ORDER — PHENYLEPHRINE HCL-NACL 20-0.9 MG/250ML-% IV SOLN
0.0000 ug/min | INTRAVENOUS | Status: DC
  Administered 2023-04-20: 20 ug/min via INTRAVENOUS

## 2023-04-20 MED ORDER — LIDOCAINE 5 % EX PTCH
1.0000 | MEDICATED_PATCH | CUTANEOUS | Status: DC
  Administered 2023-04-20 – 2023-04-21 (×2): 1 via TRANSDERMAL
  Filled 2023-04-20 (×2): qty 1

## 2023-04-20 MED ORDER — CALCIUM GLUCONATE-NACL 2-0.675 GM/100ML-% IV SOLN
2.0000 g | Freq: Once | INTRAVENOUS | Status: AC
  Administered 2023-04-20: 2000 mg via INTRAVENOUS
  Filled 2023-04-20: qty 100

## 2023-04-20 MED ORDER — CLOPIDOGREL BISULFATE 75 MG PO TABS
75.0000 mg | ORAL_TABLET | Freq: Every day | ORAL | Status: DC
  Administered 2023-04-21: 75 mg via ORAL
  Filled 2023-04-20: qty 1

## 2023-04-20 MED ORDER — ASPIRIN 81 MG PO CHEW
81.0000 mg | CHEWABLE_TABLET | Freq: Every day | ORAL | Status: DC
  Filled 2023-04-20: qty 1

## 2023-04-20 MED ORDER — NOREPINEPHRINE 4 MG/250ML-% IV SOLN
INTRAVENOUS | Status: AC
  Administered 2023-04-20: 4 mg
  Filled 2023-04-20: qty 250

## 2023-04-20 MED ORDER — INSULIN ASPART 100 UNIT/ML IJ SOLN
0.0000 [IU] | INTRAMUSCULAR | Status: DC
  Administered 2023-04-20: 3 [IU] via SUBCUTANEOUS
  Administered 2023-04-20: 5 [IU] via SUBCUTANEOUS
  Administered 2023-04-20: 3 [IU] via SUBCUTANEOUS
  Administered 2023-04-21: 8 [IU] via SUBCUTANEOUS
  Administered 2023-04-21 (×2): 3 [IU] via SUBCUTANEOUS
  Administered 2023-04-21 (×2): 5 [IU] via SUBCUTANEOUS
  Administered 2023-04-21 – 2023-04-22 (×8): 3 [IU] via SUBCUTANEOUS

## 2023-04-20 MED ORDER — ASPIRIN 81 MG PO CHEW
324.0000 mg | CHEWABLE_TABLET | Freq: Every day | ORAL | Status: AC
  Administered 2023-04-20: 324 mg via ORAL
  Filled 2023-04-20: qty 4

## 2023-04-20 MED ORDER — NOREPINEPHRINE 4 MG/250ML-% IV SOLN
0.0000 ug/min | INTRAVENOUS | Status: DC
  Administered 2023-04-20: 10 ug/min via INTRAVENOUS
  Administered 2023-04-20: 9 ug/min via INTRAVENOUS
  Administered 2023-04-21: 8 ug/min via INTRAVENOUS
  Filled 2023-04-20: qty 250

## 2023-04-20 MED FILL — Lidocaine HCl Local Preservative Free (PF) Inj 2%: INTRAMUSCULAR | Qty: 14 | Status: AC

## 2023-04-20 MED FILL — Potassium Chloride Inj 2 mEq/ML: INTRAVENOUS | Qty: 40 | Status: AC

## 2023-04-20 MED FILL — Heparin Sodium (Porcine) Inj 1000 Unit/ML: Qty: 1000 | Status: AC

## 2023-04-20 NOTE — Progress Notes (Addendum)
Patient became lethargic, hypotensive requiring increasing vasopressor support  Evaluated at bedside, she was complaining of surgical chest pain, received few doses of morphine and other pain meds, she opens eyes with vocal stimuli, following commands but generalized weak  ABG was done which showed pH 7.2, pCO2 49 Lactate was done it is 2.6   Will switch phenylephrine to norepinephrine Send full set of labs including BMP, CBC, mag and Phos Will place her on BiPAP We will give her 2 bottle of albumin   Critical care time spent 32 minutes

## 2023-04-20 NOTE — Progress Notes (Signed)
NAME:  Debra Jordan, MRN:  [id number], DOB:  [birth year], LOS: 2 ADMISSION DATE:  04/16/2023, CONSULTATION DATE: 04/19/2023 REFERRING MD: Dr. 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 and morbid obesity who was admitted with acute NSTEMI, underwent left heart cath which showed multivessel coronary artery disease, echocardiogram showed normal EF with diastolic dysfunction, today patient underwent CABG x 2, remained intubated and transferred to ICU, PCCM was consulted for help evaluation medical management  Pertinent  Medical History   Past Medical History:  Diagnosis Date   ADHD (attention deficit hyperactivity disorder)    Arthritis    gout   Bilateral shoulder pain 05/13/2022   Blue toe syndrome 10/06/2011   Broken shoulder    And tailbone    Bursitis    Hip and Shoulder per patient   Cataract    Mixed form OU   Chronic fatigue, unspecified 03/03/2019   Chronic kidney disease    hx of left nephrectomy   Diabetic retinopathy    NPDR OU   Dyspnea    Since having COVID   Elevated troponin 09/17/2021   Epigastric pain 09/17/2021   Family history of Alzheimer's disease 10/12/2022   Fatty liver    Fibromyalgia    Gallstones without obstruction of gallbladder 10/04/2021   Gout 09/06/2011   History of COVID-19 03/03/2019   History of headache    History of kidney stones    History of nephrectomy, unilateral 09/06/2011   History of PCOS    History of skin cancer    Skin Cancer   History of venous thrombosis and embolism    Left great toe   HTN (hypertension) 09/06/2011   Hypercalcemia 09/17/2021   Hyperlipidemia    Hypersomnolence    Hypertensive retinopathy    OU   Hypertriglyceridemia 09/06/2011   Hypothyroidism 09/06/2011   Ischemia of extremity 09/06/2011   Lacunar infarction    left periatrial white matter   Mass of  finger of right hand 03/11/2019   Migraine headache 09/06/2011   Neuropathy    feet bilateral   Obesity    Osteoarthritis, hand 08/06/2020   Pain in right hip 08/22/1972   Pain in right shoulder 08/06/2020   Pain in toe of right foot 09/05/2011   Pancreatic cyst 10/04/2021   Pneumonia    Seizure disorder    Somatic symptom disorder 11/07/2022   Subjective memory complaints 11/07/2022   Trouble swallowing    Type II diabetes mellitus 09/06/2011     Significant Hospital Events: Including procedures, antibiotic start and stop dates in addition to other pertinent events     Interim History / Subjective:  Patient was successfully extubated per rapid weaning protocol Remained on BiPAP overnight, currently on 5 L nasal cannula oxygen, tolerating well Continue to complain of surgical pain  Objective   Blood pressure 118/75, pulse 88, temperature 99.1 F (37.3 C), resp. rate (!) 22, height 5' 4.25" (1.632 m), weight 114.2 kg, SpO2 95%. CVP:  [7 mmHg-32 mmHg] 22 mmHg CO:  [4 L/min-5.8 L/min] 5.1 L/min CI:  [1.9 L/min/m2-2.7 L/min/m2] 2.4 L/min/m2  Vent Mode: PCV;BIPAP FiO2 (%):  [40 %-50 %] 40 % Set Rate:  [4 bmp-16 bmp] 12 bmp Vt Set:  [440 mL] 440 mL PEEP:  [5 cmH20] 5 cmH20 Pressure Support:  [10 cmH20] 10 cmH20 Plateau Pressure:  [16 cmH20-18 cmH20] 16 cmH20   Intake/Output Summary (  Last 24 hours) at 04/20/2023 0902 Last data filed at 04/20/2023 0800 Gross per 24 hour  Intake 3003.88 ml  Output 3215 ml  Net -211.12 ml   Filed Weights   04/18/23 0451 04/19/23 0436 04/20/23 0410  Weight: 108.9 kg 109.6 kg 114.2 kg    Examination: General: Morbidly obese elderly female, lying on the bed HEENT: [place]/AT, eyes anicteric.  moist mucus membranes Neuro: Alert, awake following commands Chest: Central sternotomy wound looks clean and dry, coarse breath sounds, no wheezes or rhonchi.  Mediastinal chest tube in place Heart: Regular rate and rhythm, no murmurs or gallops Abdomen:  Soft, nontender, nondistended, bowel sounds present Skin: No rash Extremities: Drain in place on right lower extremity with bloody drainage  Labs and images were reviewed  Resolved Hospital Problem list     Assessment & Plan:  Coronary artery disease s/p CABG x 2 Continue aspirin and statin Holding Plavix for now until pacer wires are out Chest tube management TCTS Continue pain control with tramadol, oxycodone and morphine Closely monitor chest tube output  Acute respiratory insufficiency, postop Patient was successfully extubated per rapid weaning protocol She was extubated on BiPAP, remained on BiPAP overnight She will need sleep study as an outpatient for possible OSA Currently on 5 L nasal cannula oxygen Encourage incentive spirometry  Chronic HFpEF Monitor intake and output EF 5% with grade 2 diastolic dysfunction GDMT once able to tolerate  Hypertension Continue metoprolol  Hyperlipidemia Continue atorvastatin  Diabetes type 2 Patient hemoglobin A1c is 6.8 Transition insulin infusion to sliding scale with CBG goal 140-180  Expected perioperative blood loss anemia Monitor H/H  CKD stage IIIb status post left nephrectomy of pelvic kidney Serum creatinine is at baseline, monitor intake and output avoid nephrotoxic agent Serum creatinine is at baseline between 1.2-1.3  Morbid obesity Diet and exercise counseling provided   Best Practice (right click and "Reselect all SmartList Selections" daily)   Diet/type: Regular consistency DVT prophylaxis: Enoxaparin GI prophylaxis: PPI Lines: Central line, Arterial Line, and yes and it is still needed Foley:  Yes, and it is still needed Code Status:  full code Last date of multidisciplinary goals of care discussion [Per primary team]   Labs   CBC: Recent Labs  Lab 04/16/23 1318 04/17/23 0302 04/18/23 0543 04/19/23 0809 04/19/23 1042 04/19/23 1045 04/19/23 1303 04/19/23 1311 04/19/23 1620 04/19/23 1743  04/20/23 0336  WBC 7.7 7.3 9.0  --   --   --   --  21.4*  --   --  17.1*  NEUTROABS 5.1  --   --   --   --   --   --   --   --   --   --   HGB 12.9 12.4 13.1   < > 7.8*   < > 9.5* 8.8* 10.2* 10.2* 9.1*  HCT 41.1 39.4 41.7   < > 26.0*   < > 28.0* 28.7* 30.0* 30.0* 29.3*  MCV 86.9 85.1 86.3  --   --   --   --  86.7  --   --  86.2  PLT 216 205 207  --  PLATELET CLUMPS NOTED ON SMEAR, COUNT APPEARS DECREASED  --   --  155  --   --  146*   < > = values in this interval not displayed.    Basic Metabolic Panel: Recent Labs  Lab 04/16/23 1318 04/19/23 0809 04/19/23 0949 04/19/23 1016 04/19/23 1045 04/19/23 1113 04/19/23 1140 04/19/23 1143 04/19/23 1303 04/19/23  1620 04/19/23 1743 04/19/23 1821 04/20/23 0336  NA 140   < > 140   < > 137   < > 138   < > 140 142 143 141 140  K 4.5   < > 4.1   < > 5.1   < > 5.5*   < > 4.7 4.3 4.0 4.1 4.1  CL 102   < > 107  --  105  --  105  --   --   --   --  108 105  CO2 26  --   --   --   --   --   --   --   --   --   --  22 24  GLUCOSE 208*   < > 115*  --  93  --  138*  --   --   --   --  137* 144*  BUN 18   < > 17  --  17  --  17  --   --   --   --  12 15  CREATININE 1.48*   < > 1.30*  --  1.20*  --  1.20*  --   --   --   --  1.19* 1.32*  CALCIUM 9.4  --   --   --   --   --   --   --   --   --   --  7.5* 8.0*  MG  --   --   --   --   --   --   --   --   --   --   --  3.0* 2.6*   < > = values in this interval not displayed.   GFR: Estimated Creatinine Clearance: 50.1 mL/min (A) (by C-G formula based on SCr of 1.32 mg/dL (H)). Recent Labs  Lab 04/17/23 0302 04/18/23 0543 04/19/23 1311 04/20/23 0336  WBC 7.3 9.0 21.4* 17.1*    Liver Function Tests: Recent Labs  Lab 04/16/23 1318  AST 24  ALT 21  ALKPHOS 57  BILITOT 0.7  PROT 6.7  ALBUMIN 3.7   Recent Labs  Lab 04/16/23 1318  LIPASE 30   No results for input(s): "AMMONIA" in the last 168 hours.  ABG    Component Value Date/Time   PHART 7.290 (L) 04/19/2023 1743   PCO2ART 43.1  04/19/2023 1743   PO2ART 83 04/19/2023 1743   HCO3 20.9 04/19/2023 1743   TCO2 22 04/19/2023 1743   ACIDBASEDEF 6.0 (H) 04/19/2023 1743   O2SAT 95 04/19/2023 1743     Coagulation Profile: Recent Labs  Lab 04/19/23 1311  INR 1.4*    Cardiac Enzymes: No results for input(s): "CKTOTAL", "CKMB", "CKMBINDEX", "TROPONINI" in the last 168 hours.  HbA1C: Hgb A1c MFr Bld  Date/Time Value Ref Range Status  04/16/2023 05:50 PM 6.8 (H) 4.8 - 5.6 % Final    Comment:    (NOTE) Pre diabetes:          5.7%-6.4%  Diabetes:              >6.4%  Glycemic control for   <7.0% adults with diabetes   10/13/2021 01:06 PM 7.7 (H) 4.8 - 5.6 % Final    Comment:    (NOTE)         Prediabetes: 5.7 - 6.4         Diabetes: >6.4         Glycemic control for adults with diabetes: <  7.0     CBG: Recent Labs  Lab 04/20/23 0143 04/20/23 0246 04/20/23 0340 04/20/23 0446 04/20/23 0701  GLUCAP 148* 140* 130* 133* 134*    Cheri Fowler, MD [facility] See Amion for pager If no response to pager, please call [phone] until 7pm After 7pm, Please call E-link [phone]

## 2023-04-20 NOTE — Anesthesia Postprocedure Evaluation (Signed)
Anesthesia Post Note  Patient: Debra Jordan  Procedure(s) Performed: CORONARY ARTERY BYPASS GRAFTING (CABG) X2 USING LEFT INTERNAL MAMMARY ARTERY AND ENDOSCOPICALLY HARVESTED RIGHT GREATER SAPHENOUS VEIN (Chest) TRANSESOPHAGEAL ECHOCARDIOGRAM     Patient location during evaluation: SICU Anesthesia Type: General Level of consciousness: sedated Pain management: pain level controlled Vital Signs Assessment: post-procedure vital signs reviewed and stable Respiratory status: patient remains intubated per anesthesia plan Cardiovascular status: stable Postop Assessment: no apparent nausea or vomiting Anesthetic complications: yes   Encounter Notable Events  Notable Event Outcome Phase Comment  Difficult to intubate - expected  Intraprocedure Filed from anesthesia note documentation.    Last Vitals:  Vitals:   04/20/23 1147 04/20/23 1200  BP: (!) 93/50 (!) 80/52  Pulse: 76 74  Resp: (!) 21 17  Temp:    SpO2:  96%    Last Pain:  Vitals:   04/20/23 1152  TempSrc:   PainSc: Asleep                 Collene Schlichter

## 2023-04-20 NOTE — Plan of Care (Signed)
Problem: Education: Goal: Understanding of CV disease, CV risk reduction, and recovery process will improve Outcome: Progressing Goal: Individualized Educational Video(s) Outcome: Progressing   Problem: Activity: Goal: Ability to return to baseline activity level will improve Outcome: Progressing   Problem: Cardiovascular: Goal: Ability to achieve and maintain adequate cardiovascular perfusion will improve Outcome: Progressing Goal: Vascular access site(s) Level 0-1 will be maintained Outcome: Progressing   Problem: Health Behavior/Discharge Planning: Goal: Ability to safely manage health-related needs after discharge will improve Outcome: Progressing   Problem: Education: Goal: Understanding of cardiac disease, CV risk reduction, and recovery process will improve Outcome: Progressing Goal: Individualized Educational Video(s) Outcome: Progressing   Problem: Activity: Goal: Ability to tolerate increased activity will improve Outcome: Progressing   Problem: Cardiac: Goal: Ability to achieve and maintain adequate cardiovascular perfusion will improve Outcome: Progressing   Problem: Health Behavior/Discharge Planning: Goal: Ability to safely manage health-related needs after discharge will improve Outcome: Progressing   Problem: Education: Goal: Ability to describe self-care measures that may prevent or decrease complications (Diabetes Survival Skills Education) will improve Outcome: Progressing Goal: Individualized Educational Video(s) Outcome: Progressing   Problem: Coping: Goal: Ability to adjust to condition or change in health will improve Outcome: Progressing   Problem: Fluid Volume: Goal: Ability to maintain a balanced intake and output will improve Outcome: Progressing   Problem: Health Behavior/Discharge Planning: Goal: Ability to identify and utilize available resources and services will improve Outcome: Progressing Goal: Ability to manage health-related  needs will improve Outcome: Progressing   Problem: Metabolic: Goal: Ability to maintain appropriate glucose levels will improve Outcome: Progressing   Problem: Nutritional: Goal: Maintenance of adequate nutrition will improve Outcome: Progressing Goal: Progress toward achieving an optimal weight will improve Outcome: Progressing   Problem: Skin Integrity: Goal: Risk for impaired skin integrity will decrease Outcome: Progressing   Problem: Tissue Perfusion: Goal: Adequacy of tissue perfusion will improve Outcome: Progressing   Problem: Education: Goal: Knowledge of General Education information will improve Description: Including pain rating scale, medication(s)/side effects and non-pharmacologic comfort measures Outcome: Progressing   Problem: Health Behavior/Discharge Planning: Goal: Ability to manage health-related needs will improve Outcome: Progressing   Problem: Clinical Measurements: Goal: Ability to maintain clinical measurements within normal limits will improve Outcome: Progressing Goal: Will remain free from infection Outcome: Progressing Goal: Diagnostic test results will improve Outcome: Progressing Goal: Respiratory complications will improve Outcome: Progressing Goal: Cardiovascular complication will be avoided Outcome: Progressing   Problem: Activity: Goal: Risk for activity intolerance will decrease Outcome: Progressing   Problem: Nutrition: Goal: Adequate nutrition will be maintained Outcome: Progressing   Problem: Coping: Goal: Level of anxiety will decrease Outcome: Progressing   Problem: Elimination: Goal: Will not experience complications related to bowel motility Outcome: Progressing Goal: Will not experience complications related to urinary retention Outcome: Progressing   Problem: Pain Managment: Goal: General experience of comfort will improve Outcome: Progressing   Problem: Safety: Goal: Ability to remain free from injury will  improve Outcome: Progressing   Problem: Skin Integrity: Goal: Risk for impaired skin integrity will decrease Outcome: Progressing   Problem: Education: Goal: Will demonstrate proper wound care and an understanding of methods to prevent future damage Outcome: Progressing Goal: Knowledge of disease or condition will improve Outcome: Progressing Goal: Knowledge of the prescribed therapeutic regimen will improve Outcome: Progressing Goal: Individualized Educational Video(s) Outcome: Progressing   Problem: Activity: Goal: Risk for activity intolerance will decrease Outcome: Progressing   Problem: Cardiac: Goal: Will achieve and/or maintain hemodynamic stability Outcome: Progressing  Problem: Clinical Measurements: Goal: Postoperative complications will be avoided or minimized Outcome: Progressing   Problem: Respiratory: Goal: Respiratory status will improve Outcome: Progressing

## 2023-04-20 NOTE — Progress Notes (Signed)
Patient taken off bipap and placed on 6L [place] per MD. Vitals are stable at this time. RN at bedside.

## 2023-04-20 NOTE — Progress Notes (Addendum)
TCTS DAILY ICU PROGRESS NOTE                   [address]            [place] [address]          [phone]   1 Day Post-Op Procedure(s) (LRB): CORONARY ARTERY BYPASS GRAFTING (CABG) X2 USING LEFT INTERNAL MAMMARY ARTERY AND ENDOSCOPICALLY HARVESTED RIGHT GREATER SAPHENOUS VEIN (N/A) TRANSESOPHAGEAL ECHOCARDIOGRAM (N/A)  Total Length of Stay:  LOS: 2 days   Subjective: Extubated to BiPAP at around 5pm yesterday, transitioning to high-flow [place] now.  Alert with appropriate mental status. Having some sternal pain now.  No events over night.   Objective: Vital signs in last 24 hours: Temp:  [96.8 F (36 C)-99.3 F (37.4 C)] 99.1 F (37.3 C) (08/29 0700) Pulse Rate:  [63-113] 86 (08/29 0700) Cardiac Rhythm: Normal sinus rhythm (08/28 2000) Resp:  [0-26] 17 (08/29 0700) BP: (112-132)/(55-76) 118/75 (08/29 0445) SpO2:  [70 %-100 %] 95 % (08/29 0700) Arterial Line BP: (90-238)/(-1-84) 134/-1 (08/29 0700) FiO2 (%):  [40 %-50 %] 40 % (08/29 0302) Weight:  [114.2 kg] 114.2 kg (08/29 0410)  Filed Weights   04/18/23 0451 04/19/23 0436 04/20/23 0410  Weight: 108.9 kg 109.6 kg 114.2 kg    Weight change: 4.6 kg   Hemodynamic parameters for last 24 hours: CVP:  [7 mmHg-32 mmHg] 17 mmHg CO:  [4 L/min-5.8 L/min] 5.8 L/min CI:  [1.9 L/min/m2-2.7 L/min/m2] 2.7 L/min/m2  Intake/Output from previous day: 08/28 0701 - 08/29 0700 In: 3272.3 [I.V.:2026.2; Blood:225; IV Piggyback:1021.1] Out: 3637 [Urine:2895; Drains:37; Blood:365; Chest Tube:340]  Intake/Output this shift: No intake/output data recorded.  Current Meds: Scheduled Meds:  acetaminophen  1,000 mg Oral Q6H   Or   acetaminophen (TYLENOL) oral liquid 160 mg/5 mL  1,000 mg Per Tube Q6H   acetaminophen (TYLENOL) oral liquid 160 mg/5 mL  650 mg Per Tube Once   aspirin  324 mg Oral Once   aspirin EC  325 mg Oral Daily   Or   aspirin  324 mg Per Tube Daily   atorvastatin  20 mg Oral Daily   bisacodyl  10  mg Oral Daily   Or   bisacodyl  10 mg Rectal Daily   Chlorhexidine Gluconate Cloth  6 each Topical Daily   docusate sodium  200 mg Oral Daily   donepezil  5 mg Oral Daily   DULoxetine  60 mg Oral Daily   levothyroxine  112 mcg Oral Daily   metoCLOPramide (REGLAN) injection  10 mg Intravenous Q6H   metoprolol tartrate  12.5 mg Oral BID   Or   metoprolol tartrate  12.5 mg Per Tube BID   mouth rinse  15 mL Mouth Rinse 4 times per day   [START ON 04/21/2023] pantoprazole  40 mg Oral Daily   pantoprazole (PROTONIX) IV  40 mg Intravenous QHS   sodium chloride flush  3 mL Intravenous Q12H   Continuous Infusions:  sodium chloride 20 mL/hr at 04/20/23 0700   sodium chloride     sodium chloride     albumin human Stopped (04/19/23 1653)   dexmedetomidine (PRECEDEX) IV infusion Stopped (04/20/23 0402)   insulin 1.5 Units/hr (04/20/23 0700)   lactated ringers 10 mL/hr at 04/20/23 0700   lactated ringers 10 mL/hr at 04/19/23 1710   levofloxacin (LEVAQUIN) IV     niCARDipine     norepinephrine (LEVOPHED) Adult infusion Stopped (04/19/23 1200)   phenylephrine (NEO-SYNEPHRINE)  Adult infusion Stopped (04/19/23 1325)   PRN Meds:.sodium chloride, albumin human, dextrose, metoprolol tartrate, midazolam, morphine injection, ondansetron (ZOFRAN) IV, mouth rinse, oxyCODONE, sodium chloride flush, traMADol  General appearance: alert, cooperative, and moderate distress Neurologic: intact Heart: RRR, monitor showing NSR, no significant arrhythmias Lungs: breath sounds clear but shallow, CXR with no unexpected changes. CT drainage 10-44ml/hr past 12 hours  Abdomen: rare bowel sounds, no tenderness Extremities: all warm and well perfused. Minimal output from the RLE JP drain Wound: The sternotomy is covered with a Prevena negative pressure dressing, minor oozing from the RLE EVH incision.  Lab Results: CBC: Recent Labs    04/19/23 1311 04/19/23 1620 04/19/23 1743 04/20/23 0336  WBC 21.4*  --   --   17.1*  HGB 8.8*   < > 10.2* 9.1*  HCT 28.7*   < > 30.0* 29.3*  PLT 155  --   --  146*   < > = values in this interval not displayed.   BMET:  Recent Labs    04/19/23 1821 04/20/23 0336  NA 141 140  K 4.1 4.1  CL 108 105  CO2 22 24  GLUCOSE 137* 144*  BUN 12 15  CREATININE 1.19* 1.32*  CALCIUM 7.5* 8.0*    CMET: Lab Results  Component Value Date   WBC 17.1 (H) 04/20/2023   HGB 9.1 (L) 04/20/2023   HCT 29.3 (L) 04/20/2023   PLT 146 (L) 04/20/2023   GLUCOSE 144 (H) 04/20/2023   CHOL 215 (H) 04/16/2023   TRIG 204 (H) 04/16/2023   HDL 47 04/16/2023   LDLCALC 127 (H) 04/16/2023   ALT 21 04/16/2023   AST 24 04/16/2023   NA 140 04/20/2023   K 4.1 04/20/2023   CL 105 04/20/2023   CREATININE 1.32 (H) 04/20/2023   BUN 15 04/20/2023   CO2 24 04/20/2023   TSH 7.571 (H) 04/16/2023   INR 1.4 (H) 04/19/2023   HGBA1C 6.8 (H) 04/16/2023      PT/INR:  Recent Labs    04/19/23 1311  LABPROT 17.7*  INR 1.4*   Radiology: [facility] Chest Port 1 View  Result Date: 04/19/2023 CLINICAL DATA:  CABG. EXAM: PORTABLE CHEST 1 VIEW COMPARISON:  One-view chest x-ray 04/16/2023. FINDINGS: The endotracheal tube terminates less than 1 cm from the carina and is directed towards the right mainstem bronchus. Gastric tube extends beyond the inferior border of the film. A right IJ catheter terminates in the SVC. No pneumothorax is present. Mediastinal drain and left-sided chest tube in place. No pneumothorax is present. The heart is enlarged.  The lung volumes are low. IMPRESSION: 1. Endotracheal tube terminates less than 1 cm from the carina and is directed towards the right mainstem bronchus. 2. Other support apparatus as above. 3. Low lung volumes. These results will be called to the ordering clinician or representative by the Radiologist Assistant, and communication documented in the PACS or [employer]. Electronically Signed   By: Marin Roberts M.D.   On: 04/19/2023 13:33   EP  STUDY  Result Date: 04/19/2023 See surgical note for result.    Assessment/Plan: S/P Procedure(s) (LRB): CORONARY ARTERY BYPASS GRAFTING (CABG) X2 USING LEFT INTERNAL MAMMARY ARTERY AND ENDOSCOPICALLY HARVESTED RIGHT GREATER SAPHENOUS VEIN (N/A) TRANSESOPHAGEAL ECHOCARDIOGRAM (N/A)  -POD1 CABG x 2 for left main and 2vCAD presenting with UAP and mildly reduced LVF.  VS and hemodynamics stable, not requiring vasopressor support.  Has statin intolerance. Currently on ASA, low dose statin and BB.  Mobilize routinely.   -  PULM- no h/o tobacco use. BiPAP switched to hi-flow [place] O2 this morning. Work on pulmonary hygiene as able.   -HEME- mild expected ABL anemia and thrombocytopenia. Tolerating well and had minimal losses overnight. Monitor.  -ENDO- type 2 DM. Glucose well controlled with insulin drip. Transition to SSI today.   -GI- Abd benign, clear liquids as tolerated.  -RENAL-H/O left nephrectomy for pelvic kidney and stage 3 CKD.  Creat ~1-4--1.6 at baseline.  Wt 5-6kg+. Diurese and  monitor closely.     Leary Roca, PA-C 04/20/2023 7:50 AM   Agree with above POD 1 progression Adjusting pain meds Will restart xarelto, no plavix  Jehad Bisono O [place] Trembley

## 2023-04-21 ENCOUNTER — Inpatient Hospital Stay (HOSPITAL_COMMUNITY): Payer: Medicare Other

## 2023-04-21 DIAGNOSIS — N179 Acute kidney failure, unspecified: Secondary | ICD-10-CM

## 2023-04-21 DIAGNOSIS — R579 Shock, unspecified: Secondary | ICD-10-CM

## 2023-04-21 DIAGNOSIS — I200 Unstable angina: Secondary | ICD-10-CM

## 2023-04-21 LAB — POCT I-STAT 7, (LYTES, BLD GAS, ICA,H+H)
Acid-base deficit: 4 mmol/L — ABNORMAL HIGH (ref 0.0–2.0)
Acid-base deficit: 4 mmol/L — ABNORMAL HIGH (ref 0.0–2.0)
Bicarbonate: 23.2 mmol/L (ref 20.0–28.0)
Bicarbonate: 21.3 mmol/L (ref 20.0–28.0)
Calcium, Ion: 1.22 mmol/L (ref 1.15–1.40)
Calcium, Ion: 1.16 mmol/L (ref 1.15–1.40)
HCT: 28 % — ABNORMAL LOW (ref 36.0–46.0)
HCT: 24 % — ABNORMAL LOW (ref 36.0–46.0)
Hemoglobin: 9.5 g/dL — ABNORMAL LOW (ref 12.0–15.0)
Hemoglobin: 8.2 g/dL — ABNORMAL LOW (ref 12.0–15.0)
O2 Saturation: 95 %
O2 Saturation: 97 %
Patient temperature: 98.9
Patient temperature: 98.6
Potassium: 4.6 mmol/L (ref 3.5–5.1)
Potassium: 4.4 mmol/L (ref 3.5–5.1)
Sodium: 138 mmol/L (ref 135–145)
Sodium: 134 mmol/L — ABNORMAL LOW (ref 135–145)
TCO2: 25 mmol/L (ref 22–32)
TCO2: 22 mmol/L (ref 22–32)
pCO2 arterial: 51.5 mmHg — ABNORMAL HIGH (ref 32–48)
pCO2 arterial: 40 mmHg (ref 32–48)
pH, Arterial: 7.262 — ABNORMAL LOW (ref 7.35–7.45)
pH, Arterial: 7.334 — ABNORMAL LOW (ref 7.35–7.45)
pO2, Arterial: 90 mmHg (ref 83–108)
pO2, Arterial: 92 mmHg (ref 83–108)

## 2023-04-21 LAB — CBC
HCT: 28.4 % — ABNORMAL LOW (ref 36.0–46.0)
HCT: 28.1 % — ABNORMAL LOW (ref 36.0–46.0)
Hemoglobin: 8.7 g/dL — ABNORMAL LOW (ref 12.0–15.0)
Hemoglobin: 8.4 g/dL — ABNORMAL LOW (ref 12.0–15.0)
MCH: 27.7 pg (ref 26.0–34.0)
MCH: 27.4 pg (ref 26.0–34.0)
MCHC: 30.6 g/dL (ref 30.0–36.0)
MCHC: 29.9 g/dL — ABNORMAL LOW (ref 30.0–36.0)
MCV: 90.4 fL (ref 80.0–100.0)
MCV: 91.5 fL (ref 80.0–100.0)
Platelets: 175 10*3/uL (ref 150–400)
Platelets: 160 10*3/uL (ref 150–400)
RBC: 3.14 MIL/uL — ABNORMAL LOW (ref 3.87–5.11)
RBC: 3.07 MIL/uL — ABNORMAL LOW (ref 3.87–5.11)
RDW: 16 % — ABNORMAL HIGH (ref 11.5–15.5)
RDW: 16.1 % — ABNORMAL HIGH (ref 11.5–15.5)
WBC: 16.9 10*3/uL — ABNORMAL HIGH (ref 4.0–10.5)
WBC: 15.3 10*3/uL — ABNORMAL HIGH (ref 4.0–10.5)
nRBC: 0 % (ref 0.0–0.2)
nRBC: 0.1 % (ref 0.0–0.2)

## 2023-04-21 LAB — GLUCOSE, CAPILLARY
Glucose-Capillary: 163 mg/dL — ABNORMAL HIGH (ref 70–99)
Glucose-Capillary: 170 mg/dL — ABNORMAL HIGH (ref 70–99)
Glucose-Capillary: 171 mg/dL — ABNORMAL HIGH (ref 70–99)
Glucose-Capillary: 201 mg/dL — ABNORMAL HIGH (ref 70–99)
Glucose-Capillary: 206 mg/dL — ABNORMAL HIGH (ref 70–99)
Glucose-Capillary: 268 mg/dL — ABNORMAL HIGH (ref 70–99)

## 2023-04-21 LAB — ECHO INTRAOPERATIVE TEE
AR max vel: 2.91 cm2
AV Area VTI: 2.76 cm2
AV Area mean vel: 2.93 cm2
AV Mean grad: 2 mmHg
AV Peak grad: 3.7 mmHg
Ao pk vel: 0.96 m/s
Height: 64.25 in
MV VTI: 2.27 cm2
Weight: 3865.99 [oz_av]

## 2023-04-21 LAB — COOXEMETRY PANEL
Carboxyhemoglobin: 2.4 % — ABNORMAL HIGH (ref 0.5–1.5)
Methemoglobin: <0.7 % (ref 0.0–1.5)
O2 Saturation: 89.2 %
Total hemoglobin: <6.9 g/dL — CL (ref 12.0–16.0)

## 2023-04-21 LAB — BASIC METABOLIC PANEL
Anion gap: 9 (ref 5–15)
BUN: 30 mg/dL — ABNORMAL HIGH (ref 8–23)
CO2: 22 mmol/L (ref 22–32)
Calcium: 8.7 mg/dL — ABNORMAL LOW (ref 8.9–10.3)
Chloride: 106 mmol/L (ref 98–111)
Creatinine, Ser: 2.59 mg/dL — ABNORMAL HIGH (ref 0.44–1.00)
GFR, Estimated: 19 mL/min — ABNORMAL LOW (ref >60)
Glucose, Bld: 231 mg/dL — ABNORMAL HIGH (ref 70–99)
Potassium: 4.8 mmol/L (ref 3.5–5.1)
Sodium: 137 mmol/L (ref 135–145)

## 2023-04-21 LAB — TSH: TSH: 7.741 u[IU]/mL — ABNORMAL HIGH (ref 0.350–4.500)

## 2023-04-21 LAB — T4, FREE: Free T4: 0.93 ng/dL (ref 0.61–1.12)

## 2023-04-21 LAB — TYPE AND SCREEN
ABO/RH(D): B NEG
Antibody Screen: NEGATIVE

## 2023-04-21 LAB — AMMONIA: Ammonia: 16 umol/L (ref 9–35)

## 2023-04-21 MED ORDER — MIDODRINE HCL 5 MG PO TABS
10.0000 mg | ORAL_TABLET | Freq: Three times a day (TID) | ORAL | Status: DC
  Filled 2023-04-21: qty 2

## 2023-04-21 MED ORDER — INSULIN DETEMIR 100 UNIT/ML ~~LOC~~ SOLN
10.0000 [IU] | Freq: Two times a day (BID) | SUBCUTANEOUS | Status: DC
  Administered 2023-04-21 – 2023-04-22 (×3): 10 [IU] via SUBCUTANEOUS
  Filled 2023-04-21 (×4): qty 0.1

## 2023-04-21 MED ORDER — ACETAMINOPHEN 325 MG PO TABS
650.0000 mg | ORAL_TABLET | Freq: Four times a day (QID) | ORAL | Status: DC | PRN
  Administered 2023-04-21: 650 mg via ORAL
  Filled 2023-04-21: qty 2

## 2023-04-21 MED ORDER — ALBUMIN HUMAN 5 % IV SOLN
12.5000 g | Freq: Once | INTRAVENOUS | Status: AC
  Administered 2023-04-21: 12.5 g via INTRAVENOUS

## 2023-04-21 MED ORDER — INSULIN DETEMIR 100 UNIT/ML ~~LOC~~ SOLN
13.0000 [IU] | Freq: Two times a day (BID) | SUBCUTANEOUS | Status: DC
  Filled 2023-04-21: qty 0.13

## 2023-04-21 MED ORDER — PANTOPRAZOLE SODIUM 40 MG IV SOLR
40.0000 mg | INTRAVENOUS | Status: DC
  Administered 2023-04-21 – 2023-04-22 (×2): 40 mg via INTRAVENOUS
  Filled 2023-04-21 (×2): qty 10

## 2023-04-21 MED ORDER — INSULIN ASPART 100 UNIT/ML IJ SOLN
3.0000 [IU] | Freq: Three times a day (TID) | INTRAMUSCULAR | Status: DC
  Administered 2023-04-21 (×2): 3 [IU] via SUBCUTANEOUS

## 2023-04-21 NOTE — Inpatient Diabetes Management (Addendum)
Inpatient Diabetes Program Recommendations  AACE/ADA: New Consensus Statement on Inpatient Glycemic Control (2015)  Target Ranges:  Prepandial:   less than 140 mg/dL      Peak postprandial:   less than 180 mg/dL (1-2 hours)      Critically ill patients:  140 - 180 mg/dL   Lab Results  Component Value Date   GLUCAP 206 (H) 04/21/2023   HGBA1C 6.8 (H) 04/05/2023    Review of Glycemic Control  Latest Reference Range & Units 04/20/23 07:01 04/20/23 11:17 04/20/23 16:38 04/20/23 20:21 04/21/23 00:22 04/21/23 04:33  Glucose-Capillary 70 - 99 mg/dL 161 (H) 096 (H) 045 (H) 167 (H) 170 (H) 206 (H)   Diabetes history: DM2 Outpatient Diabetes medications:  Toujeo 54 units BID Humalog 12-25 units TID Current orders for Inpatient glycemic control:  Levemir 10 units BID Novolog 0-15 units q4   Inpatient Diabetes Program Recommendations:    Please consider:  -   Add Novolog 3 units tid meal coverage if eating >50% of meals  Will continue to follow while inpatient.  Thanks,  Christena Deem RN, MSN, BC-ADM Inpatient Diabetes Coordinator Team Pager [phone] (8a-5p)

## 2023-04-21 NOTE — Evaluation (Signed)
Clinical/Bedside Swallow Evaluation Patient Details  Name: Debra Jordan MRN: [id number] Date of Birth: [birth year]  Today's Date: 04/21/2023 Time: SLP Start Time (ACUTE ONLY): 0945 SLP Stop Time (ACUTE ONLY): 1000 SLP Time Calculation (min) (ACUTE ONLY): 15 min  Past Medical History:  Past Medical History:  Diagnosis Date   ADHD (attention deficit hyperactivity disorder)    Arthritis    gout   Bilateral shoulder pain 05/13/2022   Blue toe syndrome 10/06/2011   Broken shoulder    And tailbone    Bursitis    Hip and Shoulder per patient   Cataract    Mixed form OU   Chronic fatigue, unspecified 03/03/2019   Chronic kidney disease    hx of left nephrectomy   Diabetic retinopathy    NPDR OU   Dyspnea    Since having COVID   Elevated troponin 09/17/2021   Epigastric pain 09/17/2021   Family history of Alzheimer's disease 10/12/2022   Fatty liver    Fibromyalgia    Gallstones without obstruction of gallbladder 10/04/2021   Gout 09/06/2011   History of COVID-19 03/03/2019   History of headache    History of kidney stones    History of nephrectomy, unilateral 09/06/2011   History of PCOS    History of skin cancer    Skin Cancer   History of venous thrombosis and embolism    Left great toe   HTN (hypertension) 09/06/2011   Hypercalcemia 09/17/2021   Hyperlipidemia    Hypersomnolence    Hypertensive retinopathy    OU   Hypertriglyceridemia 09/06/2011   Hypothyroidism 09/06/2011   Ischemia of extremity 09/06/2011   Lacunar infarction    left periatrial white matter   Mass of finger of right hand 03/11/2019   Migraine headache 09/06/2011   Neuropathy    feet bilateral   Obesity    Osteoarthritis, hand 08/06/2020   Pain in right hip 08/22/1972   Pain in right shoulder 08/06/2020   Pain in toe of right foot 09/05/2011   Pancreatic cyst 10/04/2021   Pneumonia    Seizure disorder    Somatic symptom disorder 11/07/2022   Subjective memory complaints 11/07/2022    Trouble swallowing    Type II diabetes mellitus 09/06/2011   Past Surgical History:  Past Surgical History:  Procedure Laterality Date   ABDOMINAL HYSTERECTOMY     CHOLECYSTECTOMY N/A 10/22/2021   Procedure: LAPAROSCOPIC CHOLECYSTECTOMY;  Surgeon: Fritzi Mandes, MD;  Location: [facility] OR;  Service: General;  Laterality: N/A;   CORONARY ARTERY BYPASS GRAFT N/A 04/11/2023   Procedure: CORONARY ARTERY BYPASS GRAFTING (CABG) X2 USING LEFT INTERNAL MAMMARY ARTERY AND ENDOSCOPICALLY HARVESTED RIGHT GREATER SAPHENOUS VEIN;  Surgeon: Corliss Skains, MD;  Location: [facility] OR;  Service: Open Heart Surgery;  Laterality: N/A;   HERNIA REPAIR     LEFT HEART CATH AND CORONARY ANGIOGRAPHY N/A 04/15/2023   Procedure: LEFT HEART CATH AND CORONARY ANGIOGRAPHY;  Surgeon: Yates Decamp, MD;  Location: [facility] INVASIVE CV LAB;  Service: Cardiovascular;  Laterality: N/A;  7:30 case if possible   LYMPH NODE BIOPSY     NASAL SINUS SURGERY     NEPHRECTOMY Left 2002   left nephrectomy   TEE WITHOUT CARDIOVERSION N/A 04/07/2023   Procedure: TRANSESOPHAGEAL ECHOCARDIOGRAM;  Surgeon: Corliss Skains, MD;  Location: [facility] OR;  Service: Open Heart Surgery;  Laterality: N/A;   TONSILLECTOMY  1969   TUBAL LIGATION     HPI:  72 year old female with diabetes, hypertension, CKD stage IIIb status  post left nephrectomy due to pelvic kidney and morbid obesity who was admitted with acute NSTEMI, underwent left heart cath which showed multivessel coronary artery disease, echocardiogram showed normal EF with diastolic dysfunction ,s/p CABG x 2 8/28. Patient with h/o esophageal spasms. MBS complete in 2017 noted normal oropharyngeal swallow, prominent CP.    Assessment / Plan / Recommendation  Clinical Impression  Patient presents with signs of a mild oropharyngeal dysphagia during today's evaluation characterized by both delayed oral transit/prolonged mastication and cough post swallow with regular texture solids. Otherwise, patient without  indication of aspiration and what appears to be a normal oropharyngeal swallow. Note that during exam, patient is lethargic, generally slow and does not consistently respond to questions, appearing intermittently confused. Suspect that as mentation improves, patient will be able to return to a regular diet. Fow now, recommend dysphagia 1 with thin liquids. RN aware. SLP Visit Diagnosis: Dysphagia, oropharyngeal phase (R13.12)       Diet Recommendation Dysphagia 1 (Puree);Thin liquid    Liquid Administration via: Cup;Straw Medication Administration: Crushed with puree Supervision: Staff to assist with self feeding;Intermittent supervision to cue for compensatory strategies Compensations: Slow rate;Small sips/bites Postural Changes: Seated upright at 90 degrees;Remain upright for at least 30 minutes after po intake    Other  Recommendations Oral Care Recommendations: Oral care BID    Recommendations for follow up therapy are one component of a multi-disciplinary discharge planning process, led by the attending physician.  Recommendations may be updated based on patient status, additional functional criteria and insurance authorization.  Follow up Recommendations No SLP follow up         Functional Status Assessment Patient has had a recent decline in their functional status and demonstrates the ability to make significant improvements in function in a reasonable and predictable amount of time.  Frequency and Duration min 1 x/week  1 week       Prognosis Prognosis for improved oropharyngeal function: Good      Swallow Study   General HPI: 72 year old female with diabetes, hypertension, CKD stage IIIb status post left nephrectomy due to pelvic kidney and morbid obesity who was admitted with acute NSTEMI, underwent left heart cath which showed multivessel coronary artery disease, echocardiogram showed normal EF with diastolic dysfunction ,s/p CABG x 2 8/28. Patient with h/o esophageal  spasms. MBS complete in 2017 noted normal oropharyngeal swallow, prominent CP. Type of Study: Bedside Swallow Evaluation Previous Swallow Assessment: see HPI Diet Prior to this Study: NPO Temperature Spikes Noted: No Respiratory Status: Nasal cannula History of Recent Intubation: Yes Total duration of intubation (days):  (for surgery only) Behavior/Cognition: Lethargic/Drowsy;Confused;Requires cueing Oral Cavity Assessment: Within Functional Limits Oral Care Completed by SLP: Recent completion by staff Oral Cavity - Dentition: Adequate natural dentition Vision: Functional for self-feeding Self-Feeding Abilities: Needs assist Patient Positioning: Upright in bed Baseline Vocal Quality: Low vocal intensity Volitional Swallow: Able to elicit    Oral/Motor/Sensory Function Overall Oral Motor/Sensory Function: Within functional limits   Ice Chips Ice chips: Within functional limits Presentation: Spoon   Thin Liquid Thin Liquid: Within functional limits Presentation: Cup;Straw    Nectar Thick Nectar Thick Liquid: Not tested   Honey Thick Honey Thick Liquid: Not tested   Puree Puree: Within functional limits Presentation: Spoon   Solid     Solid: Impaired Presentation: Spoon Oral Phase Impairments: Impaired mastication Oral Phase Functional Implications: Prolonged oral transit Pharyngeal Phase Impairments: Cough - Immediate     Enez Monahan MA, CCC-SLP  Tomasz Steeves Meryl  04/21/2023,10:07 AM

## 2023-04-21 NOTE — Evaluation (Signed)
Occupational Therapy Evaluation Patient Details Name: Debra Jordan MRN: [id number] DOB: 15-May-1951 Today's Date: 04/21/2023   History of Present Illness 72 year old female with diabetes, hypertension, CKD stage IIIb status post left nephrectomy due to pelvic kidney and morbid obesity who was admitted with acute NSTEMI.  S/p CABGx2.   Clinical Impression   Patient admitted for the diagnosis and procedure above.  Patient putting forth limited effort upon eval.  Patient unable , or unwilling, to answer majority of prior level of function or home setup auestions.  Attempted precaution education, but patient closing eyes throughout.  Patient needing a lot of assist given poor effort.  OT will continue efforts in the acute setting to address deficits, and Patient will benefit from continued inpatient follow up therapy, <3 hours/day       If plan is discharge home, recommend the following: A lot of help with bathing/dressing/bathroom;Two people to help with walking and/or transfers;Assist for transportation;Assistance with cooking/housework;Assistance with feeding    Functional Status Assessment  Patient has had a recent decline in their functional status and demonstrates the ability to make significant improvements in function in a reasonable and predictable amount of time.  Equipment Recommendations  Other (comment) (TBD)    Recommendations for Other Services       Precautions / Restrictions Precautions Precautions: Fall;Sternal Precaution Comments: Chest Tube, JP Drain to R lower leg. Restrictions Weight Bearing Restrictions: Yes RUE Weight Bearing: Partial weight bearing LUE Weight Bearing: Partial weight bearing Other Position/Activity Restrictions: Sternal      Mobility Bed Mobility Overal bed mobility: Needs Assistance Bed Mobility: Supine to Sit     Supine to sit: Max assist, +2 for safety/equipment          Transfers Overall transfer level: Needs assistance    Transfers: Sit to/from Stand, Bed to chair/wheelchair/BSC Sit to Stand: Mod assist   Squat pivot transfers: Mod assist, +2 safety/equipment, Max assist              Balance Overall balance assessment: Needs assistance Sitting-balance support: Feet supported Sitting balance-Leahy Scale: Poor   Postural control: Posterior lean Standing balance support: Bilateral upper extremity supported Standing balance-Leahy Scale: Poor                             ADL either performed or assessed with clinical judgement   ADL Overall ADL's : Needs assistance/impaired Eating/Feeding: Moderate assistance;Sitting   Grooming: Moderate assistance;Sitting   Upper Body Bathing: Maximal assistance;Sitting   Lower Body Bathing: Total assistance;Bed level   Upper Body Dressing : Maximal assistance;Sitting   Lower Body Dressing: Total assistance;Bed level       Toileting- Clothing Manipulation and Hygiene: Total assistance;Bed level               Vision   Vision Assessment?: No apparent visual deficits     Perception Perception: Not tested       Praxis Praxis: Not tested       Pertinent Vitals/Pain Pain Assessment Pain Assessment: Faces Faces Pain Scale: Hurts little more Pain Location: Generalized with sit to stand Pain Descriptors / Indicators: Grimacing Pain Intervention(s): Monitored during session     Extremity/Trunk Assessment Upper Extremity Assessment Upper Extremity Assessment: Generalized weakness   Lower Extremity Assessment Lower Extremity Assessment: Defer to PT evaluation   Cervical / Trunk Assessment Cervical / Trunk Assessment: Other exceptions Cervical / Trunk Exceptions: Body Habitus   Communication Communication Communication: Difficulty communicating thoughts/reduced clarity  of speech   Cognition Arousal: Lethargic Behavior During Therapy: Flat affect Overall Cognitive Status: Difficult to assess                                        General Comments   VSS on O2    Exercises     Shoulder Instructions      Home Living Family/patient expects to be discharged to:: Private residence Living Arrangements: Children   Type of Home: House Home Access: Stairs to enter [employer] of Steps: ? 2 with a handrail   Home Layout: One level     Bathroom Shower/Tub: Tub/shower unit         Home Equipment: Cane - single point          Prior Functioning/Environment Prior Level of Function : Independent/Modified Independent;Driving             Mobility Comments: Walks with a SPC ADLs Comments: Cares for her daughter with special needs.  Continues to drive.        OT Problem List: Decreased strength;Decreased range of motion;Decreased activity tolerance;Impaired balance (sitting and/or standing);Decreased knowledge of precautions;Decreased safety awareness;Decreased cognition;Pain      OT Treatment/Interventions: Self-care/ADL training;Therapeutic activities;Therapeutic exercise;Patient/family education;Balance training;DME and/or AE instruction    OT Goals(Current goals can be found in the care plan section) Acute Rehab OT Goals OT Goal Formulation: Patient unable to participate in goal setting Time For Goal Achievement: 05/05/23 Potential to Achieve Goals: Fair ADL Goals Pt Will Perform Grooming: with set-up;sitting Pt Will Perform Upper Body Dressing: with min assist;sitting Pt Will Perform Lower Body Dressing: with mod assist;sit to/from stand Pt Will Transfer to Toilet: ambulating;regular height toilet;with mod assist  OT Frequency: Min 1X/week    Co-evaluation              AM-PAC OT "6 Clicks" Daily Activity     Outcome Measure Help from another person eating meals?: A Lot Help from another person taking care of personal grooming?: A Lot Help from another person toileting, which includes using toliet, bedpan, or urinal?: Total Help from another person bathing  (including washing, rinsing, drying)?: A Lot Help from another person to put on and taking off regular upper body clothing?: A Lot Help from another person to put on and taking off regular lower body clothing?: Total 6 Click Score: 10   End of Session Equipment Utilized During Treatment: Gait belt;Oxygen Nurse Communication: Mobility status  Activity Tolerance: Patient limited by lethargy Patient left: in chair;with call bell/phone within reach  OT Visit Diagnosis: Unsteadiness on feet (R26.81);Muscle weakness (generalized) (M62.81);Other symptoms and signs involving cognitive function                Time: 1135-1152 OT Time Calculation (min): 17 min Charges:  OT General Charges $OT Visit: 1 Visit OT Evaluation $OT Eval Moderate Complexity: 1 Mod  04/21/2023  RP, OTR/L  Acute Rehabilitation Services  Office:  [phone]   Suzanna Obey 04/21/2023, 12:08 PM

## 2023-04-21 NOTE — Progress Notes (Signed)
Patient ID: Debra Jordan, female   DOB: [birth year], 72 y.o.   MRN: [id number] [facility] Evening Rounds:  Hemodynamically stable off NE with BP low 100 range. Started on midodrine but has refused to take pills today.  Foley inserted today and 450 cc out.  Seen by speech therapy and put on puree diet with thin liquids.  OT saw today and PT consult pending. She has significant deconditioning and debilitation due to morbid obesity and medical co-morbidities.

## 2023-04-21 NOTE — Progress Notes (Addendum)
[facility] DAILY ICU PROGRESS NOTE                   [address]            [place] [address]          [phone]   2 Days Post-Op Procedure(s) (LRB): CORONARY ARTERY BYPASS GRAFTING (CABG) X2 USING LEFT INTERNAL MAMMARY ARTERY AND ENDOSCOPICALLY HARVESTED RIGHT GREATER SAPHENOUS VEIN (N/A) TRANSESOPHAGEAL ECHOCARDIOGRAM (N/A)  Total Length of Stay:  LOS: 3 days   Subjective:  Awake but answers questions and follows commands only intermittently. Will move all extremities.  Became hypotensive and anuric yesterday afternoon and was started on norepi. Remains anuric, ~47ml urine on last bladder scan at 2am.    Objective: Vital signs in last 24 hours: Temp:  [98 F (36.7 C)-99.5 F (37.5 C)] 98 F (36.7 C) (08/29 2300) Pulse Rate:  [63-90] 86 (08/30 0645) Cardiac Rhythm: Normal sinus rhythm (08/29 2000) Resp:  [14-30] 17 (08/30 0645) BP: (80-146)/(50-83) 133/73 (08/30 0600) SpO2:  [88 %-99 %] 98 % (08/30 0645) Arterial Line BP: (82-165)/(34-158) 117/62 (08/30 0645) FiO2 (%):  [40 %] 40 % (08/30 0310) Weight:  [114.3 kg] 114.3 kg (08/30 0500)  Filed Weights   04/13/2023 0436 04/20/23 0410 04/21/23 0500  Weight: 109.6 kg 114.2 kg 114.3 kg    Weight change: 0.1 kg   Hemodynamic parameters for last 24 hours: CVP:  [5 mmHg-35 mmHg] 13 mmHg CO:  [3.6 L/min-5.9 L/min] 4.4 L/min CI:  [1.7 L/min/m2-2.8 L/min/m2] 2.1 L/min/m2  Intake/Output from previous day: 08/29 0701 - 08/30 0700 In: 2436.1 [P.O.:540; I.V.:1067.6; IV Piggyback:818.5] Out: 256 [Urine:67; Drains:19; Chest Tube:170]  Intake/Output this shift: No intake/output data recorded.  Current Meds: Scheduled Meds:  acetaminophen  1,000 mg Oral Q6H   Or   acetaminophen (TYLENOL) oral liquid 160 mg/5 mL  1,000 mg Per Tube Q6H   acetaminophen (TYLENOL) oral liquid 160 mg/5 mL  650 mg Per Tube Once   aspirin  324 mg Oral Once   aspirin  81 mg Oral Daily   atorvastatin  20 mg Oral Daily   bisacodyl  10  mg Oral Daily   Or   bisacodyl  10 mg Rectal Daily   Chlorhexidine Gluconate Cloth  6 each Topical Daily   clopidogrel  75 mg Oral Daily   docusate sodium  200 mg Oral Daily   donepezil  5 mg Oral Daily   DULoxetine  60 mg Oral Daily   enoxaparin (LOVENOX) injection  40 mg Subcutaneous QHS   insulin aspart  0-15 Units Subcutaneous Q4H   insulin detemir  10 Units Subcutaneous BID   levothyroxine  112 mcg Oral Daily   lidocaine  1 patch Transdermal Q24H   methocarbamol  500 mg Oral TID   mouth rinse  15 mL Mouth Rinse 4 times per day   pantoprazole  40 mg Oral Daily   Continuous Infusions:  albumin human 12.5 g (04/20/23 1700)   insulin Stopped (04/20/23 0955)   norepinephrine (LEVOPHED) Adult infusion 8 mcg/min (04/21/23 0600)   PRN Meds:.albumin human, dextrose, metoprolol tartrate, morphine injection, ondansetron (ZOFRAN) IV, oxyCODONE, traMADol  General appearance:  Neurologic: not as alert and only follows commands intermittently, moves all extremities.  Heart: RRR, monitor showing NSR, no significant arrhythmias Lungs: breath sounds clear but shallow, CXR with no unexpected changes. CT drainage minimal.  Abdomen: no obvious tenderness Extremities: all warm and well perfused. Minimal output from the RLE JP drain  Wound: The sternotomy is covered with a Prevena negative pressure dressing, minor oozing from the RLE EVH incision.  Lab Results: CBC: Recent Labs    04/20/23 1515 04/20/23 1835 04/21/23 0436 04/21/23 0439  WBC 22.6*  --   --  16.9*  HGB 9.3*  9.9*   < > 9.5* 8.7*  HCT 30.9*  29.0*   < > 28.0* 28.4*  PLT 218  --   --  175   < > = values in this interval not displayed.   BMET:  Recent Labs    04/20/23 1515 04/20/23 1835 04/21/23 0436 04/21/23 0439  NA 138  138   < > 138 137  K 4.7  4.9   < > 4.6 4.8  CL 103  --   --  106  CO2 21*  --   --  22  GLUCOSE 233*  --   --  231*  BUN 23  --   --  30*  CREATININE 2.25*  --   --  2.59*  CALCIUM 8.1*  --    --  8.7*   < > = values in this interval not displayed.    CMET: Lab Results  Component Value Date   WBC 16.9 (H) 04/21/2023   HGB 8.7 (L) 04/21/2023   HCT 28.4 (L) 04/21/2023   PLT 175 04/21/2023   GLUCOSE 231 (H) 04/21/2023   CHOL 215 (H) 04/17/2023   TRIG 204 (H) 04/08/2023   HDL 47 04/01/2023   LDLCALC 127 (H) 04/06/2023   ALT 21 04/01/2023   AST 24 03/23/2023   NA 137 04/21/2023   K 4.8 04/21/2023   CL 106 04/21/2023   CREATININE 2.59 (H) 04/21/2023   BUN 30 (H) 04/21/2023   CO2 22 04/21/2023   TSH 7.571 (H) 04/15/2023   INR 1.4 (H) 04/05/2023   HGBA1C 6.8 (H) 04/15/2023      PT/INR:  Recent Labs    04/19/23 1311  LABPROT 17.7*  INR 1.4*   Radiology: [facility] Chest Port 1 View  Result Date: 04/21/2023 CLINICAL DATA:  72 year old female status post CABG postoperative day 2. EXAM: PORTABLE CHEST 1 VIEW COMPARISON:  04/20/2023 portable chest and earlier. FINDINGS: Portable AP semi upright view at 0551 hours. Stable right IJ central catheter. Left lung base and mediastinal tubes remain in place. Stable low lung volumes. Stable cardiac size and mediastinal contours. No pneumothorax or pulmonary edema. No consolidation. Small if any pleural effusions. Paucity of bowel gas in the visible abdomen. Stable visualized osseous structures. IMPRESSION: 1.  Stable lines and tubes. 2. No pneumothorax or pulmonary edema. Small if any pleural effusions. Electronically Signed   By: Odessa Fleming M.D.   On: 04/21/2023 08:15     Assessment/Plan: S/P Procedure(s) (LRB): CORONARY ARTERY BYPASS GRAFTING (CABG) X2 USING LEFT INTERNAL MAMMARY ARTERY AND ENDOSCOPICALLY HARVESTED RIGHT GREATER SAPHENOUS VEIN (N/A) TRANSESOPHAGEAL ECHOCARDIOGRAM (N/A)  -POD2 CABG x 2 for left main and 2vCAD presenting with UAP and mildly reduced LVF.  Became hypercarbic, hypotensive and anuric yesterday afternoon and was started on norepi.   MAP 70's-80's.  CI~2-2.4 overnight. H/O statin intolerance. Currently on ASA,  low dose statin.  Metoprolol on hold due to hypotension.  Consider adding Midodrine, wean norep.  Plan to d/c chest drains and RLE JP today.  Mobilize as able.    -PULM- no h/o tobacco use. BiPAP restarted yesterday afternoon and continued overnight with stable sats. Now on [place] O2 with normal WOB.  CXR stable. Work on pulmonary  hygiene as able.   -HEME- mild expected ABL anemia and thrombocytopenia. Monitoring.  Leukocytosis without fever. WBC trending down.   -ENDO- type 2 DM. Glucose control fair on Levemir and SSI.  Add Novalog when eating.    -GI- Abd benign, clear liquids as tolerated.  -RENAL-Acute on chronic renal insufficiency. H/O left nephrectomy for pelvic kidney and stage 3b CKD.  Creat ~1-4--1.6 at baseline and now 2.6.  Anuric past several hours. Agree with nephrology consult.  -NEURO- metabolic encephalopathy. Suspect multiple factors involved including hypercarbia, uremia (mild), medications.  Would hold methocarbamol and minimize narcotic use.     Leary Roca, PA-C 04/21/2023 8:47 AM  Agree with above Will give albumin Titrating levo for SBP > 100 Will remove chest tubes Swallow study Needs more PT/OT  Gwyndolyn Guilford Keane Scrape

## 2023-04-21 NOTE — Plan of Care (Signed)
  Problem: Skin Integrity: Goal: Wound healing without signs and symptoms of infection Outcome: Progressing Goal: Risk for impaired skin integrity will decrease Outcome: Progressing   Problem: Urinary Elimination: Goal: Ability to achieve and maintain adequate renal perfusion and functioning will improve Outcome: Progressing   Problem: Education: Goal: Will demonstrate proper wound care and an understanding of methods to prevent future damage Outcome: Progressing Goal: Knowledge of disease or condition will improve Outcome: Progressing Goal: Knowledge of the prescribed therapeutic regimen will improve Outcome: Progressing Goal: Individualized Educational Video(s) Outcome: Progressing   Problem: Activity: Goal: Risk for activity intolerance will decrease Outcome: Progressing   Problem: Cardiac: Goal: Will achieve and/or maintain hemodynamic stability Outcome: Progressing   Problem: Clinical Measurements: Goal: Postoperative complications will be avoided or minimized Outcome: Progressing   Problem: Skin Integrity: Goal: Wound healing without signs and symptoms of infection Outcome: Progressing

## 2023-04-21 NOTE — Progress Notes (Signed)
At 1945 this nurse responded to a desaturation alarm. Upon entering the room, the patient's nasal cannula was off, the dressing on her sternal incision was disturbed and had lost negative pressure, and her central line was dislodged. After replacing patient's nasal cannula and oxygen saturation recovered, educated patient on danger of pulling tubes, lines, and dressing after surgery. Patient was non-accepting of education. Safety mitts applied, and central line removed, dressing placed. Negative pressure dressing reinforced and therapy restarted. Fall mats in place, call bell within reach.  Elliot Cousin, RN

## 2023-04-21 NOTE — Progress Notes (Signed)
NAME:  Debra Jordan, MRN:  [id number], DOB:  [birth year], LOS: 3 ADMISSION DATE:  04/22/2023, CONSULTATION DATE: 04/06/2023 REFERRING MD: Dr. 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 and morbid obesity who was admitted with acute NSTEMI, underwent left heart cath which showed multivessel coronary artery disease, echocardiogram showed normal EF with diastolic dysfunction, today patient underwent CABG x 2, remained intubated and transferred to ICU, PCCM was consulted for help evaluation medical management  Pertinent  Medical History    has a past medical history of ADHD (attention deficit hyperactivity disorder), Arthritis, Bilateral shoulder pain (05/13/2022), Blue toe syndrome (10/06/2011), Broken shoulder, Bursitis, Cataract, Chronic fatigue, unspecified (03/03/2019), Chronic kidney disease, Diabetic retinopathy, Dyspnea, Elevated troponin (09/17/2021), Epigastric pain (09/17/2021), Family history of Alzheimer's disease (10/12/2022), Fatty liver, Fibromyalgia, Gallstones without obstruction of gallbladder (10/04/2021), Gout (09/06/2011), History of COVID-19 (03/03/2019), History of headache, History of kidney stones, History of nephrectomy, unilateral (09/06/2011), History of PCOS, History of skin cancer, History of venous thrombosis and embolism, HTN (hypertension) (09/06/2011), Hypercalcemia (09/17/2021), Hyperlipidemia, Hypersomnolence, Hypertensive retinopathy, Hypertriglyceridemia (09/06/2011), Hypothyroidism (09/06/2011), Ischemia of extremity (09/06/2011), Lacunar infarction, Mass of finger of right hand (03/11/2019), Migraine headache (09/06/2011), Neuropathy, Obesity, Osteoarthritis, hand (08/06/2020), Pain in right hip (08/22/1972), Pain in right shoulder (08/06/2020), Pain in toe of right foot (09/05/2011), Pancreatic cyst (10/04/2021), Pneumonia,  Seizure disorder, Somatic symptom disorder (11/07/2022), Subjective memory complaints (11/07/2022), Trouble swallowing, and Type II diabetes mellitus (09/06/2011).  Significant Hospital Events: Including procedures, antibiotic start and stop dates in addition to other pertinent events     Interim History / Subjective:  Waxing and waning periods of somnolence yesterday. Became hypercarbic, went back on BiPAP.  Wore BiPAP all night.  UOP poor Remains on levophed Chest tube output minimal.  C/o severe chest pain r/t surgery No fever  Objective   Blood pressure 133/73, pulse 86, temperature 98 F (36.7 C), temperature source Oral, resp. rate 17, height 5' 4.25" (1.632 m), weight 114.3 kg, SpO2 98%. CVP:  [5 mmHg-35 mmHg] 13 mmHg CO:  [3.6 L/min-5.9 L/min] 4.4 L/min CI:  [1.7 L/min/m2-2.8 L/min/m2] 2.1 L/min/m2  Vent Mode: BIPAP FiO2 (%):  [40 %] 40 % Set Rate:  [12 bmp] 12 bmp PEEP:  [5 cmH20] 5 cmH20 Pressure Support:  [10 cmH20] 10 cmH20   Intake/Output Summary (Last 24 hours) at 04/21/2023 0732 Last data filed at 04/21/2023 0600 Gross per 24 hour  Intake 2436.07 ml  Output 256 ml  Net 2180.07 ml   Filed Weights   04/22/2023 0436 04/20/23 0410 04/21/23 0500  Weight: 109.6 kg 114.2 kg 114.3 kg    Examination: General: obese elderly female in mild distress d/t pain HEENT: [place]/AT, PERRL, no JVD Neuro: Drowsy but cooperates with exam. Mild confusion.  Chest: Central sternotomy wound looks clean and dry, coarse breath sounds, no wheezes or rhonchi.  Diminished bases. Sats adeqate on 4L . Mediastinal chest tube in place Heart: Regular rate and rhythm, no murmurs or gallops Abdomen: Soft, nontender, nondistended, bowel sounds present Skin: No rash Extremities: Drain in place on right lower extremity with bloody drainage  Labs and images were reviewed  Resolved Hospital Problem list     Assessment & Plan:  Coronary artery disease s/p CABG x 2 ASA and plavix once taking  PO, may need cortrak today Chest tube management TCTS - output minimal Continue pain control with tramadol, oxycodone and  morphine. Lidocaine patches  Acute respiratory insufficiency, postop:  Extubated on protocol Has required BiPAP off and on for hypercarbia Currently on 4 L nasal cannula oxygen Encourage incentive spirometry - deep breathing limited by pain.  Needs better pain management as above She will need sleep study as an outpatient for possible OSA  Shock: suspect cardiogenic or less likely hypovolemic etiology. Did improve some with volume yesterday, but still on pressors. No fever and leukocytosis is improving so doubt sepsis.  NE for MAP goal 65 Holding further volume  Chronic HFpEF - EF 50% with grade 2 diastolic dysfunction Monitor intake and output GDMT once able to tolerate  Acute metabolic encephalopathy: so far seems to be driven by hypercarbia.  Send ammonia, TSH  HTN Hyperlipidemia Continue atorvastatin Holding BB  Diabetes type 2 Patient hemoglobin A1c is 6.8 CBG monitoring and SSI  Expected perioperative blood loss anemia Monitor H/H  AKI CKD stage IIIb status post left nephrectomy of pelvic kidney Serum creatinine is at baseline between 1.2-1.3 Creatinine worsening Essentially anuric yesterday Will need to attempt diuretic today despite hypotension  Nutrition - SLP consult - May need cortrak for TF - NPO for now  Hypothyroid: TSH elevated earlier in admission Check t4 Continue synthroid   Best Practice (right click and "Reselect all SmartList Selections" daily)   Diet/type: Regular consistency DVT prophylaxis: Enoxaparin GI prophylaxis: PPI Lines: Central line, Arterial Line, and yes and it is still needed Foley:  Yes, and it is still needed Code Status:  full code Last date of multidisciplinary goals of care discussion [Per primary team]   Labs   CBC: Recent Labs  Lab 04/09/2023 1318 03/28/2023 0302 04/17/2023 1311  03/28/2023 1620 04/20/23 0336 04/20/23 0844 04/20/23 1315 04/20/23 1515 04/20/23 1835 04/20/23 2239 04/21/23 0436 04/21/23 0439  WBC 7.7   < > 21.4*  --  17.1* 18.4*  --  22.6*  --   --   --  16.9*  NEUTROABS 5.1  --   --   --   --   --   --   --   --   --   --   --   HGB 12.9   < > 8.8*   < > 9.1* 9.2*   < > 9.3*  9.9* 8.5* 8.5* 9.5* 8.7*  HCT 41.1   < > 28.7*   < > 29.3* 29.6*   < > 30.9*  29.0* 25.0* 25.0* 28.0* 28.4*  MCV 86.9   < > 86.7  --  86.2 87.1  --  90.1  --   --   --  90.4  PLT 216   < > 155  --  146* 183  --  218  --   --   --  175   < > = values in this interval not displayed.    Basic Metabolic Panel: Recent Labs  Lab 04/12/2023 1318 04/21/2023 0809 04/10/2023 1140 04/20/2023 1143 03/30/2023 1821 04/20/23 0336 04/20/23 0844 04/20/23 1315 04/20/23 1515 04/20/23 1835 04/20/23 2239 04/21/23 0436 04/21/23 0439  NA 140   < > 138   < > 141 140  --    < > 138  138 138 139 138 137  K 4.5   < > 5.5*   < > 4.1 4.1  --    < > 4.7  4.9 4.5 4.4 4.6 4.8  CL 102   < > 105  --  108 105  --   --  103  --   --   --  106  CO2 26  --   --   --  22 24  --   --  21*  --   --   --  22  GLUCOSE 208*   < > 138*  --  137* 144*  --   --  233*  --   --   --  231*  BUN 18   < > 17  --  12 15  --   --  23  --   --   --  30*  CREATININE 1.48*   < > 1.20*  --  1.19* 1.32* 1.81*  --  2.25*  --   --   --  2.59*  CALCIUM 9.4  --   --   --  7.5* 8.0*  --   --  8.1*  --   --   --  8.7*  MG  --   --   --   --  3.0* 2.6*  --   --  2.7*  --   --   --   --   PHOS  --   --   --   --   --   --   --   --  4.8*  --   --   --   --    < > = values in this interval not displayed.   GFR: Estimated Creatinine Clearance: 25.5 mL/min (A) (by C-G formula based on SCr of 2.59 mg/dL (H)). Recent Labs  Lab 04/20/23 0336 04/20/23 0844 04/20/23 1515 04/20/23 1519 04/20/23 1840 04/21/23 0439  WBC 17.1* 18.4* 22.6*  --   --  16.9*  LATICACIDVEN  --   --   --  2.6* 1.3  --     Liver Function Tests: Recent  Labs  Lab 04/20/2023 1318  AST 24  ALT 21  ALKPHOS 57  BILITOT 0.7  PROT 6.7  ALBUMIN 3.7   Recent Labs  Lab 04/12/2023 1318  LIPASE 30   No results for input(s): "AMMONIA" in the last 168 hours.  ABG    Component Value Date/Time   PHART 7.262 (L) 04/21/2023 0436   PCO2ART 51.5 (H) 04/21/2023 0436   PO2ART 90 04/21/2023 0436   HCO3 23.2 04/21/2023 0436   TCO2 25 04/21/2023 0436   ACIDBASEDEF 4.0 (H) 04/21/2023 0436   O2SAT 95 04/21/2023 0436     Coagulation Profile: Recent Labs  Lab 03/31/2023 1311  INR 1.4*    Cardiac Enzymes: No results for input(s): "CKTOTAL", "CKMB", "CKMBINDEX", "TROPONINI" in the last 168 hours.  HbA1C: Hgb A1c MFr Bld  Date/Time Value Ref Range Status  04/11/2023 05:50 PM 6.8 (H) 4.8 - 5.6 % Final    Comment:    (NOTE) Pre diabetes:          5.7%-6.4%  Diabetes:              >6.4%  Glycemic control for   <7.0% adults with diabetes   10/13/2021 01:06 PM 7.7 (H) 4.8 - 5.6 % Final    Comment:    (NOTE)         Prediabetes: 5.7 - 6.4         Diabetes: >6.4         Glycemic control for adults with diabetes: <7.0     CBG: Recent Labs  Lab 04/20/23 1117 04/20/23 1638 04/20/23 2021 04/21/23 0022 04/21/23 0433  GLUCAP 152* 218* 167* 170* 206*    Critical care time 43 minutes  Joneen Roach, AGACNP-BC [facility]  See Amion for personal pager PCCM on call pager [phone] until 7pm. Please call Elink 7p-7a. [phone]  04/21/2023 7:50 AM

## 2023-04-21 NOTE — Progress Notes (Signed)
At 2100, pt refused oral care, refused 2200 robaxin dose, and when NT and RN attempted to replace BiPAP, pt repeatedly removed mask. RN placed HFNC as alternative and educated pt on necessity of oxygen therapy at this time.

## 2023-04-21 NOTE — Evaluation (Signed)
Physical Therapy Evaluation Patient Details Name: Debra Jordan MRN: [id number] DOB: 1950-10-23 Today's Date: 04/21/2023  History of Present Illness  72 year old female admitted 03/31/2023 with acute NSTEMI and underwent CABG x 2 on 04/12/2023.  PMH positive for diabetes, hypertension, CKD stage IIIb status post left nephrectomy due to pelvic kidney, gout, recurrent toe ischemia, prior CVA, seizure disorder, neuropathy, retinopathy, and morbid obesity.  Clinical Impression  Patient presents with decreased mobility due to weakness, imbalance, decreased safety awareness, decreased knowledge of precautions and decreased cardiorespiratory endurance.  Patient previously able to care for her daughter (who is also hospitalized) and driving and independent.  Currently with +2 A for mobility for safety due to impulsivity, multiple lines and decreased safety.  She will benefit from skilled PT in the acute setting and from follow up inpatient rehab <3 hours/day at d/c.         If plan is discharge home, recommend the following: Two people to help with walking and/or transfers;Assistance with cooking/housework;Assist for transportation;Supervision due to cognitive status;Two people to help with bathing/dressing/bathroom;Assistance with feeding;Help with stairs or ramp for entrance   Can travel by private vehicle   No    Equipment Recommendations Other (comment) (TBA)  Recommendations for Other Services       Functional Status Assessment Patient has had a recent decline in their functional status and demonstrates the ability to make significant improvements in function in a reasonable and predictable amount of time.     Precautions / Restrictions Precautions Precautions: Fall;Sternal Precaution Comments: Chest Tube, JP Drain to R lower leg, wound vac on sternal wound      Mobility  Bed Mobility Overal bed mobility: Needs Assistance Bed Mobility: Sit to Supine       Sit to supine: +2 for  safety/equipment, Max assist   General bed mobility comments: RN in to assist for shoulders and PT lifted legs into bed    Transfers Overall transfer level: Needs assistance Equipment used: Rolling walker (2 wheels) Transfers: Sit to/from Stand, Bed to chair/wheelchair/BSC Sit to Stand: Min assist   Step pivot transfers: Mod assist, +2 safety/equipment       General transfer comment: patient up to [facility] with RW and A for lines and safety and balance with cues; to bed from [facility] with increased time, assist for lines and finally bringing bed closer to her for safety    Ambulation/Gait               General Gait Details: unable today  Stairs            Wheelchair Mobility     Tilt Bed    Modified Rankin (Stroke Patients Only)       Balance Overall balance assessment: Needs assistance Sitting-balance support: Feet supported Sitting balance-Leahy Scale: Poor Sitting balance - Comments: collapsing onto elbows onwalker in front of her when on BSC despite cues not to weight bear on her arms   Standing balance support: Bilateral upper extremity supported, Reliant on assistive device for balance Standing balance-Leahy Scale: Poor                               Pertinent Vitals/Pain Pain Assessment Pain Assessment: Faces Faces Pain Scale: Hurts little more Pain Location: generalized with mobility Pain Descriptors / Indicators: Guarding, Discomfort Pain Intervention(s): Monitored during session, Repositioned    Home Living Family/patient expects to be discharged to:: Private residence Living Arrangements: Children Available Help  at Discharge: Family Type of Home: House Home Access: Stairs to enter   [employer] of Steps: ? 2 with a handrail   Home Layout: One level Home Equipment: Cane - single point      Prior Function Prior Level of Function : Independent/Modified Independent;Driving             Mobility Comments: Walks with  a SPC ADLs Comments: Cares for her daughter with special needs who is also in the hospital.  Continues to drive.     Extremity/Trunk Assessment   Upper Extremity Assessment Upper Extremity Assessment: Defer to OT evaluation    Lower Extremity Assessment Lower Extremity Assessment: RLE deficits/detail RLE Deficits / Details: limited ankle DF with JP drain from lower leg    Cervical / Trunk Assessment Cervical / Trunk Assessment: Other exceptions Cervical / Trunk Exceptions: keeping head/neck to R despite cues, stretching/positioning  Communication   Communication Communication: Difficulty following commands/understanding  Cognition Arousal: Lethargic Behavior During Therapy: Impulsive Overall Cognitive Status: Impaired/Different from baseline Area of Impairment: Following commands, Attention, Safety/judgement, Problem solving                   Current Attention Level: Focused   Following Commands: Follows one step commands with increased time, Follows one step commands inconsistently Safety/Judgement: Decreased awareness of safety, Decreased awareness of deficits   Problem Solving: Slow processing, Decreased initiation, Difficulty sequencing General Comments: not answering questions about home set up; needs constant redirection to prevent pushing with her arms; perseverates on asking why we will not let her go pee when she is on the [facility]        General Comments General comments (skin integrity, edema, etc.): RN in the room throughout, did have some urine in [facility] though RN reports no urination yesterday; on O2 4L HFNC with SpO2 WNL,    Exercises     Assessment/Plan    PT Assessment Patient needs continued PT services  PT Problem List Decreased strength;Decreased balance;Decreased cognition;Pain;Decreased knowledge of use of DME;Decreased mobility;Decreased activity tolerance;Cardiopulmonary status limiting activity       PT Treatment Interventions DME  instruction;Functional mobility training;Balance training;Patient/family education;Gait training;Therapeutic activities;Therapeutic exercise;Cognitive remediation    PT Goals (Current goals can be found in the Care Plan section)  Acute Rehab PT Goals Patient Stated Goal: unable to state PT Goal Formulation: Patient unable to participate in goal setting Time For Goal Achievement: 05/05/23 Potential to Achieve Goals: Good    Frequency Min 1X/week     Co-evaluation               AM-PAC PT "6 Clicks" Mobility  Outcome Measure Help needed turning from your back to your side while in a flat bed without using bedrails?: Total Help needed moving from lying on your back to sitting on the side of a flat bed without using bedrails?: Total Help needed moving to and from a bed to a chair (including a wheelchair)?: A Lot Help needed standing up from a chair using your arms (e.g., wheelchair or bedside chair)?: A Lot Help needed to walk in hospital room?: Total Help needed climbing 3-5 steps with a railing? : Total 6 Click Score: 8    End of Session Equipment Utilized During Treatment: Gait belt;Oxygen;Other (comment) (chest tube, wound vac)   Patient left: in bed;with call bell/phone within reach;with nursing/sitter in room   PT Visit Diagnosis: Muscle weakness (generalized) (M62.81);Other abnormalities of gait and mobility (R26.89);Other symptoms and signs involving the nervous system (R29.898)  Time: 1350-1420 PT Time Calculation (min) (ACUTE ONLY): 30 min   Charges:   PT Evaluation $PT Eval Moderate Complexity: 1 Mod PT Treatments $Therapeutic Activity: 8-22 mins PT General Charges $$ ACUTE PT VISIT: 1 Visit         Sheran Lawless, PT Acute Rehabilitation Services [phone] 04/21/2023   Elray Mcgregor 04/21/2023, 6:17 PM

## 2023-04-21 NOTE — Progress Notes (Signed)
At approximately 2130, attempted to administer 2200 and PRN PO medications. Pt had passed a Yale swallow screen earlier today. I removed her BiPAP and performed oral care. Out of an abundance of caution, I asked pt to sip some water through a straw, and patient was unable to, repeatedly bit straw despite coaching, and closed eyes. Will keep pt NPO at this time due to confusion/somnolence.  Elliot Cousin, RN

## 2023-04-22 ENCOUNTER — Inpatient Hospital Stay (HOSPITAL_COMMUNITY): Payer: Medicare Other

## 2023-04-22 DIAGNOSIS — R578 Other shock: Secondary | ICD-10-CM

## 2023-04-22 DIAGNOSIS — I739 Peripheral vascular disease, unspecified: Secondary | ICD-10-CM

## 2023-04-22 DIAGNOSIS — E039 Hypothyroidism, unspecified: Secondary | ICD-10-CM

## 2023-04-22 DIAGNOSIS — I10 Essential (primary) hypertension: Secondary | ICD-10-CM

## 2023-04-22 DIAGNOSIS — I200 Unstable angina: Secondary | ICD-10-CM | POA: Diagnosis not present

## 2023-04-22 DIAGNOSIS — I2510 Atherosclerotic heart disease of native coronary artery without angina pectoris: Secondary | ICD-10-CM

## 2023-04-22 LAB — BASIC METABOLIC PANEL
Anion gap: 10 (ref 5–15)
BUN: 34 mg/dL — ABNORMAL HIGH (ref 8–23)
CO2: 23 mmol/L (ref 22–32)
Calcium: 8.8 mg/dL — ABNORMAL LOW (ref 8.9–10.3)
Chloride: 104 mmol/L (ref 98–111)
Creatinine, Ser: 2.3 mg/dL — ABNORMAL HIGH (ref 0.44–1.00)
GFR, Estimated: 22 mL/min — ABNORMAL LOW (ref >60)
Glucose, Bld: 172 mg/dL — ABNORMAL HIGH (ref 70–99)
Potassium: 4.4 mmol/L (ref 3.5–5.1)
Sodium: 137 mmol/L (ref 135–145)

## 2023-04-22 LAB — ECHOCARDIOGRAM LIMITED
AV Mean grad: 4 mmHg
AV Peak grad: 8.2 mmHg
Ao pk vel: 1.43 m/s
Height: 64.25 in
Weight: 4134.07 [oz_av]

## 2023-04-22 LAB — GLUCOSE, CAPILLARY
Glucose-Capillary: 156 mg/dL — ABNORMAL HIGH (ref 70–99)
Glucose-Capillary: 157 mg/dL — ABNORMAL HIGH (ref 70–99)
Glucose-Capillary: 161 mg/dL — ABNORMAL HIGH (ref 70–99)
Glucose-Capillary: 162 mg/dL — ABNORMAL HIGH (ref 70–99)
Glucose-Capillary: 165 mg/dL — ABNORMAL HIGH (ref 70–99)
Glucose-Capillary: 166 mg/dL — ABNORMAL HIGH (ref 70–99)
Glucose-Capillary: 188 mg/dL — ABNORMAL HIGH (ref 70–99)

## 2023-04-22 LAB — CBC
HCT: 26 % — ABNORMAL LOW (ref 36.0–46.0)
Hemoglobin: 8 g/dL — ABNORMAL LOW (ref 12.0–15.0)
MCH: 27.7 pg (ref 26.0–34.0)
MCHC: 30.8 g/dL (ref 30.0–36.0)
MCV: 90 fL (ref 80.0–100.0)
Platelets: 143 10*3/uL — ABNORMAL LOW (ref 150–400)
RBC: 2.89 MIL/uL — ABNORMAL LOW (ref 3.87–5.11)
RDW: 16.1 % — ABNORMAL HIGH (ref 11.5–15.5)
WBC: 13 10*3/uL — ABNORMAL HIGH (ref 4.0–10.5)
nRBC: 0.3 % — ABNORMAL HIGH (ref 0.0–0.2)

## 2023-04-22 LAB — MAGNESIUM: Magnesium: 2.5 mg/dL — ABNORMAL HIGH (ref 1.7–2.4)

## 2023-04-22 LAB — PHOSPHORUS: Phosphorus: 2.7 mg/dL (ref 2.5–4.6)

## 2023-04-22 MED ORDER — DEXMEDETOMIDINE HCL IN NACL 400 MCG/100ML IV SOLN
0.0000 ug/kg/h | INTRAVENOUS | Status: DC
  Administered 2023-04-22: 0.4 ug/kg/h via INTRAVENOUS
  Filled 2023-04-22 (×2): qty 100

## 2023-04-22 MED ORDER — DEXTROSE-SODIUM CHLORIDE 5-0.9 % IV SOLN: INTRAVENOUS | Status: DC

## 2023-04-22 MED ORDER — PERFLUTREN LIPID MICROSPHERE
1.0000 mL | INTRAVENOUS | Status: AC | PRN
  Administered 2023-04-22: 2 mL via INTRAVENOUS
  Filled 2023-04-22: qty 10

## 2023-04-22 MED ORDER — ENOXAPARIN SODIUM 30 MG/0.3ML IJ SOSY
30.0000 mg | PREFILLED_SYRINGE | Freq: Every day | INTRAMUSCULAR | Status: DC
  Administered 2023-04-22: 30 mg via SUBCUTANEOUS
  Filled 2023-04-22: qty 0.3

## 2023-04-22 MED ORDER — QUETIAPINE FUMARATE 25 MG PO TABS: 25.0000 mg | ORAL_TABLET | Freq: Every day | ORAL | Status: DC

## 2023-04-22 NOTE — Progress Notes (Signed)
0700 Patient assessed.  Continuously making humming noise.  Will not respond as to why.  When orientation questions are asked she refuses to answer other than [place].   0800 Patient continuously removing oxygen and pulling/picking at lines and dressings.  Patient on Room air.  O2 sat 95.  Refuses to answer questions of if she is in pain or what her goals for the day are.  Refuses oral care.  Refuses medications stating she does not need them.  Foley and peri care reluctantly performed on patient.  Tolerated poorly.   1000 Patient self removed negative pressure wound therapy to sternal dressing.  Dr. Laneta Simmers asked to come to bedside.  Ok to leave open to air and paint with betadine BID per Dr. Laneta Simmers. 1045 Dr. Katrinka Blazing contacted for patient yelling in room.  Attempting to remove IV and gown.  Continuously removing oxygen and de-sat to 80's. Order for precedex received. 1500 Patient encouraged to eat and drink and take medications per RN.  Patient continues to refuse.  Refuses to get out of bed.  States she does not medication and is not hungry.  Educated on need for nutrition and medication adherence to increase chance of positive patient outcomes.  Patient states she does not need it and does not care. 1730 Dr. Laneta Simmers rounding again on patient.  Wants precedex stopped.  Would like to allow patient to be awake if possible.  Patient states she is in pain but will not state where.  Continues to refuse medications including pain medication.

## 2023-04-22 NOTE — Progress Notes (Signed)
3 Days Post-Op Procedure(s) (LRB): CORONARY ARTERY BYPASS GRAFTING (CABG) X2 USING LEFT INTERNAL MAMMARY ARTERY AND ENDOSCOPICALLY HARVESTED RIGHT GREATER SAPHENOUS VEIN (N/A) TRANSESOPHAGEAL ECHOCARDIOGRAM (N/A) Subjective:  Says she feels terrible but non-specific. She has been refusing to eat, refusing medications, refusing treatments.  Objective: Vital signs in last 24 hours: Temp:  [98.1 F (36.7 C)-99.6 F (37.6 C)] 98.8 F (37.1 C) (08/31 0821) Pulse Rate:  [68-101] 89 (08/31 0800) Cardiac Rhythm: Normal sinus rhythm (08/30 2000) Resp:  [15-27] 20 (08/31 0800) BP: (93-135)/(48-83) 126/71 (08/31 0800) SpO2:  [85 %-100 %] 93 % (08/31 0800) FiO2 (%):  [40 %] 40 % (08/30 2018) Weight:  [117.2 kg] 117.2 kg (08/31 0500)  Hemodynamic parameters for last 24 hours:    Intake/Output from previous day: 08/30 0701 - 08/31 0700 In: 328.3 [I.V.:204.8; IV Piggyback:123.6] Out: 1000 [Urine:870; Drains:20; Chest Tube:110] Intake/Output this shift: No intake/output data recorded.  General appearance: slowed mentation and uncooperative Neurologic: intact Heart: regular rate and rhythm Lungs: clear to auscultation bilaterally Abdomen: soft, non-tender; bowel sounds normal Extremities: edema mild Wound: incision looks ok. She pulled Prevena off.  Lab Results: Recent Labs    04/21/23 1037 04/22/23 0221  WBC 15.3* 13.0*  HGB 8.4* 8.0*  HCT 28.1* 26.0*  PLT 160 143*   BMET:  Recent Labs    04/21/23 0439 04/21/23 0853 04/22/23 0221  NA 137 134* 137  K 4.8 4.4 4.4  CL 106  --  104  CO2 22  --  23  GLUCOSE 231*  --  172*  BUN 30*  --  34*  CREATININE 2.59*  --  2.30*  CALCIUM 8.7*  --  8.8*    PT/INR:  Recent Labs    04/11/2023 1311  LABPROT 17.7*  INR 1.4*   ABG    Component Value Date/Time   PHART 7.334 (L) 04/21/2023 0853   HCO3 21.3 04/21/2023 0853   TCO2 22 04/21/2023 0853   ACIDBASEDEF 4.0 (H) 04/21/2023 0853   O2SAT 89.2 04/21/2023 0918   CBG (last 3)   Recent Labs    04/22/23 0016 04/22/23 0355 04/22/23 0818  GLUCAP 166* 161* 162*    Assessment/Plan: S/P Procedure(s) (LRB): CORONARY ARTERY BYPASS GRAFTING (CABG) X2 USING LEFT INTERNAL MAMMARY ARTERY AND ENDOSCOPICALLY HARVESTED RIGHT GREATER SAPHENOUS VEIN (N/A) TRANSESOPHAGEAL ECHOCARDIOGRAM (N/A)  POD 3 Hemodynamically stable on midodrine 10 tid. Maintaining sinus rhythm.  Postop delirium. I don't know what her preop mental status was like. She was on Cymbalta at home which she is back on but refusing to take. TSH elevated but free T4 normal. Ammonia level normal.   Volume excess: Wt is 17 lbs over preop if accurate but it is a bed weight so I doubt it.  Stage 3b CKD s/p left nephrectomy for pelvic kidney. Creat trending back down. May need to start some IVF if she refuses to eat. May need feeding tube.   LOS: 4 days    Alleen Borne 04/22/2023

## 2023-04-22 NOTE — Plan of Care (Signed)
  Problem: Education: Goal: Understanding of CV disease, CV risk reduction, and recovery process will improve Outcome: Progressing   Problem: Health Behavior/Discharge Planning: Goal: Ability to safely manage health-related needs after discharge will improve Outcome: Progressing   Problem: Education: Goal: Understanding of cardiac disease, CV risk reduction, and recovery process will improve Outcome: Progressing Goal: Individualized Educational Video(s) Outcome: Progressing

## 2023-04-22 NOTE — Progress Notes (Signed)
   04/22/23 2023  BiPAP/CPAP/SIPAP  BiPAP/CPAP/SIPAP Pt Type Adult  BiPAP/CPAP/SIPAP SERVO  Reason BIPAP/CPAP not in use Non-compliant (pt refused)   RT attempted to place BiPAP 2x pt refused.

## 2023-04-22 NOTE — Progress Notes (Signed)
Patient ID: Debra Jordan, female   DOB: 29-Apr-1951, 72 y.o.   MRN: [id number]  TCTS Evening Rounds:  Hemodynamically stable in sinus rhythm.  Delirium persists. She just moans. Moving all ext. Answers brief questions.   UO ok. Getting IVF at 50/hr.  Sats 98%.

## 2023-04-22 NOTE — Progress Notes (Addendum)
NAME:  Debra Jordan, MRN:  [id number], DOB:  25-Jan-1951, LOS: 4 ADMISSION DATE:  04/04/2023, CONSULTATION DATE: 04/05/2023 REFERRING MD: Dr. 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 and morbid obesity who was admitted with acute NSTEMI, underwent left heart cath which showed multivessel coronary artery disease, echocardiogram showed normal EF with diastolic dysfunction, today patient underwent CABG x 2, remained intubated and transferred to ICU, PCCM was consulted for help evaluation medical management  Pertinent  Medical History    has a past medical history of ADHD (attention deficit hyperactivity disorder), Arthritis, Bilateral shoulder pain (05/13/2022), Blue toe syndrome (10/06/2011), Broken shoulder, Bursitis, Cataract, Chronic fatigue, unspecified (03/03/2019), Chronic kidney disease, Diabetic retinopathy, Dyspnea, Elevated troponin (09/17/2021), Epigastric pain (09/17/2021), Family history of Alzheimer's disease (10/12/2022), Fatty liver, Fibromyalgia, Gallstones without obstruction of gallbladder (10/04/2021), Gout (09/06/2011), History of COVID-19 (03/03/2019), History of headache, History of kidney stones, History of nephrectomy, unilateral (09/06/2011), History of PCOS, History of skin cancer, History of venous thrombosis and embolism, HTN (hypertension) (09/06/2011), Hypercalcemia (09/17/2021), Hyperlipidemia, Hypersomnolence, Hypertensive retinopathy, Hypertriglyceridemia (09/06/2011), Hypothyroidism (09/06/2011), Ischemia of extremity (09/06/2011), Lacunar infarction, Mass of finger of right hand (03/11/2019), Migraine headache (09/06/2011), Neuropathy, Obesity, Osteoarthritis, hand (08/06/2020), Pain in right hip (08/22/1972), Pain in right shoulder (08/06/2020), Pain in toe of right foot (09/05/2011), Pancreatic cyst (10/04/2021), Pneumonia,  Seizure disorder, Somatic symptom disorder (11/07/2022), Subjective memory complaints (11/07/2022), Trouble swallowing, and Type II diabetes mellitus (09/06/2011).  Significant Hospital Events: Including procedures, antibiotic start and stop dates in addition to other pertinent events     Interim History / Subjective:  More awake this am. Denies pain or dyspnea. Remains on supplemental o2  Patient Lines/Drains/Airways Status     Active Line/Drains/Airways     Name Placement date Placement time Site Days   Peripheral IV 04/18/2023 20 G Right Antecubital 03/28/2023  0600  Antecubital  5   Peripheral IV 03/27/2023 18 G 1" Right Hand 04/08/2023  0730  Hand  3   Negative Pressure Wound Therapy Chest Medial 03/25/2023  1212  --  3   Urethral Catheter Rayne Du, RN Double-lumen;Latex 14 Fr. 04/21/23  1615  Double-lumen;Latex  1   Incision (Closed) 04/08/2023 Chest Other (Comment) 04/12/2023  1056  -- 3   Incision (Closed) 04/10/2023 Leg Right 03/29/2023  1058  -- 3   Incision - 4 Ports Abdomen Umbilicus Mid;Upper Right;Upper Right;Lower 10/22/21  0859  -- 547             Objective   Blood pressure 112/62, pulse 98, temperature 99.6 F (37.6 C), temperature source Axillary, resp. rate 18, height 5' 4.25" (1.632 m), weight 117.2 kg, SpO2 (!) 85%.    FiO2 (%):  [40 %] 40 % PEEP:  [5 cmH20] 5 cmH20 Pressure Support:  [5 cmH20] 5 cmH20   Intake/Output Summary (Last 24 hours) at 04/22/2023 4098 Last data filed at 04/22/2023 0600 Gross per 24 hour  Intake 328.33 ml  Output 1000 ml  Net -671.67 ml   Filed Weights   04/20/23 0410 04/21/23 0500 04/22/23 0500  Weight: 114.2 kg 114.3 kg 117.2 kg    Examination: No distress Mildly confused Moves to command Wound vac in place Foley straw urine Abd soft Globally weak Lungs diminished due to effort  Sugars ok Cr a little better CBC ok No new imaging  Resolved Hospital Problem list     Assessment &  Plan:  Coronary artery disease s/p  CABG x 2 Hypoxemia postop: related to obesity, deconditioning Post pump vasoplegia- resolved Acute metabolic encephalopathy- waxing/waning c/w ICU delirium HFpEF Postop anemia expected HTN HLD DM2 AKI, single kidney Hypothyroidism Class 3 obesity  - Encourage mobility - Encourage day/night cycles, family visits as able - Up in chair, IS - Diuretics vs. Fluids per TCTS, probably should just self equilibrate today unless runs into breathing issues - SSI - Add GDMT as able - Will follow while in ICU  Best Practice (right click and "Reselect all SmartList Selections" daily)   Diet/type: Regular consistency DVT prophylaxis: Enoxaparin GI prophylaxis: PPI Lines: Central line, Arterial Line, and yes and it is still needed Foley:  ?DC today, defer to TCTS Code Status:  full code Last date of multidisciplinary goals of care discussion [Per primary team]  Myrla Halsted MD [facility]  See Amion for personal pager PCCM on call pager [phone] until 7pm. Please call Elink 7p-7a. [phone]  04/22/2023 7:22 AM  Addendum: more agitated, pulling at vac, refusing meds, will start precedex and at bedtime seroquel, reorient as able  CC time 31 min

## 2023-04-22 NOTE — Plan of Care (Signed)
  Problem: Activity: Goal: Ability to return to baseline activity level will improve Outcome: Progressing   Problem: Cardiovascular: Goal: Ability to achieve and maintain adequate cardiovascular perfusion will improve Outcome: Progressing Goal: Vascular access site(s) Level 0-1 will be maintained Outcome: Progressing   Problem: Cardiac: Goal: Ability to achieve and maintain adequate cardiovascular perfusion will improve Outcome: Progressing   Problem: Coping: Goal: Ability to adjust to condition or change in health will improve Outcome: Progressing   Problem: Fluid Volume: Goal: Ability to maintain a balanced intake and output will improve Outcome: Progressing   Problem: Metabolic: Goal: Ability to maintain appropriate glucose levels will improve Outcome: Progressing   Problem: Tissue Perfusion: Goal: Adequacy of tissue perfusion will improve Outcome: Progressing   Problem: Clinical Measurements: Goal: Ability to maintain clinical measurements within normal limits will improve Outcome: Progressing Goal: Will remain free from infection Outcome: Progressing Goal: Diagnostic test results will improve Outcome: Progressing Goal: Respiratory complications will improve Outcome: Progressing Goal: Cardiovascular complication will be avoided Outcome: Progressing   Problem: Elimination: Goal: Will not experience complications related to bowel motility Outcome: Progressing Goal: Will not experience complications related to urinary retention Outcome: Progressing   Problem: Safety: Goal: Ability to remain free from injury will improve Outcome: Progressing   Problem: Cardiac: Goal: Will achieve and/or maintain hemodynamic stability Outcome: Progressing   Problem: Clinical Measurements: Goal: Postoperative complications will be avoided or minimized Outcome: Progressing   Problem: Respiratory: Goal: Respiratory status will improve Outcome: Progressing   Problem: Skin  Integrity: Goal: Wound healing without signs and symptoms of infection Outcome: Progressing   Problem: Urinary Elimination: Goal: Ability to achieve and maintain adequate renal perfusion and functioning will improve Outcome: Progressing   Problem: Cardiac: Goal: Will achieve and/or maintain hemodynamic stability Outcome: Progressing   Problem: Clinical Measurements: Goal: Postoperative complications will be avoided or minimized Outcome: Progressing   Problem: Respiratory: Goal: Respiratory status will improve Outcome: Progressing   Problem: Skin Integrity: Goal: Wound healing without signs and symptoms of infection Outcome: Progressing   Problem: Urinary Elimination: Goal: Ability to achieve and maintain adequate renal perfusion and functioning will improve Outcome: Progressing

## 2023-04-23 ENCOUNTER — Inpatient Hospital Stay (HOSPITAL_COMMUNITY): Payer: Medicare Other | Admitting: Certified Registered Nurse Anesthetist

## 2023-04-23 LAB — BPAM RBC
Blood Product Expiration Date: 202409042359
Blood Product Expiration Date: 202409062359
ISSUE DATE / TIME: 202409010257
ISSUE DATE / TIME: 202409010257
Unit Type and Rh: 1700
Unit Type and Rh: 1700

## 2023-04-23 LAB — TYPE AND SCREEN
ABO/RH(D): B NEG
Antibody Screen: NEGATIVE
Unit division: 0
Unit division: 0

## 2023-04-23 LAB — PREPARE RBC (CROSSMATCH)

## 2023-04-23 LAB — GLUCOSE, CAPILLARY: Glucose-Capillary: 142 mg/dL — ABNORMAL HIGH (ref 70–99)

## 2023-04-23 MED ORDER — SODIUM CHLORIDE 0.9% IV SOLUTION: Freq: Once | INTRAVENOUS | Status: DC

## 2023-04-23 MED ORDER — EPINEPHRINE 1 MG/10ML IJ SOSY
PREFILLED_SYRINGE | INTRAMUSCULAR | Status: AC | PRN
Start: 2023-04-23 — End: 2023-04-23
  Administered 2023-04-23: 1 mg via INTRAVENOUS

## 2023-04-23 MED ORDER — SODIUM BICARBONATE 8.4 % IV SOLN
INTRAVENOUS | Status: AC | PRN
Start: 2023-04-23 — End: 2023-04-23
  Administered 2023-04-23: 50 meq via INTRAVENOUS

## 2023-04-23 MED ORDER — AMIODARONE HCL 150 MG/3ML IV SOLN
INTRAVENOUS | Status: AC | PRN
Start: 2023-04-23 — End: 2023-04-23
  Administered 2023-04-23: 300 mg via INTRAVENOUS

## 2023-04-23 MED ORDER — NOREPINEPHRINE 4 MG/250ML-% IV SOLN
INTRAVENOUS | Status: AC
  Filled 2023-04-23: qty 250

## 2023-04-23 MED ORDER — EPINEPHRINE 1 MG/10ML IJ SOSY
PREFILLED_SYRINGE | INTRAMUSCULAR | Status: AC | PRN
Start: 2023-04-23 — End: 2023-04-23
  Administered 2023-04-23 (×2): 1 mg via INTRAVENOUS

## 2023-04-23 MED ORDER — EPINEPHRINE 1 MG/10ML IJ SOSY
PREFILLED_SYRINGE | INTRAMUSCULAR | Status: AC
  Filled 2023-04-23: qty 40

## 2023-04-23 DEATH — deceased

## 2023-04-28 ENCOUNTER — Telehealth: Payer: Medicare Other | Admitting: Thoracic Surgery (Cardiothoracic Vascular Surgery)

## 2023-04-28 MED FILL — Electrolyte-R (PH 7.4) Solution: INTRAVENOUS | Qty: 7000 | Status: AC

## 2023-04-28 MED FILL — Calcium Chloride Inj 10%: INTRAVENOUS | Qty: 10 | Status: AC

## 2023-04-28 MED FILL — Sodium Bicarbonate IV Soln 8.4%: INTRAVENOUS | Qty: 50 | Status: AC

## 2023-04-28 MED FILL — Heparin Sodium (Porcine) Inj 1000 Unit/ML: INTRAMUSCULAR | Qty: 10 | Status: AC

## 2023-04-28 MED FILL — Sodium Chloride IV Soln 0.9%: INTRAVENOUS | Qty: 2000 | Status: AC

## 2023-05-23 NOTE — Progress Notes (Signed)
   05/22/2023 0351  Spiritual Encounters  Type of Visit Attempt (pt unavailable)  Care provided to: Pt not available  Referral source Code page  Reason for visit Code  OnCall Visit Yes   Chaplain responded to code blue. Patient did not survive. Family was contacted but chaplain was informed that they were "out of town" and that no one would be coming.  Arlyce Dice, Chaplain Resident [phone]

## 2023-05-23 NOTE — Code Documentation (Signed)
PATIENT NAME: Debra Jordan MEDICAL RECORD NUMBER: [id number] Birthday: 09-14-1950  Age: 72 y.o. Admit Date: 04/17/2023  Attending Provider: Dr.Lightfoot  Indication: PEA  Technical Description:  CPR performance duration: 40 min Was defibrillation or cardioversion used ? Yes Was external pacer placed ? no Was patient intubated pre/post CPR ? during Was transvenous pacer placed ? No  Medications Administered Include      Yes/no Amiodarone Y  Atropin N  Calcium Y  Epinephrine Y  Lidocaine N  Magnesium N  Norepinephrine infusion  Phenylephrine no  Sodium bicarbonate yes  Vasopression no   Evaluation Final Status - Was patient successfully resuscitated ? no If successfully resuscitated - what is current rhythm ? na If successfully resuscitated - what is current hemodynamic status ? na  Miscellaneous Information  72 year old female POD 3 CABG. Had been improving. Lingering weakness and delirium. Complained of chest pain to RN. EKG was done showing ST elevation in the lateral leads. Immediately after EKG was done the patient became unresponsive and lost pulses. PEA/asystole. 40 mins total downtime. See code sheet for official details. Unable to resuscitate. Time of death 0317   Joneen Roach, AGACNP-BC [facility]  See Amion for personal pager PCCM on call pager [phone] until 7pm. Please call Elink 7p-7a. [phone]  05/06/2023 3:34 AM

## 2023-05-23 NOTE — Anesthesia Procedure Notes (Signed)
Procedure Name: Intubation Date/Time: 05/18/2023 2:42 AM  Performed by: Alease Medina, CRNAPre-anesthesia Checklist: Patient identified, Emergency Drugs available, Suction available and Patient being monitored Patient Re-evaluated:Patient Re-evaluated prior to induction Oxygen Delivery Method: Circle system utilized Preoxygenation: Pre-oxygenation with 100% oxygen Induction Type: IV induction Ventilation: Mask ventilation without difficulty Laryngoscope Size: Glidescope and 3 Grade View: Grade I Tube type: Oral Tube size: 7.5 mm Number of attempts: 2 Airway Equipment and Method: Stylet and Oral airway Placement Confirmation: ETT inserted through vocal cords under direct vision, positive ETCO2 and breath sounds checked- equal and bilateral Secured at: 21 cm Tube secured with: Tape Dental Injury: Teeth and Oropharynx as per pre-operative assessment  Comments: DL x1 Grade 3 view with Mac 3 blade no attempt, switched to glidescope. DL x1 with S3 Grade 1 view successful intubation

## 2023-05-23 NOTE — Progress Notes (Signed)
Patient ID: Debra Jordan, female   DOB: April 01, 1951, 72 y.o.   MRN: [id number] TCTS  Pt had been hemodynamically stable this evening with persistent delirium, complained of chest pain around 2 am and given oxycodone. Around 2:35 suddenly became apneic with rhythm and BP present and code called. Intubated by anesthesia. Resuscitation per ACLS protocol by CCM team. Sinus rhythm and low BP returned but then lost BP again with bradycardia that deteriorated to VF. Shocked and CPR performed again with return of sinus rhythm and low BP not responsive to high dose NE. Echo shows minimal cardiac activity. She became bradycardic again and went asystole. Code called. Patient's son, Debra Jordan notified.

## 2023-05-23 NOTE — Progress Notes (Signed)
RT NOTE:  Pt intubated by CRNA during Code blue. 7.5 ETT secured @ 21 cm. Resuscitative efforts stopped @ 0317. ETT remains in place.

## 2023-05-23 NOTE — Progress Notes (Signed)
10 mg oxycodone wasted with [facility] RN

## 2023-05-23 NOTE — Progress Notes (Addendum)
Approximately 0215 this morning patient was becoming more restless, throwing legs and arms around. Patient yelling "I'm hurting, help me" Asked patient where her pain was, patient pointed to her chest. 10 mg oxycodone was pulled but not given due to patient going unresponsive. Prior to event patient was A O to self, VSS, resting in bed but moaning intermittently. See Provider post code note.

## 2023-05-23 DEATH — deceased

## 2023-07-06 ENCOUNTER — Encounter: Payer: Medicare Other | Admitting: Psychology

## 2023-07-13 ENCOUNTER — Encounter: Payer: Medicare Other | Admitting: Psychology

## 2023-09-22 IMAGING — MR MR ABDOMEN WO/W CM MRCP
19 of 25 series · 42 of 48 positions shown · IV contrast (gadavist)
Comparison: No priors.

CLINICAL DATA: 70-year-old female with clinically suspected biliary
tract obstruction.

EXAM:
MRI ABDOMEN WITHOUT AND WITH CONTRAST (INCLUDING MRCP)
TECHNIQUE: Multiplanar multisequence MR imaging of the abdomen was performed
both before and after the administration of intravenous contrast.
Heavily T2-weighted images of the biliary and pancreatic ducts were
obtained, and three-dimensional MRCP images were rendered by post
processing.
CONTRAST:  9mL GADAVIST GADOBUTROL 1 MMOL/ML IV SOLN

[Series 4: ax haste · axial · 6.0mm · 1.41mm/px · 1 of 48 slices shown]
[im 1/48]
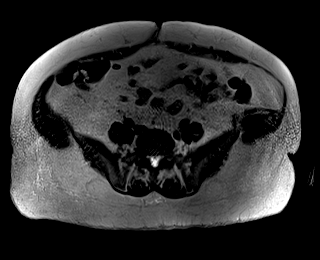

[Series 5: cor haste · coronal · 6.0mm · 1.38mm/px · 1 of 44 slices shown]
[im 1/44]
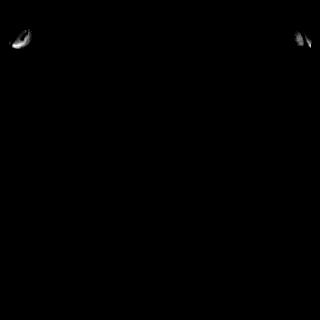

[Series 8: T2 fat-sat · axial · 6.0mm · 1.38mm/px · 1 of 44 slices shown]
[im 1/44]
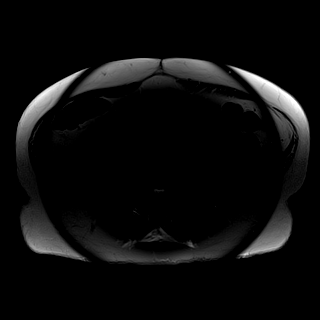

[Series 9: DWI · axial · 6.0mm · 1.72mm/px · z∈[-123,+194]mm · 2 of 131 slices shown (1 of 2)]
[im 1/131]
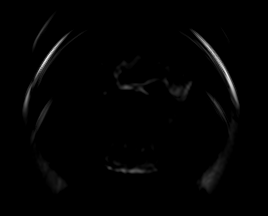
[im 131/131]
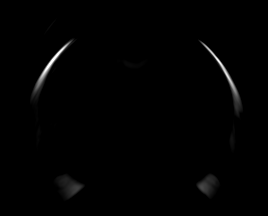

[Series 10: DWI · axial · 6.0mm · 1.72mm/px · 1 of 45 slices shown (2 of 2)]
[im 1/45]
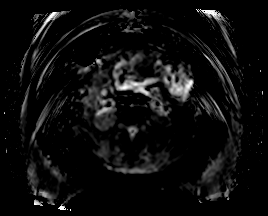

[Series 11: ax in and · axial · 3.0mm · 1.44mm/px · z∈[-121,+188]mm · 2 of 104 slices shown (1 of 2)]
[im 1/104]
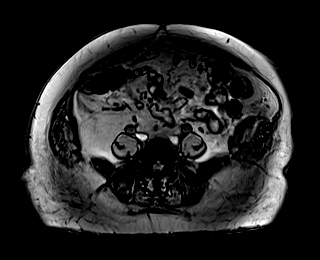
[im 104/104]
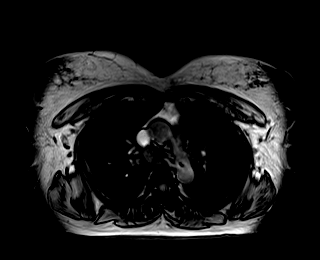

[Series 11: ax in and · axial · 3.0mm · 1.44mm/px · z∈[-121,+188]mm · 2 of 104 slices shown (2 of 2)]
[im 1/104]
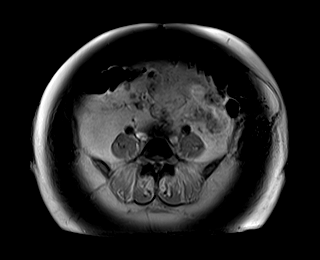
[im 104/104]
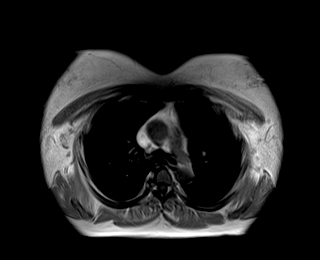

[Series 16: MRCP · coronal · 4.0mm · 1.44mm/px · 1 of 22 slices shown]
[im 1/22]
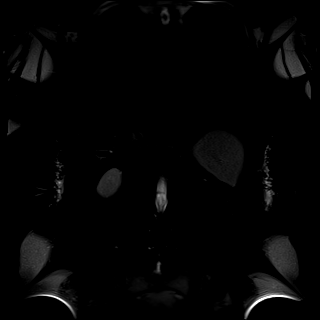

[Series 17: radials · coronal · 50.0mm · 0.89mm/px · 1 of 5 slices shown]
[im 1/5]
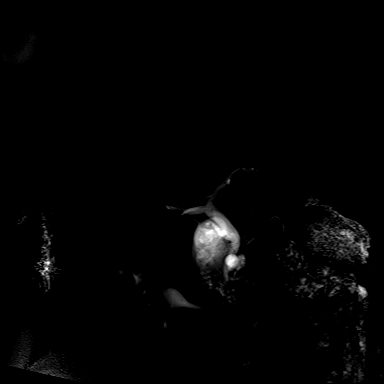

[Series 18: T1 dynamic · axial · non-contrast · 3.0mm · 1.44mm/px · z∈[-119,+190]mm · 3 of 104 slices shown (1 of 5)]
[im 1/104]
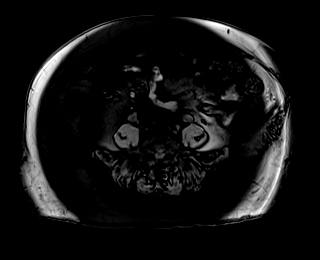
[im 52/104]
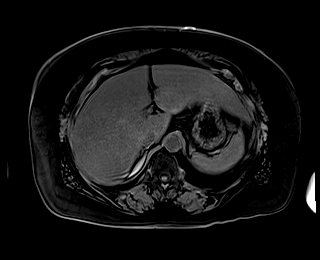
[im 104/104]
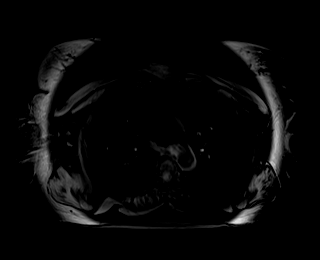

[Series 20: T1 dynamic post-contrast · axial · 3.0mm · 1.44mm/px · z∈[-119,+190]mm · 3 of 104 slices shown (1 of 5)]
[im 1/104]
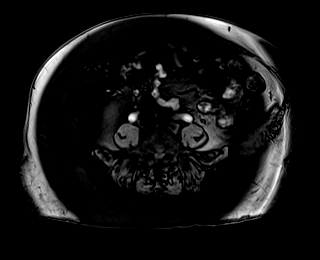
[im 52/104]
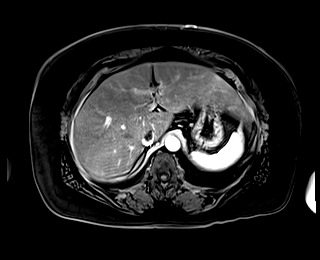
[im 104/104]
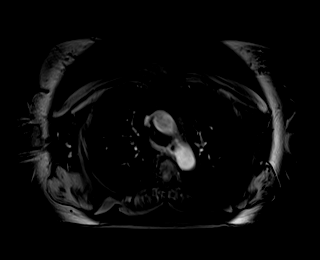

[Series 21: T1 dynamic · axial · 3.0mm · 1.44mm/px · z∈[-119,+190]mm · 3 of 104 slices shown (2 of 5)]
[im 1/104]
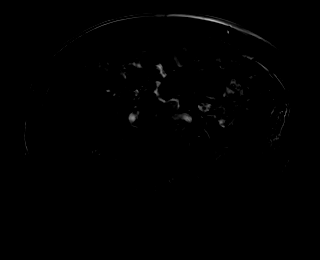
[im 52/104]
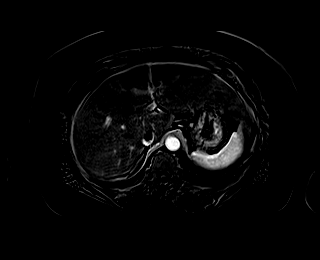
[im 104/104]
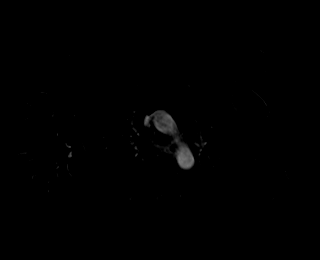

[Series 22: T1 dynamic post-contrast · axial · 3.0mm · 1.44mm/px · z∈[-119,+190]mm · 3 of 104 slices shown (2 of 5)]
[im 1/104]
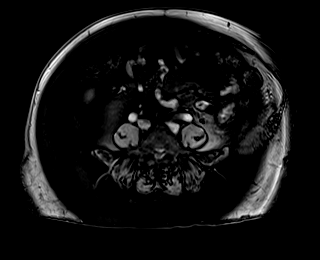
[im 52/104]
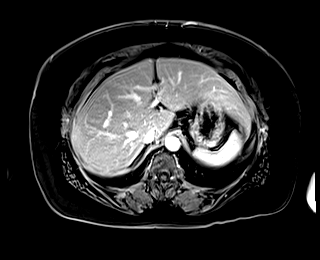
[im 104/104]
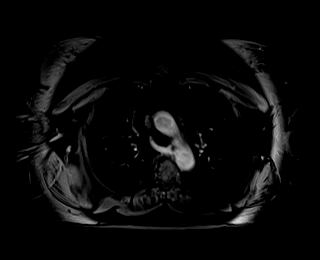

[Series 23: T1 dynamic · axial · 3.0mm · 1.44mm/px · z∈[-119,+190]mm · 3 of 104 slices shown (3 of 5)]
[im 1/104]
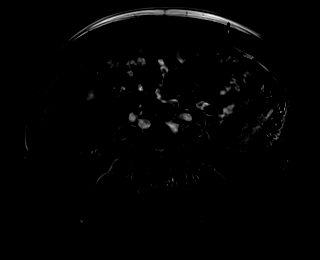
[im 52/104]
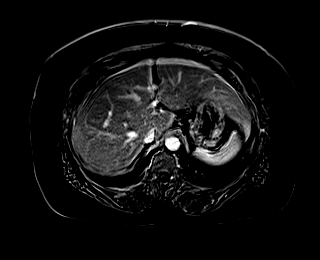
[im 104/104]
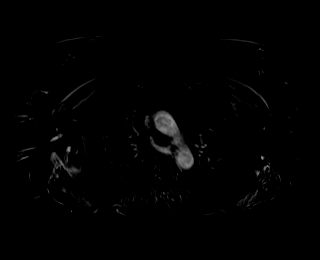

[Series 24: T1 dynamic post-contrast · axial · 3.0mm · 1.44mm/px · z∈[-119,+190]mm · 3 of 104 slices shown (3 of 5)]
[im 1/104]
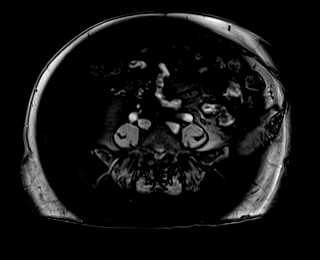
[im 52/104]
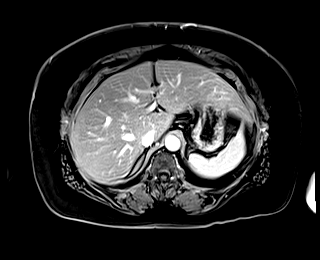
[im 104/104]
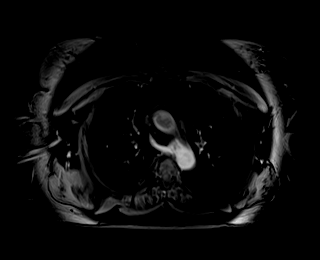

[Series 25: T1 dynamic · axial · 3.0mm · 1.44mm/px · z∈[-119,+190]mm · 3 of 104 slices shown (4 of 5)]
[im 1/104]
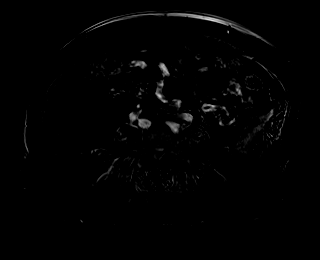
[im 52/104]
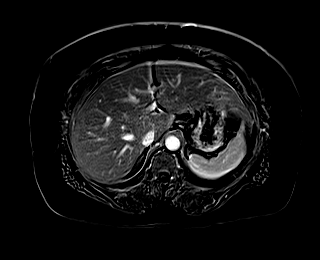
[im 104/104]
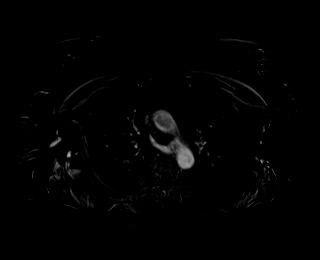

[Series 26: T1 dynamic post-contrast · axial · 3.0mm · 1.44mm/px · z∈[-119,+190]mm · 3 of 104 slices shown (4 of 5)]
[im 1/104]
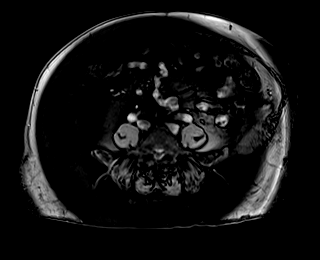
[im 52/104]
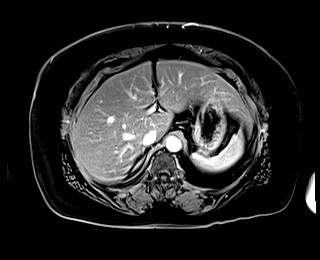
[im 104/104]
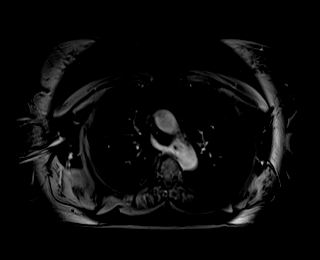

[Series 27: T1 dynamic · axial · 3.0mm · 1.44mm/px · z∈[-119,+190]mm · 3 of 104 slices shown (5 of 5)]
[im 1/104]
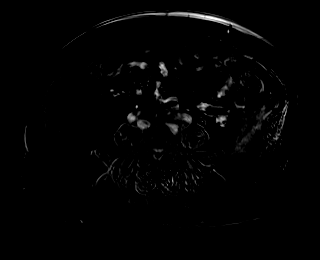
[im 52/104]
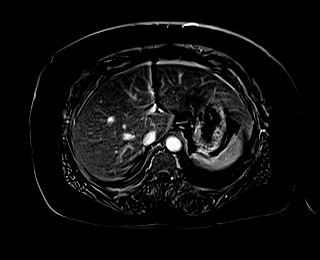
[im 104/104]
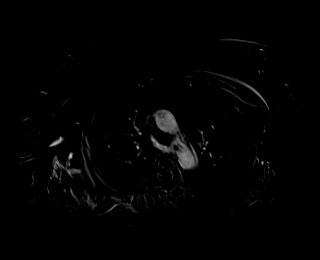

[Series 28: T1 dynamic post-contrast · coronal · 3.0mm · 1.44mm/px · 3 of 112 slices shown (5 of 5)]
[im 1/112]
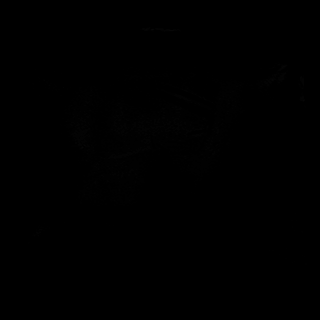
[im 56/112]
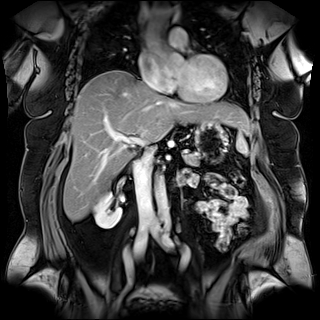
[im 112/112]
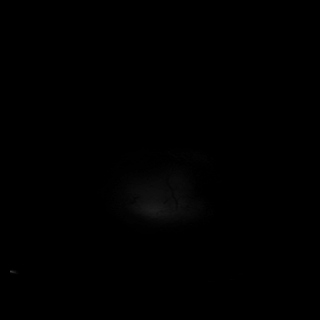

[42 of 48 positions shown; findings below may reference images not displayed]

FINDINGS: Lower chest: Unremarkable.

Hepatobiliary: Diffuse loss of signal intensity on out of phase dual
echo images, indicative of severe hepatic steatosis. Some areas of
focal fatty sparing are noted, most evident adjacent to the
gallbladder fossa. No suspicious cystic or solid hepatic lesions. No
intrahepatic biliary ductal dilatation. Common bile duct is mildly
dilated measuring up to 9 mm in the porta hepatis. However, there
are no filling defects within the common bile duct to suggest the
presence of choledocholithiasis. Small filling defects lying
dependently in the gallbladder compatible with tiny gallstones.
Gallbladder is not distended. Gallbladder wall thickness is normal.
No gallbladder wall thickening or pericholecystic fluid or
inflammatory changes.

Pancreas: No solid pancreatic mass. No pancreatic ductal dilatation
noted on MRCP images. In the posterior aspect of the pancreatic head
there are 2 adjacent T1 hypointense, T2 hyperintense, nonenhancing
lesions, without definite communication with the adjacent pancreatic
duct, largest of which measures 1.4 cm (coronal image 25 of series
5). No other peripancreatic fluid collections or inflammatory
changes.

Spleen:  Unremarkable.

Adrenals/Urinary Tract: Left kidney is not visualized, either
surgically or congenitally absent. Right kidney and bilateral
adrenal glands are normal in appearance. No right
hydroureteronephrosis.

Stomach/Bowel: Visualized portions are unremarkable.

Vascular/Lymphatic: No aneurysm identified in the visualized
abdominal vasculature. No lymphadenopathy noted in the abdomen.

Other: No significant volume of ascites noted in the visualized
portions of the peritoneal cavity.

Musculoskeletal: No aggressive appearing osseous lesions are noted
in the visualized portions of the skeleton.
IMPRESSION: 1. Study is positive for cholelithiasis, but there is no evidence of
acute cholecystitis at this time.
2. Very mild common bile duct dilatation measuring up to 9 mm in the
porta hepatis. No intrahepatic biliary ductal dilatation to suggest
true biliary tract obstruction at this time. Additionally, there is
no evidence of obstructing pancreatic head mass or
choledocholithiasis.
3. Severe hepatic steatosis.
4. Small cystic lesions in the posterior aspect of the head of the
pancreas, which have a benign appearance, favored to represent tiny
pancreatic pseudocysts or side branch IPMN (intraductal papillary
mucinous neoplasm). Recommend follow up pre and post contrast
MRI/MRCP or pancreatic protocol CT in 2 years. This recommendation
follows ACR consensus guidelines: Management of Incidental
Pancreatic Cysts: A White Paper of the ACR Incidental Findings
Committee. [HOSPITAL] 1900;[DATE].
